# Patient Record
Sex: Male | Born: 1940 | Race: White | Hispanic: No | Marital: Married | Smoking: Former smoker
Health system: Southern US, Community
[De-identification: ages and names within clinical notes are randomized; demographics above are authoritative.]

## PROBLEM LIST (undated history)

## (undated) DIAGNOSIS — E785 Hyperlipidemia, unspecified: Secondary | ICD-10-CM

## (undated) DIAGNOSIS — I10 Essential (primary) hypertension: Secondary | ICD-10-CM

## (undated) DIAGNOSIS — E119 Type 2 diabetes mellitus without complications: Secondary | ICD-10-CM

## (undated) DIAGNOSIS — K5790 Diverticulosis of intestine, part unspecified, without perforation or abscess without bleeding: Secondary | ICD-10-CM

## (undated) HISTORY — DX: Essential (primary) hypertension: I10

## (undated) HISTORY — DX: Diverticulosis of intestine, part unspecified, without perforation or abscess without bleeding: K57.90

## (undated) HISTORY — DX: Hyperlipidemia, unspecified: E78.5

## (undated) HISTORY — PX: TONSILLECTOMY: SUR1361

## (undated) HISTORY — PX: ACHILLES TENDON SURGERY: SHX542

## (undated) HISTORY — DX: Type 2 diabetes mellitus without complications: E11.9

---

## 2003-05-14 ENCOUNTER — Encounter: Admission: RE | Admit: 2003-05-14 | Discharge: 2003-08-12 | Payer: Self-pay | Admitting: Family Medicine

## 2003-07-30 ENCOUNTER — Encounter: Admission: RE | Admit: 2003-07-30 | Discharge: 2003-10-28 | Payer: Self-pay | Admitting: Family Medicine

## 2006-05-11 ENCOUNTER — Ambulatory Visit: Payer: Self-pay | Admitting: Family Medicine

## 2006-06-20 ENCOUNTER — Ambulatory Visit: Payer: Self-pay | Admitting: Family Medicine

## 2006-06-20 ENCOUNTER — Ambulatory Visit: Payer: Self-pay | Admitting: Gastroenterology

## 2006-06-30 ENCOUNTER — Ambulatory Visit: Payer: Self-pay | Admitting: Gastroenterology

## 2006-06-30 ENCOUNTER — Encounter (INDEPENDENT_AMBULATORY_CARE_PROVIDER_SITE_OTHER): Payer: Self-pay | Admitting: *Deleted

## 2006-08-17 ENCOUNTER — Ambulatory Visit: Payer: Self-pay | Admitting: Family Medicine

## 2006-10-18 ENCOUNTER — Ambulatory Visit: Payer: Self-pay | Admitting: Family Medicine

## 2006-10-18 LAB — CONVERTED CEMR LAB
ALT: 34 units/L (ref 0–40)
AST: 34 units/L (ref 0–37)
CO2: 26 meq/L (ref 19–32)
Calcium: 9.3 mg/dL (ref 8.4–10.5)
Chloride: 105 meq/L (ref 96–112)
Chol/HDL Ratio, serum: 3.7
Glomerular Filtration Rate, Af Am: 97 mL/min/{1.73_m2}
HDL: 31.6 mg/dL — ABNORMAL LOW (ref >39.0)
Hemoglobin: 15.3 g/dL (ref 13.0–17.0)
LDL DIRECT: 60.5 mg/dL
Potassium: 3.8 meq/L (ref 3.5–5.1)
Sodium: 138 meq/L (ref 135–145)
TSH: 2.05 microintl units/mL (ref 0.35–5.50)
Testosterone, total: 1.7122 ng/mL — ABNORMAL LOW
Triglyceride fasting, serum: 201 mg/dL (ref 0–149)
VLDL: 40 mg/dL (ref 0–40)

## 2007-01-17 ENCOUNTER — Ambulatory Visit: Payer: Self-pay | Admitting: Family Medicine

## 2007-01-17 LAB — CONVERTED CEMR LAB
AST: 23 units/L (ref 0–37)
BUN: 14 mg/dL (ref 6–23)
Creatinine, Ser: 0.9 mg/dL (ref 0.4–1.5)
GFR calc non Af Amer: 90 mL/min
HDL: 37.7 mg/dL — ABNORMAL LOW (ref >39.0)
TSH: 2.43 microintl units/mL (ref 0.35–5.50)
Total CHOL/HDL Ratio: 3.2

## 2007-01-18 DIAGNOSIS — E785 Hyperlipidemia, unspecified: Secondary | ICD-10-CM

## 2007-01-18 DIAGNOSIS — I10 Essential (primary) hypertension: Secondary | ICD-10-CM

## 2007-01-18 DIAGNOSIS — K5730 Diverticulosis of large intestine without perforation or abscess without bleeding: Secondary | ICD-10-CM | POA: Insufficient documentation

## 2007-01-18 DIAGNOSIS — D126 Benign neoplasm of colon, unspecified: Secondary | ICD-10-CM

## 2007-01-18 DIAGNOSIS — E119 Type 2 diabetes mellitus without complications: Secondary | ICD-10-CM | POA: Insufficient documentation

## 2007-02-01 ENCOUNTER — Ambulatory Visit: Payer: Self-pay | Admitting: Family Medicine

## 2007-05-01 ENCOUNTER — Ambulatory Visit: Payer: Self-pay | Admitting: Family Medicine

## 2007-05-01 LAB — CONVERTED CEMR LAB
AST: 21 units/L (ref 0–37)
Calcium: 9 mg/dL (ref 8.4–10.5)
Chloride: 102 meq/L (ref 96–112)
Cholesterol: 109 mg/dL (ref 0–200)
Creatinine, Ser: 1 mg/dL (ref 0.4–1.5)
Creatinine,U: 119.2 mg/dL
GFR calc non Af Amer: 80 mL/min
Hgb A1c MFr Bld: 5.7 % (ref 4.6–6.0)
Microalb, Ur: 0.2 mg/dL (ref 0.0–1.9)
Potassium: 4.3 meq/L (ref 3.5–5.1)
Sodium: 142 meq/L (ref 135–145)

## 2007-05-08 ENCOUNTER — Ambulatory Visit: Payer: Self-pay | Admitting: Family Medicine

## 2007-06-08 ENCOUNTER — Ambulatory Visit: Payer: Self-pay | Admitting: Family Medicine

## 2007-06-13 ENCOUNTER — Encounter (INDEPENDENT_AMBULATORY_CARE_PROVIDER_SITE_OTHER): Payer: Self-pay | Admitting: Family Medicine

## 2007-06-21 ENCOUNTER — Ambulatory Visit: Payer: Self-pay | Admitting: Family Medicine

## 2007-06-22 ENCOUNTER — Telehealth (INDEPENDENT_AMBULATORY_CARE_PROVIDER_SITE_OTHER): Payer: Self-pay | Admitting: *Deleted

## 2007-06-22 LAB — CONVERTED CEMR LAB
BUN: 13 mg/dL (ref 6–23)
CO2: 27 meq/L (ref 19–32)
Calcium: 9.2 mg/dL (ref 8.4–10.5)
Chloride: 106 meq/L (ref 96–112)
GFR calc non Af Amer: 103 mL/min
Glucose, Bld: 149 mg/dL — ABNORMAL HIGH (ref 70–99)
Potassium: 4.1 meq/L (ref 3.5–5.1)
Sodium: 140 meq/L (ref 135–145)

## 2007-08-18 ENCOUNTER — Ambulatory Visit: Payer: Self-pay | Admitting: Family Medicine

## 2007-09-12 ENCOUNTER — Encounter (INDEPENDENT_AMBULATORY_CARE_PROVIDER_SITE_OTHER): Payer: Self-pay | Admitting: Family Medicine

## 2007-09-13 ENCOUNTER — Telehealth (INDEPENDENT_AMBULATORY_CARE_PROVIDER_SITE_OTHER): Payer: Self-pay | Admitting: *Deleted

## 2007-09-21 ENCOUNTER — Telehealth (INDEPENDENT_AMBULATORY_CARE_PROVIDER_SITE_OTHER): Payer: Self-pay | Admitting: *Deleted

## 2007-09-21 ENCOUNTER — Ambulatory Visit: Payer: Self-pay | Admitting: Family Medicine

## 2007-09-25 ENCOUNTER — Telehealth (INDEPENDENT_AMBULATORY_CARE_PROVIDER_SITE_OTHER): Payer: Self-pay | Admitting: *Deleted

## 2007-09-28 ENCOUNTER — Encounter (INDEPENDENT_AMBULATORY_CARE_PROVIDER_SITE_OTHER): Payer: Self-pay | Admitting: Family Medicine

## 2007-10-02 ENCOUNTER — Encounter (INDEPENDENT_AMBULATORY_CARE_PROVIDER_SITE_OTHER): Payer: Self-pay | Admitting: Family Medicine

## 2007-11-01 ENCOUNTER — Ambulatory Visit (HOSPITAL_COMMUNITY): Admission: RE | Admit: 2007-11-01 | Discharge: 2007-11-02 | Payer: Self-pay | Admitting: Specialist

## 2007-11-06 ENCOUNTER — Telehealth (INDEPENDENT_AMBULATORY_CARE_PROVIDER_SITE_OTHER): Payer: Self-pay | Admitting: Family Medicine

## 2007-11-08 ENCOUNTER — Ambulatory Visit: Payer: Self-pay | Admitting: Family Medicine

## 2007-11-08 ENCOUNTER — Telehealth (INDEPENDENT_AMBULATORY_CARE_PROVIDER_SITE_OTHER): Payer: Self-pay | Admitting: *Deleted

## 2007-11-10 ENCOUNTER — Telehealth (INDEPENDENT_AMBULATORY_CARE_PROVIDER_SITE_OTHER): Payer: Self-pay | Admitting: *Deleted

## 2007-11-10 LAB — CONVERTED CEMR LAB
Chloride: 101 meq/L (ref 96–112)
Cholesterol: 110 mg/dL (ref 0–200)
Creatinine, Ser: 1 mg/dL (ref 0.4–1.5)
Glucose, Bld: 152 mg/dL — ABNORMAL HIGH (ref 70–99)
LDL Cholesterol: 50 mg/dL (ref 0–99)
Potassium: 4.1 meq/L (ref 3.5–5.1)
Sodium: 139 meq/L (ref 135–145)
VLDL: 30 mg/dL (ref 0–40)

## 2007-11-13 ENCOUNTER — Ambulatory Visit: Payer: Self-pay | Admitting: Family Medicine

## 2007-11-15 ENCOUNTER — Ambulatory Visit: Payer: Self-pay | Admitting: Family Medicine

## 2007-11-23 ENCOUNTER — Telehealth (INDEPENDENT_AMBULATORY_CARE_PROVIDER_SITE_OTHER): Payer: Self-pay | Admitting: *Deleted

## 2007-11-27 ENCOUNTER — Telehealth (INDEPENDENT_AMBULATORY_CARE_PROVIDER_SITE_OTHER): Payer: Self-pay | Admitting: Family Medicine

## 2007-11-27 ENCOUNTER — Encounter (INDEPENDENT_AMBULATORY_CARE_PROVIDER_SITE_OTHER): Payer: Self-pay | Admitting: Family Medicine

## 2007-11-30 ENCOUNTER — Ambulatory Visit: Payer: Self-pay | Admitting: Family Medicine

## 2007-12-08 ENCOUNTER — Encounter (INDEPENDENT_AMBULATORY_CARE_PROVIDER_SITE_OTHER): Payer: Self-pay | Admitting: Family Medicine

## 2007-12-12 ENCOUNTER — Telehealth (INDEPENDENT_AMBULATORY_CARE_PROVIDER_SITE_OTHER): Payer: Self-pay | Admitting: *Deleted

## 2007-12-18 ENCOUNTER — Ambulatory Visit: Payer: Self-pay | Admitting: Family Medicine

## 2007-12-23 ENCOUNTER — Encounter (INDEPENDENT_AMBULATORY_CARE_PROVIDER_SITE_OTHER): Payer: Self-pay | Admitting: Family Medicine

## 2007-12-25 ENCOUNTER — Telehealth (INDEPENDENT_AMBULATORY_CARE_PROVIDER_SITE_OTHER): Payer: Self-pay | Admitting: *Deleted

## 2007-12-26 ENCOUNTER — Encounter (INDEPENDENT_AMBULATORY_CARE_PROVIDER_SITE_OTHER): Payer: Self-pay | Admitting: *Deleted

## 2008-01-16 ENCOUNTER — Encounter: Payer: Self-pay | Admitting: Family Medicine

## 2008-04-03 ENCOUNTER — Encounter (INDEPENDENT_AMBULATORY_CARE_PROVIDER_SITE_OTHER): Payer: Self-pay | Admitting: Internal Medicine

## 2008-04-03 ENCOUNTER — Telehealth (INDEPENDENT_AMBULATORY_CARE_PROVIDER_SITE_OTHER): Payer: Self-pay | Admitting: *Deleted

## 2008-04-05 ENCOUNTER — Encounter: Payer: Self-pay | Admitting: Family Medicine

## 2008-05-27 ENCOUNTER — Encounter: Payer: Self-pay | Admitting: Family Medicine

## 2008-06-04 ENCOUNTER — Telehealth (INDEPENDENT_AMBULATORY_CARE_PROVIDER_SITE_OTHER): Payer: Self-pay | Admitting: *Deleted

## 2008-06-05 ENCOUNTER — Telehealth (INDEPENDENT_AMBULATORY_CARE_PROVIDER_SITE_OTHER): Payer: Self-pay | Admitting: *Deleted

## 2008-06-07 ENCOUNTER — Telehealth (INDEPENDENT_AMBULATORY_CARE_PROVIDER_SITE_OTHER): Payer: Self-pay | Admitting: *Deleted

## 2008-06-11 ENCOUNTER — Telehealth (INDEPENDENT_AMBULATORY_CARE_PROVIDER_SITE_OTHER): Payer: Self-pay | Admitting: *Deleted

## 2008-06-11 ENCOUNTER — Ambulatory Visit: Payer: Self-pay | Admitting: Family Medicine

## 2008-06-11 DIAGNOSIS — J018 Other acute sinusitis: Secondary | ICD-10-CM

## 2008-07-15 ENCOUNTER — Telehealth (INDEPENDENT_AMBULATORY_CARE_PROVIDER_SITE_OTHER): Payer: Self-pay | Admitting: *Deleted

## 2008-08-02 ENCOUNTER — Ambulatory Visit: Payer: Self-pay | Admitting: Family Medicine

## 2008-08-02 DIAGNOSIS — M545 Low back pain: Secondary | ICD-10-CM

## 2008-08-02 DIAGNOSIS — S139XXA Sprain of joints and ligaments of unspecified parts of neck, initial encounter: Secondary | ICD-10-CM

## 2008-09-10 ENCOUNTER — Ambulatory Visit: Payer: Self-pay | Admitting: Family Medicine

## 2008-09-11 ENCOUNTER — Encounter: Payer: Self-pay | Admitting: Family Medicine

## 2008-09-24 ENCOUNTER — Encounter: Payer: Self-pay | Admitting: Family Medicine

## 2008-09-26 ENCOUNTER — Telehealth (INDEPENDENT_AMBULATORY_CARE_PROVIDER_SITE_OTHER): Payer: Self-pay | Admitting: *Deleted

## 2008-09-29 LAB — CONVERTED CEMR LAB
Albumin: 4.2 g/dL (ref 3.5–5.2)
Alkaline Phosphatase: 65 units/L (ref 39–117)
BUN: 15 mg/dL (ref 6–23)
Basophils Relative: 0.1 % (ref 0.0–3.0)
CO2: 30 meq/L (ref 19–32)
Calcium: 9.6 mg/dL (ref 8.4–10.5)
Eosinophils Absolute: 0.3 10*3/uL (ref 0.0–0.7)
Eosinophils Relative: 3.2 % (ref 0.0–5.0)
GFR calc Af Amer: 96 mL/min
GFR calc non Af Amer: 79 mL/min
Glucose, Bld: 104 mg/dL — ABNORMAL HIGH (ref 70–99)
HCT: 45.9 % (ref 39.0–52.0)
Hemoglobin: 16.2 g/dL (ref 13.0–17.0)
Lymphocytes Relative: 27.4 % (ref 12.0–46.0)
Monocytes Absolute: 1 10*3/uL (ref 0.1–1.0)
Monocytes Relative: 9.7 % (ref 3.0–12.0)
PSA: 3.82 ng/mL (ref 0.10–4.00)
Platelets: 206 10*3/uL (ref 150–400)
Potassium: 3.7 meq/L (ref 3.5–5.1)
Total Protein: 7.4 g/dL (ref 6.0–8.3)

## 2008-10-01 ENCOUNTER — Encounter (INDEPENDENT_AMBULATORY_CARE_PROVIDER_SITE_OTHER): Payer: Self-pay | Admitting: *Deleted

## 2008-10-02 ENCOUNTER — Telehealth (INDEPENDENT_AMBULATORY_CARE_PROVIDER_SITE_OTHER): Payer: Self-pay | Admitting: *Deleted

## 2008-11-08 ENCOUNTER — Telehealth (INDEPENDENT_AMBULATORY_CARE_PROVIDER_SITE_OTHER): Payer: Self-pay | Admitting: *Deleted

## 2008-12-25 ENCOUNTER — Telehealth (INDEPENDENT_AMBULATORY_CARE_PROVIDER_SITE_OTHER): Payer: Self-pay | Admitting: *Deleted

## 2008-12-26 ENCOUNTER — Telehealth (INDEPENDENT_AMBULATORY_CARE_PROVIDER_SITE_OTHER): Payer: Self-pay | Admitting: *Deleted

## 2009-02-11 ENCOUNTER — Telehealth (INDEPENDENT_AMBULATORY_CARE_PROVIDER_SITE_OTHER): Payer: Self-pay | Admitting: *Deleted

## 2009-02-27 ENCOUNTER — Telehealth (INDEPENDENT_AMBULATORY_CARE_PROVIDER_SITE_OTHER): Payer: Self-pay | Admitting: *Deleted

## 2009-03-18 ENCOUNTER — Encounter: Payer: Self-pay | Admitting: Family Medicine

## 2009-04-08 ENCOUNTER — Telehealth (INDEPENDENT_AMBULATORY_CARE_PROVIDER_SITE_OTHER): Payer: Self-pay | Admitting: *Deleted

## 2009-05-01 ENCOUNTER — Telehealth (INDEPENDENT_AMBULATORY_CARE_PROVIDER_SITE_OTHER): Payer: Self-pay | Admitting: *Deleted

## 2009-05-19 ENCOUNTER — Encounter: Payer: Self-pay | Admitting: Family Medicine

## 2009-05-28 ENCOUNTER — Telehealth (INDEPENDENT_AMBULATORY_CARE_PROVIDER_SITE_OTHER): Payer: Self-pay | Admitting: *Deleted

## 2009-07-17 ENCOUNTER — Telehealth (INDEPENDENT_AMBULATORY_CARE_PROVIDER_SITE_OTHER): Payer: Self-pay | Admitting: *Deleted

## 2009-07-18 ENCOUNTER — Ambulatory Visit: Payer: Self-pay | Admitting: Family Medicine

## 2009-09-17 ENCOUNTER — Telehealth (INDEPENDENT_AMBULATORY_CARE_PROVIDER_SITE_OTHER): Payer: Self-pay | Admitting: *Deleted

## 2009-09-18 ENCOUNTER — Encounter: Payer: Self-pay | Admitting: Family Medicine

## 2009-09-22 ENCOUNTER — Telehealth (INDEPENDENT_AMBULATORY_CARE_PROVIDER_SITE_OTHER): Payer: Self-pay | Admitting: *Deleted

## 2009-10-31 ENCOUNTER — Telehealth: Payer: Self-pay | Admitting: Family Medicine

## 2009-11-05 ENCOUNTER — Telehealth (INDEPENDENT_AMBULATORY_CARE_PROVIDER_SITE_OTHER): Payer: Self-pay | Admitting: *Deleted

## 2009-11-27 ENCOUNTER — Ambulatory Visit: Payer: Self-pay | Admitting: Family Medicine

## 2009-11-30 DIAGNOSIS — R972 Elevated prostate specific antigen [PSA]: Secondary | ICD-10-CM | POA: Insufficient documentation

## 2009-12-01 ENCOUNTER — Encounter (INDEPENDENT_AMBULATORY_CARE_PROVIDER_SITE_OTHER): Payer: Self-pay | Admitting: *Deleted

## 2009-12-01 ENCOUNTER — Telehealth (INDEPENDENT_AMBULATORY_CARE_PROVIDER_SITE_OTHER): Payer: Self-pay | Admitting: *Deleted

## 2009-12-05 ENCOUNTER — Telehealth: Payer: Self-pay | Admitting: Family Medicine

## 2009-12-25 ENCOUNTER — Encounter: Payer: Self-pay | Admitting: Family Medicine

## 2009-12-26 ENCOUNTER — Telehealth (INDEPENDENT_AMBULATORY_CARE_PROVIDER_SITE_OTHER): Payer: Self-pay | Admitting: *Deleted

## 2010-01-05 ENCOUNTER — Telehealth (INDEPENDENT_AMBULATORY_CARE_PROVIDER_SITE_OTHER): Payer: Self-pay | Admitting: *Deleted

## 2010-01-08 ENCOUNTER — Encounter: Payer: Self-pay | Admitting: Family Medicine

## 2010-02-24 ENCOUNTER — Telehealth (INDEPENDENT_AMBULATORY_CARE_PROVIDER_SITE_OTHER): Payer: Self-pay | Admitting: *Deleted

## 2010-02-26 ENCOUNTER — Telehealth (INDEPENDENT_AMBULATORY_CARE_PROVIDER_SITE_OTHER): Payer: Self-pay | Admitting: *Deleted

## 2010-03-02 ENCOUNTER — Telehealth (INDEPENDENT_AMBULATORY_CARE_PROVIDER_SITE_OTHER): Payer: Self-pay | Admitting: *Deleted

## 2010-03-12 ENCOUNTER — Ambulatory Visit: Payer: Self-pay | Admitting: Family Medicine

## 2010-03-16 ENCOUNTER — Encounter: Payer: Self-pay | Admitting: Family Medicine

## 2010-03-30 ENCOUNTER — Telehealth (INDEPENDENT_AMBULATORY_CARE_PROVIDER_SITE_OTHER): Payer: Self-pay | Admitting: *Deleted

## 2010-04-03 ENCOUNTER — Encounter (INDEPENDENT_AMBULATORY_CARE_PROVIDER_SITE_OTHER): Payer: Self-pay | Admitting: *Deleted

## 2010-04-10 ENCOUNTER — Telehealth (INDEPENDENT_AMBULATORY_CARE_PROVIDER_SITE_OTHER): Payer: Self-pay | Admitting: *Deleted

## 2010-04-23 ENCOUNTER — Ambulatory Visit: Payer: Self-pay | Admitting: Family Medicine

## 2010-04-23 DIAGNOSIS — R0989 Other specified symptoms and signs involving the circulatory and respiratory systems: Secondary | ICD-10-CM | POA: Insufficient documentation

## 2010-04-23 DIAGNOSIS — G4710 Hypersomnia, unspecified: Secondary | ICD-10-CM | POA: Insufficient documentation

## 2010-04-23 DIAGNOSIS — R0609 Other forms of dyspnea: Secondary | ICD-10-CM | POA: Insufficient documentation

## 2010-05-18 ENCOUNTER — Telehealth: Payer: Self-pay | Admitting: Family Medicine

## 2010-05-28 ENCOUNTER — Ambulatory Visit: Payer: Self-pay | Admitting: Pulmonary Disease

## 2010-05-28 DIAGNOSIS — G4733 Obstructive sleep apnea (adult) (pediatric): Secondary | ICD-10-CM | POA: Insufficient documentation

## 2010-05-29 ENCOUNTER — Telehealth: Payer: Self-pay | Admitting: Family Medicine

## 2010-06-18 ENCOUNTER — Encounter: Payer: Self-pay | Admitting: Family Medicine

## 2010-06-24 ENCOUNTER — Encounter: Payer: Self-pay | Admitting: Pulmonary Disease

## 2010-06-24 ENCOUNTER — Ambulatory Visit (HOSPITAL_BASED_OUTPATIENT_CLINIC_OR_DEPARTMENT_OTHER): Admission: RE | Admit: 2010-06-24 | Discharge: 2010-06-24 | Payer: Self-pay | Admitting: Pulmonary Disease

## 2010-07-07 ENCOUNTER — Telehealth (INDEPENDENT_AMBULATORY_CARE_PROVIDER_SITE_OTHER): Payer: Self-pay | Admitting: *Deleted

## 2010-07-10 ENCOUNTER — Telehealth (INDEPENDENT_AMBULATORY_CARE_PROVIDER_SITE_OTHER): Payer: Self-pay | Admitting: *Deleted

## 2010-07-10 ENCOUNTER — Ambulatory Visit: Payer: Self-pay | Admitting: Pulmonary Disease

## 2010-07-16 ENCOUNTER — Ambulatory Visit: Payer: Self-pay | Admitting: Pulmonary Disease

## 2010-07-23 ENCOUNTER — Ambulatory Visit: Payer: Self-pay | Admitting: Family Medicine

## 2010-07-29 LAB — CONVERTED CEMR LAB
ALT: 31 units/L (ref 0–53)
Chloride: 102 meq/L (ref 96–112)
Cholesterol: 135 mg/dL (ref 0–200)
GFR calc non Af Amer: 84.71 mL/min (ref >60)
HDL: 33.3 mg/dL — ABNORMAL LOW (ref >39.00)
Hgb A1c MFr Bld: 5.9 % (ref 4.6–6.5)
LDL Cholesterol: 68 mg/dL (ref 0–99)
Potassium: 3.9 meq/L (ref 3.5–5.1)
Sodium: 137 meq/L (ref 135–145)
Total Bilirubin: 1.3 mg/dL — ABNORMAL HIGH (ref 0.3–1.2)
Total Protein: 6.6 g/dL (ref 6.0–8.3)
VLDL: 33.8 mg/dL (ref 0.0–40.0)

## 2010-08-17 ENCOUNTER — Telehealth (INDEPENDENT_AMBULATORY_CARE_PROVIDER_SITE_OTHER): Payer: Self-pay | Admitting: *Deleted

## 2010-08-20 ENCOUNTER — Ambulatory Visit: Payer: Self-pay | Admitting: Pulmonary Disease

## 2010-08-21 ENCOUNTER — Telehealth (INDEPENDENT_AMBULATORY_CARE_PROVIDER_SITE_OTHER): Payer: Self-pay | Admitting: *Deleted

## 2010-08-21 ENCOUNTER — Encounter: Payer: Self-pay | Admitting: Pulmonary Disease

## 2010-10-02 ENCOUNTER — Telehealth (INDEPENDENT_AMBULATORY_CARE_PROVIDER_SITE_OTHER): Payer: Self-pay | Admitting: *Deleted

## 2010-11-01 ENCOUNTER — Encounter: Payer: Self-pay | Admitting: Interventional Cardiology

## 2010-11-05 ENCOUNTER — Telehealth (INDEPENDENT_AMBULATORY_CARE_PROVIDER_SITE_OTHER): Payer: Self-pay | Admitting: *Deleted

## 2010-11-08 LAB — CONVERTED CEMR LAB
ALT: 29 U/L
AST: 26 U/L
Albumin: 3.9 g/dL (ref 3.5–5.2)
Albumin: 4 g/dL
Alkaline Phosphatase: 65 U/L
Alkaline Phosphatase: 66 units/L (ref 39–117)
BUN: 10 mg/dL
Basophils Absolute: 0 K/uL
Basophils Relative: 0.6 %
Bilirubin Urine: NEGATIVE
Bilirubin, Direct: 0.1 mg/dL
CO2: 30 meq/L
Calcium: 9.1 mg/dL
Chloride: 106 meq/L
Cholesterol: 136 mg/dL
Creatinine, Ser: 0.9 mg/dL
Eosinophils Absolute: 0.2 K/uL
Eosinophils Relative: 3.2 %
GFR calc non Af Amer: 89.24 mL/min
Glucose, Bld: 116 mg/dL — ABNORMAL HIGH
HCT: 43.3 %
HDL: 40.4 mg/dL
Hemoglobin: 14.4 g/dL
LDL Cholesterol: 65 mg/dL
Lymphocytes Relative: 28.1 %
Lymphs Abs: 2 K/uL
MCHC: 33.2 g/dL
MCV: 100.1 fL — ABNORMAL HIGH
Monocytes Absolute: 0.6 K/uL
Monocytes Relative: 8.8 %
Neutro Abs: 4.3 K/uL
Neutrophils Relative %: 59.3 %
PSA: 4.77 ng/mL — ABNORMAL HIGH
Platelets: 204 K/uL
Potassium: 4.1 meq/L
Protein, U semiquant: NEGATIVE
RBC: 4.33 M/uL
RDW: 12.8 %
Sodium: 143 meq/L
Specific Gravity, Urine: 1.01
Total Bilirubin: 1 mg/dL (ref 0.3–1.2)
Total Bilirubin: 1.4 mg/dL — ABNORMAL HIGH
Total CHOL/HDL Ratio: 3
Total Protein: 7 g/dL
Triglycerides: 153 mg/dL — ABNORMAL HIGH
VLDL: 30.6 mg/dL
WBC: 7.1 10*3/microliter

## 2010-11-09 ENCOUNTER — Telehealth (INDEPENDENT_AMBULATORY_CARE_PROVIDER_SITE_OTHER): Payer: Self-pay | Admitting: *Deleted

## 2010-11-12 NOTE — Progress Notes (Signed)
Summary: med concerns  Phone Note Call from Patient   Caller: Patient Summary of Call: Pt called and is wondering on why he needs to start this medication he states that last year his TG were 169, and this year they are 153. Please advise. Would like Dr.Lowne's advice. Army Fossa CMA  December 05, 2009 2:21 PM   Follow-up for Phone Call        because they are still high--- we can wait 3 more months and see if they come down more----just con't current meds and recheck 3 months Follow-up by: Loreen Freud DO,  December 05, 2009 3:03 PM  Additional Follow-up for Phone Call Additional follow up Details #1::        Pt is aware- he is going to check labs in 3 months.

## 2010-11-12 NOTE — Assessment & Plan Note (Signed)
Summary: rov for review of npsg   Visit Type:  Follow-up Copy to:  Loreen Freud Primary Provider/Referring Provider:  Loreen Freud DO  CC:  pt here to discuss sleep study results..  History of Present Illness: The pt comes in today for f/u of his recent sleep study.  He was found to have severe osa with AHI of 44/hr and desat as low as 82%.  I have reviewed the study with him in detail, and answered all of his questions.  Current Medications (verified): 1)  Niaspan 1000 Mg Cr-Tabs (Niacin (Antihyperlipidemic)) .Marland Kitchen.. 1 By Mouth Daily. 2)  Glucophage Xr 500 Mg Xr24h-Tab (Metformin Hcl) .... 2 By Mouth Two Times A Day 3)  Glipizide 5 Mg Xr24h-Tab (Glipizide) .Marland Kitchen.. 1 By Mouth Once Daily. 4)  Triamterene-Hctz 37.5-25 Mg  Caps (Triamterene-Hctz) .... Take One Tablet Daily 5)  Baby Aspirin 81 Mg  Chew (Aspirin) .... Take One Tablet Every Other Day 6)  Simvastatin 40 Mg  Tabs (Simvastatin) .... Take One Tablet Every Other Day Pt Due For Labs in June 7)  Lantus Solostar 100 Unit/ml  Soln (Insulin Glargine) .... Use 40  Units Daily 8)  Bd U/f Short Pen Needle 31g X 8 Mm  Misc (Insulin Pen Needle) .... Use As Directed Once Daily 9)  Cvs Ibuprofen Ib 200 Mg Tabs (Ibuprofen) .... Take 1 Tab Once Daily 10)  One Touch Ultra Test Strips .... Checks Bs Two Times A Day Dx 250.00 11)  Vitamin C 500 Mg Tabs (Ascorbic Acid) .... Two Times A Day 12)  Cozaar 50 Mg Tabs (Losartan Potassium) .Marland Kitchen.. 1 By Mouth Once Daily 13)  Viagra 100 Mg Tabs (Sildenafil Citrate) .... 1/2 -1 As Directed 14)  Bd Insulin Syr Ultrafine Ii 31g X 5/16" 1 Ml Misc (Insulin Syringe-Needle U-100) .... As Directed. 15)  Fish Oil 1000 Mg Caps (Omega-3 Fatty Acids) .... 2 By Mouth Two Times A Day 16)  Olive Oil  Oil (Olive Oil)  Allergies (verified): No Known Drug Allergies  Review of Systems       The patient complains of acid heartburn and hand/feet swelling.  The patient denies shortness of breath with activity, shortness of breath at  rest, productive cough, non-productive cough, coughing up blood, chest pain, irregular heartbeats, indigestion, loss of appetite, weight change, abdominal pain, difficulty swallowing, sore throat, tooth/dental problems, headaches, nasal congestion/difficulty breathing through nose, sneezing, itching, ear ache, anxiety, depression, joint stiffness or pain, rash, change in color of mucus, and fever.    Vital Signs:  Patient profile:   70 year old male Height:      71.5 inches Weight:      260 pounds BMI:     35.89 O2 Sat:      94 % on Room air Temp:     97.9 degrees F oral Pulse rate:   86 / minute BP sitting:   138 / 78  (left arm) Cuff size:   large  Vitals Entered By: Carver Fila (July 16, 2010 10:13 AM)  O2 Flow:  Room air CC: pt here to discuss sleep study results. Comments meds and allergies updated Phone number updated Mindy Edward Jolly  July 16, 2010 10:14 AM    Physical Exam  General:  overweight male in nad Nose:  septal deviation to left Extremities:  no significant edema or cyanosis Neurologic:  alert and oriented, does not appear sleepy moves all 4.   Impression & Recommendations:  Problem # 1:  OBSTRUCTIVE SLEEP APNEA (ICD-327.23)  the pt has severe osa by his recent sleep study, and would be best served by cpap while trying to work on weight loss.  I have also reviewed the other treatment options such as surgery and dental appliance, but explained these are unlikely to work at this severity of osa.  The pt is willing to try cpap.  I will set the patient up on cpap at a moderate pressure level to allow for desensitization, and will troubleshoot the device over the next 4-6weeks if needed.  The pt is to call me if having issues with tolerance.  Will then optimize the pressure once patient is able to wear cpap on a consistent basis.  Medications Added to Medication List This Visit: 1)  Glucophage Xr 500 Mg Xr24h-tab (Metformin hcl) .... 2 by mouth two times a day 2)   Fish Oil 1000 Mg Caps (Omega-3 fatty acids) .... 2 by mouth two times a day  Other Orders: Est. Patient Level III (40981) DME Referral (DME)  Patient Instructions: 1)  will start on cpap at a moderate pressure level...please call if having issues with tolerance. 2)  work on weight loss 3)  followup with me in 4-5 weeks.

## 2010-11-12 NOTE — Progress Notes (Signed)
Summary: REFILL REQUEST  Phone Note Refill Request Call back at [phone] Message from:  Pharmacy on July 07, 2010 9:12 AM  Refills Requested: Medication #1:  GLIPIZIDE 5 MG XR24H-TAB 1 by mouth once daily.   Dosage confirmed as above?Dosage Confirmed   Supply Requested: 3 months [facility]  Next Appointment Scheduled: 07/23/10 Initial call taken by: Lavell Islam,  July 07, 2010 9:13 AM    Prescriptions: GLIPIZIDE 5 MG XR24H-TAB (GLIPIZIDE) 1 by mouth once daily.  #90 x 0   Entered by:   Almeta Monas CMA (AAMA)   Authorized by:   Loreen Freud DO   Signed by:   Almeta Monas CMA (AAMA) on 07/07/2010   Method used:   Faxed to ...       [facility] (mail-order)             , [place]         Ph: [phone]       Fax: [phone]   RxID:   [id number]

## 2010-11-12 NOTE — Progress Notes (Signed)
Summary: Refill Requests  Phone Note Refill Request Message from:  Pharmacy on [facility] Fax #: [phone]  Refills Requested: Medication #1:  NIASPAN 500 MG  TBCR Take one tablet daily**LABS DUE NOW**   Dosage confirmed as above?Dosage Confirmed   Supply Requested: 3 months  Medication #2:  TRIAMTERENE-HCTZ 37.5-25 MG  CAPS Take one tablet daily   Dosage confirmed as above?Dosage Confirmed   Supply Requested: 3 months Next Appointment Scheduled: none Initial call taken by: Harold Barban,  December 26, 2009 11:26 AM    Prescriptions: TRIAMTERENE-HCTZ 37.5-25 MG  CAPS (TRIAMTERENE-HCTZ) Take one tablet daily  #90 x 0   Entered by:   Army Fossa CMA   Authorized by:   Loreen Freud DO   Signed by:   Army Fossa CMA on 12/26/2009   Method used:   Electronically to        [facility] (retail)       [address] [place], [place]  [address]       Ph: [phone]       Fax: [phone]   RxID:   [id number] NIASPAN 500 MG  TBCR (NIACIN (ANTIHYPERLIPIDEMIC)) Take one tablet daily**LABS DUE NOW**  #90 x 0   Entered by:   Army Fossa CMA   Authorized by:   Loreen Freud DO   Signed by:   Army Fossa CMA on 12/26/2009   Method used:   Electronically to        [facility] (retail)       [address]       [place], [place]  [address]       Ph: [phone]       Fax: [phone]   RxID:   [id number]

## 2010-11-12 NOTE — Progress Notes (Signed)
Summary: Refill Request  Phone Note Refill Request Call back at [phone] Message from:  Pharmacy on Feb 26, 2010 2:14 PM  Refills Requested: Medication #1:  SIMVASTATIN 40 MG  TABS Take one tablet every other day pt due for labs in [place]   Dosage confirmed as above?Dosage Confirmed   Supply Requested: 3 months [facility]  Next Appointment Scheduled: none Initial call taken by: Harold Barban,  Feb 26, 2010 2:14 PM    Prescriptions: SIMVASTATIN 40 MG  TABS (SIMVASTATIN) Take one tablet every other day pt due for labs in [place]  #90 x 0   Entered by:   Army Fossa CMA   Authorized by:   Loreen Freud DO   Signed by:   Army Fossa CMA on 02/26/2010   Method used:   Faxed to ...       [facility] (mail-order)       [address]       [place], [place]  [id number]       Ph: [phone]       Fax: [phone]   RxID:   [id number]

## 2010-11-12 NOTE — Assessment & Plan Note (Signed)
Summary: consult for possible osa   Copy to:  Loreen Freud Primary Provider/Referring Provider:  Loreen Freud DO  CC:  Sleep Consult.  History of Present Illness: The pt is a 70y/o male who I have been asked to see for possible osa.  He has been noted to have loud snoring, as well as an abnormal breathing pattern with gasping during sleep.  He goes to bed at 10pm, and arises at 6:30 am to start his day.  He thinks he feels rested upon arising, and does fairly well until after lunch.  He then begins to feel sleep pressure, and will take a afternoon nap daily.  He admits to dozing with watching tv and movies, and has some sleepiness with driving longer distances.  He is most concerned about his decreasing daytime energy.  His epworth score today is mildly abnormal at 11.  The pt states that his weight is neutral over the past 2 years.  Current Medications (verified): 1)  Niaspan 1000 Mg Cr-Tabs (Niacin (Antihyperlipidemic)) .Marland Kitchen.. 1 By Mouth Daily. 2)  Glucophage Xr 500 Mg Xr24h-Tab (Metformin Hcl) .Marland Kitchen.. 1 By Mouth Two Times A Day 3)  Glipizide 5 Mg Xr24h-Tab (Glipizide) .Marland Kitchen.. 1 By Mouth Once Daily. 4)  Triamterene-Hctz 37.5-25 Mg  Caps (Triamterene-Hctz) .... Take One Tablet Daily 5)  Baby Aspirin 81 Mg  Chew (Aspirin) .... Take One Tablet Every Other Day 6)  Simvastatin 40 Mg  Tabs (Simvastatin) .... Take One Tablet Every Other Day Pt Due For Labs in June 7)  Lantus Solostar 100 Unit/ml  Soln (Insulin Glargine) .... Use 40  Units Daily 8)  Bd U/f Short Pen Needle 31g X 8 Mm  Misc (Insulin Pen Needle) .... Use As Directed Once Daily 9)  Cvs Ibuprofen Ib 200 Mg Tabs (Ibuprofen) .... Take 1 Tab Once Daily 10)  One Touch Ultra Test Strips .... Checks Bs Two Times A Day Dx 250.00 11)  Vitamin C 500 Mg Tabs (Ascorbic Acid) .... Two Times A Day 12)  Cozaar 50 Mg Tabs (Losartan Potassium) .Marland Kitchen.. 1 By Mouth Once Daily 13)  Levitra 10 Mg Tabs (Vardenafil Hcl) .... Take As Directed 14)  Bd Insulin Syr  Ultrafine Ii 31g X 5/16" 1 Ml Misc (Insulin Syringe-Needle U-100) .... As Directed. 15)  Fish Oil 1000 Mg Caps (Omega-3 Fatty Acids) .... 2 By Mouth Two Times A Day 16)  Olive Oil  Oil (Olive Oil)  Allergies (verified): No Known Drug Allergies  Past History:  Past Medical History: Reviewed history from 01/18/2007 and no changes required. Diabetes mellitus, type II Hyperlipidemia Hypertension Diverticulosis, colon  Past Surgical History: left foot surgery--achilles tendon tonsillectomy  Family History: Reviewed history from 09/10/2008 and no changes required. F--cancer? 69yo M--heart diease  Family History of Cervical cancer  Social History: Reviewed history from 09/10/2008 and no changes required. Retired--Ins adjuster Married former smoker.  started in "late teens"  1 pack per 3 days.  quit 1986. Alcohol use-yes Drug use-no Regular exercise-yes  Review of Systems       The patient complains of shortness of breath with activity.  The patient denies shortness of breath at rest, productive cough, non-productive cough, coughing up blood, chest pain, irregular heartbeats, acid heartburn, indigestion, loss of appetite, weight change, abdominal pain, difficulty swallowing, sore throat, tooth/dental problems, headaches, nasal congestion/difficulty breathing through nose, sneezing, itching, ear ache, anxiety, depression, hand/feet swelling, joint stiffness or pain, rash, change in color of mucus, and fever.    Vital Signs:  Patient  profile:   70 year old male Height:      71.5 inches Weight:      256 pounds BMI:     35.33 O2 Sat:      93 % on Room air Temp:     98.4 degrees F oral Pulse rate:   100 / minute BP sitting:   150 / 70  (left arm) Cuff size:   large  Vitals Entered By: Arman Filter LPN (May 28, 2010 2:39 PM)  O2 Flow:  Room air CC: Sleep Consult Comments Medications reviewed with patient Arman Filter LPN  May 28, 2010 2:47 PM    Physical  Exam  General:  obese male in nad Eyes:  PERRLA and EOMI.   Nose:  deviated septum to left with narrowing. Mouth:  signficant elongation of soft palate and uvula Neck:  no jvd, tmg, LN Lungs:  clear to auscultation Heart:  rrr, no mrg Abdomen:  soft and nontender, bs+ Extremities:  trace edema, no cyanosis  pulses intact distally, moves all 4. Neurologic:  alert and oriented, moves all 4.   Impression & Recommendations:  Problem # 1:  OBSTRUCTIVE SLEEP APNEA (ICD-327.23) the pt gives a history that is very suggestive of osa, is overweight, and has abnormal upper airway anatomy.  He has underlying medical conditions that can be adversely affected by sleep disordered breathing.  I have had a long discussion with the pt about sleep apnea, including its impact on QOL and CV health.  I am suspicious enough that I think he would benefit from a sleep study, and he is agreeable.  I have also encouraged him to work on weight loss.  Other Orders: Consultation Level IV ([address]40) Sleep Disorder Referral (Sleep Disorder)  Patient Instructions: 1)  will set up for sleep study, and arrange followup once results are available. 2)  work on weight loss

## 2010-11-12 NOTE — Progress Notes (Signed)
Summary: Pt needs OV with KC to discuss sleep study results.  Phone Note Outgoing Call   Call placed by: Carver Fila,  July 10, 2010 9:00 AM Call placed to: Patient Summary of Call: [facility] X[facility]. pt needs ov with KC to discuss his sleep study results.  Carver Fila  July 10, 2010 9:00 AM   Follow-up for Phone Call        made pt appt for 10/06/pt aware/cb Follow-up by: Lacinda Axon,  July 13, 2010 8:49 AM

## 2010-11-12 NOTE — Assessment & Plan Note (Signed)
Summary: discuss sleep apnea/cbs   Vital Signs:  Patient profile:   70 year old male Height:      71.5 inches Weight:      249 pounds BMI:     34.37 O2 Sat:      97 % on Room air Temp:     97.8 degrees F oral Pulse rate:   72 / minute BP sitting:   130 / 90  (left arm)  Vitals Entered By: Jeremy Johann CMA (April 23, 2010 10:28 AM)  O2 Flow:  Room air CC: DISCUSS SLEEP ISSUE Comments REVIEWED MED LIST, PATIENT AGREED DOSE AND INSTRUCTION CORRECT    History of Present Illness: Pt here to discuss sleep apnea.  Pt was on a mission trip and ran everyone out of the room because of his snoring.  His brother has sleep apnea and has a cpap machine.    Pt also c/o daytime sleepiness.      Current Medications (verified): 1)  Niaspan 1000 Mg Cr-Tabs (Niacin (Antihyperlipidemic)) .Marland Kitchen.. 1 By Mouth Daily. 2)  Glucophage Xr 500 Mg Xr24h-Tab (Metformin Hcl) .Marland Kitchen.. 1 By Mouth Two Times A Day 3)  Glipizide 5 Mg Xr24h-Tab (Glipizide) .Marland Kitchen.. 1 By Mouth Once Daily. 4)  Triamterene-Hctz 37.5-25 Mg  Caps (Triamterene-Hctz) .... Take One Tablet Daily 5)  Fish Oil Concentrate 120-180 Mg  Caps (Omega-3 Fatty Acids) 6)  Baby Aspirin 81 Mg  Chew (Aspirin) .... Take One Tablet Every Other Day 7)  Simvastatin 40 Mg  Tabs (Simvastatin) .... Take One Tablet Every Other Day Pt Due For Labs in June 8)  Lantus Solostar 100 Unit/ml  Soln (Insulin Glargine) .... Use 40  Units Daily 9)  Bd U/f Short Pen Needle 31g X 8 Mm  Misc (Insulin Pen Needle) .... Use As Directed Once Daily 10)  Cvs Ibuprofen Ib 200 Mg Tabs (Ibuprofen) .... Take 1 Tab Once Daily 11)  One Touch Ultra Test Strips .... Checks Bs Two Times A Day Dx 250.00 12)  Vitamin C 500 Mg Tabs (Ascorbic Acid) .... Two Times A Day 13)  Cozaar 50 Mg Tabs (Losartan Potassium) .Marland Kitchen.. 1 By Mouth Once Daily 14)  Levitra 10 Mg Tabs (Vardenafil Hcl) .... Take As Directed 15)  Bd Insulin Syr Ultrafine Ii 31g X 5/16" 1 Ml Misc (Insulin Syringe-Needle U-100) .... As  Directed. 16)  Fish Oil 1000 Mg Caps (Omega-3 Fatty Acids) .... 2 By Mouth Two Times A Day 17)  Olive Oil  Oil (Olive Oil)  Allergies (verified): No Known Drug Allergies  Family History: Reviewed history from 09/10/2008 and no changes required. F--cancer? 70yo M-- Family History of Cervical cancer  Social History: Reviewed history from 09/10/2008 and no changes required. Retired--Ins adjuster Married Never Smoked Alcohol use-yes Drug use-no Regular exercise-yes  Review of Systems      See HPI  Physical Exam  General:  Well-developed,well-nourished,in no acute distress; alert,appropriate and cooperative throughout examination Lungs:  Normal respiratory effort, chest expands symmetrically. Lungs are clear to auscultation, no crackles or wheezes. pulse ox 97% on RA with walking it dropped to 93% Heart:  normal rate and no murmur.   Psych:  Cognition and judgment appear intact. Alert and cooperative with normal attention span and concentration. No apparent delusions, illusions, hallucinations   Impression & Recommendations:  Problem # 1:  SNORING (ICD-786.09)  His updated medication list for this problem includes:    Triamterene-hctz 37.5-25 Mg Caps (Triamterene-hctz) .Marland Kitchen... Take one tablet daily  Orders: Sleep Disorder Referral (Sleep Disorder)  Problem # 2:  HYPERSOMNIA (ICD-780.54)  Orders: Sleep Disorder Referral (Sleep Disorder)  Problem # 3:  HYPERLIPIDEMIA (ICD-272.4)  His updated medication list for this problem includes:    Niaspan 1000 Mg Cr-tabs (Niacin (antihyperlipidemic)) .Marland Kitchen... 1 by mouth daily.    Simvastatin 40 Mg Tabs (Simvastatin) .Marland Kitchen... Take one tablet every other day pt due for labs in june  Labs Reviewed: SGOT: 26 (03/12/2010)   SGPT: 26 (03/12/2010)   HDL:40.40 (11/27/2009), 38.8 (09/10/2008)  LDL:65 (11/27/2009), 70 (09/10/2008)  Chol:136 (11/27/2009), 143 (09/10/2008)  Trig:153.0 (11/27/2009), 169 (09/10/2008)  Complete Medication  List: 1)  Niaspan 1000 Mg Cr-tabs (Niacin (antihyperlipidemic)) .Marland Kitchen.. 1 by mouth daily. 2)  Glucophage Xr 500 Mg Xr24h-tab (Metformin hcl) .Marland Kitchen.. 1 by mouth two times a day 3)  Glipizide 5 Mg Xr24h-tab (Glipizide) .Marland Kitchen.. 1 by mouth once daily. 4)  Triamterene-hctz 37.5-25 Mg Caps (Triamterene-hctz) .... Take one tablet daily 5)  Fish Oil Concentrate 120-180 Mg Caps (Omega-3 fatty acids) 6)  Baby Aspirin 81 Mg Chew (Aspirin) .... Take one tablet every other day 7)  Simvastatin 40 Mg Tabs (Simvastatin) .... Take one tablet every other day pt due for labs in june 8)  Lantus Solostar 100 Unit/ml Soln (Insulin glargine) .... Use 40  units daily 9)  Bd U/f Short Pen Needle 31g X 8 Mm Misc (Insulin pen needle) .... Use as directed once daily 10)  Cvs Ibuprofen Ib 200 Mg Tabs (Ibuprofen) .... Take 1 tab once daily 11)  One Touch Ultra Test Strips  .... Checks bs two times a day dx 250.00 12)  Vitamin C 500 Mg Tabs (Ascorbic acid) .... Two times a day 13)  Cozaar 50 Mg Tabs (Losartan potassium) .Marland Kitchen.. 1 by mouth once daily 14)  Levitra 10 Mg Tabs (Vardenafil hcl) .... Take as directed 15)  Bd Insulin Syr Ultrafine Ii 31g X 5/16" 1 Ml Misc (Insulin syringe-needle u-100) .... As directed. 16)  Fish Oil 1000 Mg Caps (Omega-3 fatty acids) .... 2 by mouth two times a day 17)  Olive Oil Oil (Olive oil)  Patient Instructions: 1)  You may add Fish Oil 2 g two times a day to decrease LDL cholesterol and increase HDL 2)  recheck labs in 3 months

## 2010-11-12 NOTE — Progress Notes (Signed)
Summary: refill  Phone Note Refill Request Message from:  [facility] fax [phone]  Refills Requested: Medication #1:  SIMVASTATIN 40 MG  TABS Take one tablet every other day pt due for labs in [place] Initial call taken by: Barb Merino,  October 31, 2009 9:36 AM  Follow-up for Phone Call        Pt has not had labs done since 09/2008.  Follow-up by: Army Fossa CMA,  October 31, 2009 10:04 AM  Additional Follow-up for Phone Call Additional follow up Details #1::        PT NEEDS LABS---THEY SHOULD BE CHECKED AT THE VERY LEAST Q6M ----USUALLY Q3M---AND HE HAS NOT HAD ANY SINCE 12/09 SCHEDULE LABS  ---UNLESS DR Talmage Nap IS CHECKING CHOLESTEROL--BUT THEN SHE SHOULD FILL CHOLESTEROL MED 272.4  LIPID, HEP Additional Follow-up by: Loreen Freud DO,  October 31, 2009 10:11 AM    Additional Follow-up for Phone Call Additional follow up Details #2::    i left a message for the pt to call back Army Fossa CMA  October 31, 2009 10:17 AM

## 2010-11-12 NOTE — Progress Notes (Signed)
Summary: ed med  Phone Note Call from Patient Call back at [facility] Phone [phone]   Caller: Patient Reason for Call: Refill Medication Summary of Call: pt left VM  that he would like a rx  for the cheapest ED med (cialis, viagra). pls advise...............Marland KitchenFelecia Deloach CMA  May 29, 2010 12:16 PM   Follow-up for Phone Call        viagra sent to pharmacy--he can break them in half to make them last longer Follow-up by: Loreen Freud DO,  May 29, 2010 1:01 PM  Additional Follow-up for Phone Call Additional follow up Details #1::        Patient notified. Additional Follow-up by: Lucious Groves CMA,  May 29, 2010 3:20 PM    New/Updated Medications: VIAGRA 100 MG TABS (SILDENAFIL CITRATE) 1/2 -1 as directed Prescriptions: VIAGRA 100 MG TABS (SILDENAFIL CITRATE) 1/2 -1 as directed  #12 x 0   Entered and Authorized by:   Loreen Freud DO   Signed by:   Loreen Freud DO on 05/29/2010   Method used:   Electronically to        [facility] [phone]* (retail)       [address]       [place], [place]  [address]       Ph: [phone]       Fax: [phone]   RxID:   [id number]

## 2010-11-12 NOTE — Progress Notes (Signed)
Summary: CPAP Information  Phone Note Call from Patient Call back at Home Phone [phone] Call back at Work Phone [phone]   Caller: Patient Summary of Call: Patient called and left message on the triage line stating that he would like to see if he is eligible for cpap using for a sleep disorder. He is unsure of how to proceed with this and would like some guidence. Please advise. Initial call taken by: Harold Barban,  April 10, 2010 10:11 AM  Follow-up for Phone Call        He needs referal to pulm for sleep eval but we should see him first since we have never discussed this before. Follow-up by: Loreen Freud DO,  April 13, 2010 8:46 AM  Additional Follow-up for Phone Call Additional follow up Details #1::        Will call patient and make him aware and will sch OV. Additional Follow-up by: Harold Barban,  April 14, 2010 11:05 AM

## 2010-11-12 NOTE — Letter (Signed)
Summary: Primary Care Consult Scheduled Letter  [facility] [place]  [address] [place], [place] [address]   Phone: [phone]  Fax: [phone]      12/01/2009 MRN: [id number]  [facility] [address] [place], [place]  [address]    Dear Mr. NOBLET,    We have scheduled an appointment for you.  At the recommendation of Dr. Loreen Freud, we have scheduled you a consult with Dr. Gerlene Burdock Puschinsky with [facility] on 12-15-2009 at 2:30pm.  Their address is [address], [place] [address]. The office phone number is [phone].  If this appointment day and time is not convenient for you, please feel free to call the office of the doctor you are being referred to at the number listed above and reschedule the appointment.    It is important for you to keep your scheduled appointments. We are here to make sure you are given good patient care.   Thank you,    Renee, Patient Care Coordinator [place] [facility]

## 2010-11-12 NOTE — Letter (Signed)
Summary: Unable To Reach-Consult Scheduled  [facility] [place]  [address] [place], [place] [address]   Phone: [phone]  Fax: [phone]    04/03/2010 MRN: [id number]    Dear Mr. HANAWALT,   We have been unable to reach you by phone.  Please contact our office with an updated phone number.     Thank you,  Army Fossa CMA  April 03, 2010 2:27 PM

## 2010-11-12 NOTE — Letter (Signed)
Summary: CMN for CPAP Supplies/[facility]  CMN for CPAP Supplies/[facility]   Imported By: Sherian Rein 08/26/2010 10:35:17  _____________________________________________________________________  External Attachment:    Type:   Image     Comment:   External Document

## 2010-11-12 NOTE — Progress Notes (Signed)
Summary: Triam refill  Phone Note Refill Request Message from:  Fax from Pharmacy on May 18, 2010 3:12 PM  Refills Requested: Medication #1:  TRIAMTERENE-HCTZ 37.5-25 MG  CAPS Take one tablet daily [facility] - fax [phone]  Initial call taken by: Okey Regal Spring,  May 18, 2010 3:12 PM    Prescriptions: TRIAMTERENE-HCTZ 37.5-25 MG  CAPS (TRIAMTERENE-HCTZ) Take one tablet daily  #90 x 0   Entered by:   Lucious Groves CMA   Authorized by:   Loreen Freud DO   Signed by:   Lucious Groves CMA on 05/18/2010   Method used:   Faxed to ...       [facility] (mail-order)             , [place]         Ph: [phone]       Fax: [phone]   RxID:   [id number]

## 2010-11-12 NOTE — Letter (Signed)
Summary: [facility]   Imported By: Lanelle Bal 10/22/2009 13:18:28  _____________________________________________________________________  External Attachment:    Type:   Image     Comment:   External Document

## 2010-11-12 NOTE — Progress Notes (Signed)
Summary: Refill Request  Phone Note Refill Request Call back at [phone] Message from:  Pharmacy on March 30, 2010 9:05 AM  Refills Requested: Medication #1:  GLIPIZIDE 5 MG XR24H-TAB 1 by mouth once daily.   Dosage confirmed as above?Dosage Confirmed   Supply Requested: 3 months [facility]  Next Appointment Scheduled: none Initial call taken by: Lavell Islam,  March 30, 2010 9:09 AM    Prescriptions: GLIPIZIDE 5 MG XR24H-TAB (GLIPIZIDE) 1 by mouth once daily.  #90 x 0   Entered by:   Army Fossa CMA   Authorized by:   Loreen Freud DO   Signed by:   Army Fossa CMA on 03/30/2010   Method used:   Faxed to ...       [facility] (mail-order)             , [place]         Ph: [phone]       Fax: [phone]   RxID:   [id number]

## 2010-11-12 NOTE — Progress Notes (Signed)
Summary: refill  Phone Note Refill Request Message from:  Fax from Pharmacy on August 21, 2010 11:01 AM  Refills Requested: Medication #1:  TRIAMTERENE-HCTZ 37.5-25 MG  CAPS Take one tablet daily  Medication #2:  GLIPIZIDE 5 MG XR24H-TAB 1 by mouth once daily. [facility] - fax [phone]  Initial call taken by: Okey Regal Spring,  August 21, 2010 11:03 AM  Follow-up for Phone Call        [facility] did not receive- refaxed.    Prescriptions: GLIPIZIDE 5 MG XR24H-TAB (GLIPIZIDE) 1 by mouth once daily.  #90 x 1   Entered by:   Army Fossa CMA   Authorized by:   Loreen Freud DO   Signed by:   Army Fossa CMA on 08/21/2010   Method used:   Re-Faxed to ...       [facility])             , [place]         Ph:        Fax: [phone]   RxID:   [id number] TRIAMTERENE-HCTZ 37.5-25 MG  CAPS (TRIAMTERENE-HCTZ) Take one tablet daily  #90 x 1   Entered by:   Army Fossa CMA   Authorized by:   Loreen Freud DO   Signed by:   Army Fossa CMA on 08/21/2010   Method used:   Re-Faxed to ...       [facility])             , [place]         Ph:        Fax: [phone]   RxID:   [id number]

## 2010-11-12 NOTE — Progress Notes (Signed)
Summary: REFILL  Phone Note Refill Request Message from:  Fax from Pharmacy on January 05, 2010 12:09 PM  BD PEN NEEDLE SHORT 31GX5//16/ [facility] Valinda Hoar [phone]   Method Requested: Fax to Local Pharmacy Next Appointment Scheduled: NO APPT Initial call taken by: Barb Merino,  January 05, 2010 12:13 PM    New/Updated Medications: BD INSULIN SYR ULTRAFINE II 31G X 5/16" 1 ML MISC (INSULIN SYRINGE-NEEDLE U-100) as directed. Prescriptions: BD INSULIN SYR ULTRAFINE II 31G X 5/16" 1 ML MISC (INSULIN SYRINGE-NEEDLE U-100) as directed.  #100 x 5   Entered by:   Army Fossa CMA   Authorized by:   Loreen Freud DO   Signed by:   Army Fossa CMA on 01/05/2010   Method used:   Electronically to        [facility] [phone]* (retail)       [address]       [place], [place]  [address]       Ph: [phone]       Fax: [phone]   RxID:   [id number]

## 2010-11-12 NOTE — Progress Notes (Signed)
Summary: triamterene -hctz , glipizide refill  Phone Note Refill Request Message from:  Fax from Pharmacy on August 17, 2010 11:56 AM  Refills Requested: Medication #1:  TRIAMTERENE-HCTZ 37.5-25 MG  CAPS Take one tablet daily  Medication #2:  GLIPIZIDE 5 MG XR24H-TAB 1 by mouth once daily. [facility], fax = [phone]     qty = 90    Initial call taken by: Jerolyn Shin,  August 17, 2010 11:56 AM    Prescriptions: GLIPIZIDE 5 MG XR24H-TAB (GLIPIZIDE) 1 by mouth once daily.  #90 x 1   Entered by:   Almeta Monas CMA (AAMA)   Authorized by:   Loreen Freud DO   Signed by:   Almeta Monas CMA (AAMA) on 08/17/2010   Method used:   Faxed to ...       [facility] (mail-order)             , [place]         Ph: [phone]       Fax: [phone]   RxID:   [id number] TRIAMTERENE-HCTZ 37.5-25 MG  CAPS (TRIAMTERENE-HCTZ) Take one tablet daily  #90 x 1   Entered by:   Almeta Monas CMA (AAMA)   Authorized by:   Loreen Freud DO   Signed by:   Almeta Monas CMA (AAMA) on 08/17/2010   Method used:   Faxed to ...       [facility] (mail-order)             , [place]         Ph: [phone]       Fax: [phone]   RxID:   [id number]

## 2010-11-12 NOTE — Progress Notes (Signed)
Summary: refill   Phone Note Refill Request Message from:  Fax from Pharmacy on Mar 02, 2010 9:40 AM  Refills Requested: Medication #1:  SIMVASTATIN 40 MG  TABS Take one tablet every other day pt due for labs in [place] fax from [facility] - fax  [phone]  - phone [phone]   Method Requested: Fax to Mail Away Pharmacy Next Appointment Scheduled: none Initial call taken by: Okey Regal Spring,  Mar 02, 2010 9:42 AM    Prescriptions: SIMVASTATIN 40 MG  TABS (SIMVASTATIN) Take one tablet every other day pt due for labs in [place]  #90 x 0   Entered by:   Army Fossa CMA   Authorized by:   Loreen Freud DO   Signed by:   Army Fossa CMA on 03/02/2010   Method used:   Faxed to ...       [facility] (mail-order)       [address]       [place], [place]  [id number]       Ph: [phone]       Fax: [phone]   RxID:   [id number]

## 2010-11-12 NOTE — Progress Notes (Signed)
Summary: QUESTIONS ABOUT UROLOGY REFERRAL  Phone Note Outgoing Call   Initial call taken by: Magdalen Spatz [facility],  December 01, 2009 1:40 PM Call placed by: Magdalen Spatz [facility],  December 01, 2009 1:40 PM Call placed to: Patient Summary of Call: IN REF TO UROLOGY REFERRAL, I CALLED PT TO INFORM HIM OF HIS APPT W/THE UROLOGIST, AND HE IS QUESTIONING WHY DR. Laury Axon IS REFERRING HIM TO ONE.  PATIENT REQUESTS A NURSE CALL HIM ABOUT THIS. Initial call taken by: Magdalen Spatz [facility],  December 01, 2009 1:40 PM  Follow-up for Phone Call        left message to call office....................Marland KitchenFelecia Deloach CMA  December 01, 2009 2:30 PM   TG elevated-----   add tricor 145 #30 1 by mouth once daily ---con't niaspan and statin recheck 3 months------  NMR , hep  272.4  PSA elevated ---refer to urology  Additional Follow-up for Phone Call Additional follow up Details #1::        pt aware, rx sent to pharmacy, letter mailed..........Marland KitchenFelecia Deloach CMA  December 01, 2009 4:10 PM     New/Updated Medications: TRICOR 145 MG TABS (FENOFIBRATE) take 1 by mouth once daily Prescriptions: TRICOR 145 MG TABS (FENOFIBRATE) take 1 by mouth once daily  #30 x 0   Entered by:   Jeremy Johann CMA   Authorized by:   Loreen Freud DO   Signed by:   Jeremy Johann CMA on 12/01/2009   Method used:   Faxed to ...       [facility]  [address] [phone]* (retail)       [address]       [place], [place]  [address]       Ph: [phone]       Fax: [phone]   RxID:   [id number]

## 2010-11-12 NOTE — Progress Notes (Signed)
Summary: [facility]  Phone Note Refill Request Message from:  Fax from Pharmacy on November 05, 2009 9:52 AM  Refills Requested: Medication #1:  SIMVASTATIN 40 MG  TABS Take one tablet every other day pt due for labs in [place] Refill request from [facility]  Next Appointment Scheduled: 11/27/09 Dr.Lowne Initial call taken by: Michaelle Copas,  November 05, 2009 9:53 AM  Follow-up for Phone Call        left message for pt to call back. needs labs. Army Fossa CMA  November 05, 2009 10:29 AM

## 2010-11-12 NOTE — Progress Notes (Signed)
Summary: lab results (lmom 6/20,6/22,6/23)  Phone Note Outgoing Call   Call placed by: Army Fossa CMA,  March 30, 2010 10:23 AM Reason for Call: Discuss lab or test results Summary of Call: Regarding lab results, LMTCB:  LDL particles are high and particle size is small---high risk of heart attack and strok--- increase niaspan to 1000 mg at bedtime  #30  2 refills---recheck 3 months----lipid, hep 272.4   Signed by Loreen Freud DO on 03/27/2010 at 10:07 PM  ________________________________________________________________________ also add hgba1c, bmp in 3 months 250.00   Follow-up for Phone Call        left message to call back. Army Fossa CMA  April 01, 2010 10:14 AM   Additional Follow-up for Phone Call Additional follow up Details #1::        left message on pts cell phone number. Army Fossa CMA  April 02, 2010 1:24 PM     Additional Follow-up for Phone Call Additional follow up Details #2::    mailed pt a copy of labs and a letter to contact the office. Army Fossa CMA  April 03, 2010 2:27 PM

## 2010-11-12 NOTE — Assessment & Plan Note (Signed)
Summary: rov for osa   Copy to:  Loreen Freud Primary Provider/Referring Provider:  Loreen Freud DO  CC:  5 week follow. pt states he uses cpap everynight x 8-9 hrs a night. Pt c/o dry mouth when using cpap machine. pt states everything is going well. Marland Kitchen  History of Present Illness: the pt comes in today for f/u of his osa.  He was started on cpap the last visit at a moderate pressure, and feels he has done well with the device.  He has no issues with mask fit, and the pressure is not overbearing.  He does c/o signficant dryness, but was unaware he could adjust the heater on his humidifier to increase moisture delivery.  He feels he is sleeping much better, and his bedpartner has not heard breakthru snoring.  He feels much more rested in the am's, and is not having to take afternoon naps.  He does doze with tv at times, but I have reminded him we have yet to optimize his pressure.  Current Medications (verified): 1)  Niaspan 1000 Mg Cr-Tabs (Niacin (Antihyperlipidemic)) .Marland Kitchen.. 1 By Mouth Daily. 2)  Glucophage Xr 500 Mg Xr24h-Tab (Metformin Hcl) .... 2 By Mouth Two Times A Day 3)  Glipizide 5 Mg Xr24h-Tab (Glipizide) .Marland Kitchen.. 1 By Mouth Once Daily. 4)  Triamterene-Hctz 37.5-25 Mg  Caps (Triamterene-Hctz) .... Take One Tablet Daily 5)  Baby Aspirin 81 Mg  Chew (Aspirin) .... Take One Tablet Every Other Day 6)  Simvastatin 40 Mg  Tabs (Simvastatin) .... Take One Tablet Every Other Day Pt Due For Labs in June 7)  Lantus Solostar 100 Unit/ml  Soln (Insulin Glargine) .... Use 58  Units Daily 8)  Bd U/f Short Pen Needle 31g X 8 Mm  Misc (Insulin Pen Needle) .... Use As Directed Once Daily 9)  Cvs Ibuprofen Ib 200 Mg Tabs (Ibuprofen) .... Take 1 Tab Once Daily 10)  One Touch Ultra Test Strips .... Checks Bs Two Times A Day Dx 250.00 11)  Vitamin C 500 Mg Tabs (Ascorbic Acid) .Marland Kitchen.. 1 Every Other Day 12)  Cozaar 50 Mg Tabs (Losartan Potassium) .Marland Kitchen.. 1 By Mouth Once Daily 13)  Viagra 100 Mg Tabs (Sildenafil  Citrate) .... 1/2 -1 As Directed 14)  Bd Insulin Syr Ultrafine Ii 31g X 5/16" 1 Ml Misc (Insulin Syringe-Needle U-100) .... As Directed. 15)  Fish Oil 1200 Mg Caps (Omega-3 Fatty Acids) .... 2 Tablet Two Times A Day 16)  Olive Oil  Oil (Olive Oil)  Allergies (verified): No Known Drug Allergies  Past History:  Past medical, surgical, family and social histories (including risk factors) reviewed, and no changes noted (except as noted below).  Past Medical History: Reviewed history from 01/18/2007 and no changes required. Diabetes mellitus, type II Hyperlipidemia Hypertension Diverticulosis, colon  Past Surgical History: Reviewed history from 05/28/2010 and no changes required. left foot surgery--achilles tendon tonsillectomy  Family History: Reviewed history from 05/28/2010 and no changes required. F--cancer? 69yo M--heart diease  Family History of Cervical cancer  Social History: Reviewed history from 05/28/2010 and no changes required. Retired--Ins adjuster Married former smoker.  started in "late teens"  1 pack per 3 days.  quit 1986. Alcohol use-yes Drug use-no Regular exercise-yes  Review of Systems  The patient denies shortness of breath with activity, shortness of breath at rest, productive cough, non-productive cough, coughing up blood, chest pain, irregular heartbeats, acid heartburn, indigestion, loss of appetite, weight change, abdominal pain, difficulty swallowing, sore throat, tooth/dental problems, headaches, nasal congestion/difficulty breathing  through nose, sneezing, itching, ear ache, anxiety, depression, hand/feet swelling, joint stiffness or pain, rash, change in color of mucus, and fever.    Vital Signs:  Patient profile:   70 year old male Height:      71.5 inches Weight:      263 pounds BMI:     36.30 O2 Sat:      96 % on Room air Temp:     98.7 degrees F oral Pulse rate:   72 / minute BP sitting:   120 / 80  (left arm) Cuff size:    large  Vitals Entered By: Carver Fila (August 20, 2010 11:47 AM)  O2 Flow:  Room air CC: 5 week follow. pt states he uses cpap everynight x 8-9 hrs a night. Pt c/o dry mouth when using cpap machine. pt states everything is going well.  Comments meds and allergies updated Phone number updated Carver Fila  August 20, 2010 11:48 AM    Physical Exam  General:  ow male in nad Nose:  no skin breakdown or pressure necrosis from cpap mask Lungs:  clear Extremities:  no edema or cyanosis  Neurologic:  alert and oriented, moves all 4.   Impression & Recommendations:  Problem # 1:  OBSTRUCTIVE SLEEP APNEA (ICD-327.23) the pt is doing fairly well with cpap.  He has no issues with the mask fit or pressure tolerance.  He is having some dryness issues, and I have discussed with him the adjustments to the heater that will allow him to get more moisture.  I have also asked him to work aggressively on weight loss.  We need to get his pressure optimized for him, and will do this on auto mode.   Care Plan:  At this point, will arrange for the patient's machine to be changed over to auto mode for 2 weeks to optimize their pressure.  I will review the downloaded data once sent by dme, and also evaluate for compliance, leaks, and residual osa.  I will call the patient and dme to discuss the results, and have the patient's machine set appropriately.  This will serve as the pt's cpap pressure titration.  Medications Added to Medication List This Visit: 1)  Lantus Solostar 100 Unit/ml Soln (Insulin glargine) .... Use 58  units daily 2)  Vitamin C 500 Mg Tabs (Ascorbic acid) .Marland Kitchen.. 1 every other day 3)  Fish Oil 1200 Mg Caps (Omega-3 fatty acids) .... 2 tablet two times a day  Other Orders: Est. Patient Level IV (81191) DME Referral (DME)  Patient Instructions: 1)  will optimize pressure on auto mode for the next 2 weeks.  I will call you with the results. 2)  work on weight loss 3)  followup with me in  6mos.

## 2010-11-12 NOTE — Progress Notes (Signed)
Summary: REFILL  Phone Note Refill Request Call back at [phone] Message from:  Pharmacy on October 02, 2010 4:06 PM  Refills Requested: Medication #1:  NIASPAN 1000 MG CR-TABS 1 by mouth daily.   Dosage confirmed as above?Dosage Confirmed   Supply Requested: 3 months [facility]  Initial call taken by: Lavell Islam,  October 02, 2010 4:06 PM    Prescriptions: NIASPAN 1000 MG CR-TABS (NIACIN (ANTIHYPERLIPIDEMIC)) 1 by mouth daily.  #90 x 1   Entered by:   Almeta Monas CMA (AAMA)   Authorized by:   Loreen Freud DO   Signed by:   Almeta Monas CMA (AAMA) on 10/02/2010   Method used:   Faxed to ...       [facility] (mail-order)             , [place]         Ph: [phone]       Fax: [phone]   RxID:   [id number]

## 2010-11-12 NOTE — Progress Notes (Signed)
Summary: Refill Request  Phone Note Refill Request Call back at [phone] Message from:  Pharmacy on March 30, 2010 10:07 AM  Refills Requested: Medication #1:  NIASPAN 500 MG  TBCR Take one tablet daily**LABS DUE NOW**   Dosage confirmed as above?Dosage Confirmed   Supply Requested: 3 months [facility]  Next Appointment Scheduled: none Initial call taken by: Lavell Islam,  March 30, 2010 10:08 AM    Prescriptions: NIASPAN 500 MG  TBCR (NIACIN (ANTIHYPERLIPIDEMIC)) Take one tablet daily**LABS DUE NOW**  #90 x 0   Entered by:   Army Fossa CMA   Authorized by:   Loreen Freud DO   Signed by:   Army Fossa CMA on 03/30/2010   Method used:   Faxed to ...       [facility] (mail-order)             , [place]         Ph: [phone]       Fax: [phone]   RxID:   [id number]   Appended Document: Refill Request    Clinical Lists Changes  Medications: Changed medication from NIASPAN 500 MG  TBCR (NIACIN (ANTIHYPERLIPIDEMIC)) Take one tablet daily**LABS DUE NOW** to NIASPAN 1000 MG CR-TABS (NIACIN (ANTIHYPERLIPIDEMIC)) 1 by mouth daily. - Signed Rx of NIASPAN 1000 MG CR-TABS (NIACIN (ANTIHYPERLIPIDEMIC)) 1 by mouth daily.;  #90 x 1;  Signed;  Entered by: Army Fossa CMA;  Authorized by: Loreen Freud DO;  Method used: Faxed to [facility], , , [place]  , Ph: [phone], Fax: [phone]    Prescriptions: NIASPAN 1000 MG CR-TABS (NIACIN (ANTIHYPERLIPIDEMIC)) 1 by mouth daily.  #90 x 1   Entered by:   Army Fossa CMA   Authorized by:   Loreen Freud DO   Signed by:   Army Fossa CMA on 03/30/2010   Method used:   Faxed to ...       [facility] (mail-order)             , [place]         Ph: [phone]       Fax: [phone]   RxID:   [id number]

## 2010-11-12 NOTE — Miscellaneous (Signed)
Summary: [facility]  [facility]   Imported By: Lanelle Bal 06/29/2010 10:37:10  _____________________________________________________________________  External Attachment:    Type:   Image     Comment:   External Document  Appended Document: [facility]    Clinical Lists Changes  Observations: Added new observation of FLU VAX: Fluvax MCR (06/18/2010 12:34)       Immunization History:  Influenza Immunization History:    Influenza:  Fluvax MCR (06/18/2010)

## 2010-11-12 NOTE — Letter (Signed)
[facility]   Imported By: Lanelle Bal 01/20/2010 11:19:04  _____________________________________________________________________  External Attachment:    Type:   Image     Comment:   External Document

## 2010-11-12 NOTE — Progress Notes (Signed)
Summary: Refill Request  Phone Note Refill Request Call back at [phone] Message from:  Pharmacy on Feb 24, 2010 9:50 AM  Refills Requested: Medication #1:  BABY ASPIRIN 81 MG  CHEW Take one tablet every other day   Dosage confirmed as above?Dosage Confirmed   Supply Requested: 3 months [facility]  Next Appointment Scheduled: none Initial call taken by: Harold Barban,  Feb 24, 2010 9:50 AM    Prescriptions: BABY ASPIRIN 81 MG  CHEW (ASPIRIN) Take one tablet every other day  #90 x 3   Entered by:   Army Fossa CMA   Authorized by:   Loreen Freud DO   Signed by:   Army Fossa CMA on 02/24/2010   Method used:   Faxed to ...       [facility] (mail-order)       [address]       [place], [place]  [id number]       Ph: [phone]       Fax: [phone]   RxID:   [id number]

## 2010-11-12 NOTE — Letter (Signed)
[facility]   Imported By: Lanelle Bal 01/07/2010 13:13:18  _____________________________________________________________________  External Attachment:    Type:   Image     Comment:   External Document

## 2010-11-12 NOTE — Progress Notes (Signed)
Summary: lm am pt needs lab appt  Phone Note Outgoing Call   Call placed by: Army Fossa CMA,  Mar 02, 2010 9:52 AM Reason for Call: Confirm/change Appt Summary of Call: Pt needs labwork:  NMR , hep  272.4  Follow-up for Phone Call        lma am to call & schedule lab appt Follow-up by: Okey Regal Spring,  Mar 02, 2010 1:46 PM  Additional Follow-up for Phone Call Additional follow up Details #1::        Patient is coming in on 6.2.11. Additional Follow-up by: Harold Barban,  Mar 03, 2010 1:36 PM

## 2010-11-12 NOTE — Assessment & Plan Note (Signed)
Summary: CPX AND FASTING LABS///SPH   Vital Signs:  Patient profile:   70 year old male Weight:      248 pounds Temp:     97.5 degrees F oral Pulse rate:   76 / minute Pulse rhythm:   regular BP sitting:   136 / 86  (left arm) Cuff size:   large  Vitals Entered By: Army Fossa CMA (November 27, 2009 9:05 AM) CC: CPX, fasting.    History of Present Illness: Pt here for cpe and labs.     Type 1 diabetes mellitus follow-up      This is a 70 year old man who presents with Type 2 diabetes mellitus follow-up.  The patient denies polyuria, polydipsia, blurred vision, self managed hypoglycemia, hypoglycemia requiring help, weight loss, weight gain, and numbness of extremities.  The patient denies the following symptoms: neuropathic pain, chest pain, vomiting, orthostatic symptoms, poor wound healing, intermittent claudication, vision loss, and foot ulcer.  Since the last visit the patient reports good dietary compliance, compliance with medications, exercising regularly, and monitoring blood glucose.  The patient has been measuring capillary blood glucose before breakfast and at bedtime.  Since the last visit, the patient reports having had eye care by an ophthalmologist and no foot care.    Hyperlipidemia follow-up      The patient also presents for Hyperlipidemia follow-up.  The patient denies muscle aches, GI upset, abdominal pain, flushing, itching, constipation, diarrhea, and fatigue.  The patient denies the following symptoms: chest pain/pressure, exercise intolerance, dypsnea, palpitations, syncope, and pedal edema.  Compliance with medications (by patient report) has been near 100%.  Dietary compliance has been fair.  The patient reports exercising daily.  Adjunctive measures currently used by the patient include ASA and fish oil supplements.    Hypertension follow-up      The patient also presents for Hypertension follow-up.  The patient denies lightheadedness, urinary frequency,  headaches, edema, impotence, rash, and fatigue.  The patient denies the following associated symptoms: chest pain, chest pressure, exercise intolerance, dyspnea, palpitations, syncope, leg edema, and pedal edema.  Compliance with medications (by patient report) has been near 100%.  The patient reports that dietary compliance has been fair.  The patient reports exercising daily.  Adjunctive measures currently used by the patient include salt restriction.    Preventive Screening-Counseling & Management  Alcohol-Tobacco     Alcohol drinks/day: 2     Alcohol type: liquour, wine     Smoking Status: never     Passive Smoke Exposure: no  Caffeine-Diet-Exercise     Caffeine use/day: 0     Does Patient Exercise: yes     Type of exercise: walking     Times/week: 5  Hep-HIV-STD-Contraception     HIV Risk: no     Dental Visit-last 6 months yes     Dental Care Counseling: not indicated; dental care within six months  Safety-Violence-Falls     Seat Belt Use: 100      Sexual History:  currently monogamous and married.        Drug Use:  never.    Current Medications (verified): 1)  Niaspan 500 Mg  Tbcr (Niacin (Antihyperlipidemic)) .... Take One Tablet Daily**labs Due Now** 2)  Glucophage Xr 500 Mg Xr24h-Tab (Metformin Hcl) .Marland Kitchen.. 1 By Mouth Two Times A Day 3)  Glipizide 5 Mg Xr24h-Tab (Glipizide) .Marland Kitchen.. 1 By Mouth Once Daily. 4)  Triamterene-Hctz 37.5-25 Mg  Caps (Triamterene-Hctz) .... Take One Tablet Daily 5)  Fish Oil  Concentrate 120-180 Mg  Caps (Omega-3 Fatty Acids) 6)  Baby Aspirin 81 Mg  Chew (Aspirin) .... Take One Tablet Every Other Day 7)  Simvastatin 40 Mg  Tabs (Simvastatin) .... Take One Tablet Every Other Day Pt Due For Labs in June 8)  Lantus Solostar 100 Unit/ml  Soln (Insulin Glargine) .... Use 40  Units Daily 9)  Bd U/f Short Pen Needle 31g X 8 Mm  Misc (Insulin Pen Needle) .... Use As Directed Once Daily 10)  Cvs Ibuprofen Ib 200 Mg Tabs (Ibuprofen) .... Take 1 Tab Once  Daily 11)  One Touch Ultra Test Strips .... Checks Bs Two Times A Day Dx 250.00 12)  Vitamin C 500 Mg Tabs (Ascorbic Acid) .... Two Times A Day 13)  Cozaar 50 Mg Tabs (Losartan Potassium) .Marland Kitchen.. 1 By Mouth Once Daily 14)  Levitra 10 Mg Tabs (Vardenafil Hcl) .... Take As Directed  Allergies (verified): No Known Drug Allergies  Past History:  Past Medical History: Last updated: 01/18/2007 Diabetes mellitus, type II Hyperlipidemia Hypertension Diverticulosis, colon  Past Surgical History: Last updated: 09/10/2008 left foot surgery--achilles tendon  Family History: Last updated: 09/10/2008 F--cancer? 70yo M-- Family History of Cervical cancer  Social History: Last updated: 09/10/2008 Retired--Ins adjuster Married Never Smoked Alcohol use-yes Drug use-no Regular exercise-yes  Risk Factors: Alcohol Use: 2 (11/27/2009) Caffeine Use: 0 (11/27/2009) Exercise: yes (11/27/2009)  Risk Factors: Smoking Status: never (11/27/2009) Passive Smoke Exposure: no (11/27/2009)  Family History: Reviewed history from 09/10/2008 and no changes required. F--cancer? 70yo M-- Family History of Cervical cancer  Social History: Reviewed history from 09/10/2008 and no changes required. Retired--Ins adjuster Married Never Smoked Alcohol use-yes Drug use-no Regular exercise-yes Dental Care w/in 6 mos.:  yes Sexual History:  currently monogamous, married Drug Use:  never  Review of Systems      See HPI General:  Denies chills, fatigue, fever, loss of appetite, malaise, sleep disorder, sweats, weakness, and weight loss. Eyes:  Denies blurring, discharge, double vision, eye irritation, eye pain, halos, itching, light sensitivity, red eye, vision loss-1 eye, and vision loss-both eyes. ENT:  Denies decreased hearing, difficulty swallowing, ear discharge, earache, hoarseness, nasal congestion, nosebleeds, postnasal drainage, ringing in ears, sinus pressure, and sore throat. CV:  Denies  bluish discoloration of lips or nails, chest pain or discomfort, difficulty breathing at night, difficulty breathing while lying down, fainting, fatigue, leg cramps with exertion, lightheadness, near fainting, palpitations, shortness of breath with exertion, swelling of feet, swelling of hands, and weight gain. Resp:  Denies chest discomfort, chest pain with inspiration, cough, coughing up blood, excessive snoring, hypersomnolence, morning headaches, pleuritic, shortness of breath, sputum productive, and wheezing. GI:  Denies abdominal pain, bloody stools, change in bowel habits, constipation, dark tarry stools, diarrhea, excessive appetite, gas, hemorrhoids, indigestion, loss of appetite, nausea, vomiting, vomiting blood, and yellowish skin color. GU:  Denies decreased libido, discharge, dysuria, erectile dysfunction, hematuria, incontinence, nocturia, urinary frequency, and urinary hesitancy. MS:  Denies joint pain, joint redness, joint swelling, loss of strength, low back pain, mid back pain, muscle aches, muscle , cramps, muscle weakness, stiffness, and thoracic pain. Derm:  Denies changes in color of skin, changes in nail beds, dryness, excessive perspiration, flushing, hair loss, insect bite(s), itching, lesion(s), poor wound healing, and rash; derm q1y. Neuro:  Denies brief paralysis, difficulty with concentration, disturbances in coordination, falling down, headaches, inability to speak, memory loss, numbness, poor balance, seizures, sensation of room spinning, tingling, tremors, visual disturbances, and weakness. Psych:  Denies alternate hallucination (  auditory/visual), anxiety, depression, easily angered, easily tearful, irritability, mental problems, panic attacks, sense of great danger, suicidal thoughts/plans, thoughts of violence, unusual visions or sounds, and thoughts /plans of harming others. Endo:  Denies cold intolerance, excessive hunger, excessive thirst, excessive urination, heat  intolerance, polyuria, and weight change. Heme:  Denies abnormal bruising, bleeding, enlarge lymph nodes, fevers, pallor, and skin discoloration. Allergy:  Denies hives or rash, itching eyes, persistent infections, seasonal allergies, and sneezing.  Physical Exam  General:  Well-developed,well-nourished,in no acute distress; alert,appropriate and cooperative throughout examination Head:  Normocephalic and atraumatic without obvious abnormalities. No apparent alopecia or balding. Eyes:  vision grossly intact, pupils equal, pupils round, pupils reactive to light, and no injection.   Ears:  External ear exam shows no significant lesions or deformities.  Otoscopic examination reveals clear canals, tympanic membranes are intact bilaterally without bulging, retraction, inflammation or discharge. Hearing is grossly normal bilaterally. Nose:  External nasal examination shows no deformity or inflammation. Nasal mucosa are pink and moist without lesions or exudates. Mouth:  Oral mucosa and oropharynx without lesions or exudates.  Teeth in good repair. Neck:  No deformities, masses, or tenderness noted. Chest Wall:  No deformities, masses, tenderness or gynecomastia noted. Lungs:  Normal respiratory effort, chest expands symmetrically. Lungs are clear to auscultation, no crackles or wheezes. Heart:  normal rate and no murmur.   Abdomen:  Bowel sounds positive,abdomen soft and non-tender without masses, organomegaly or hernias noted. Rectal:  No external abnormalities noted. Normal sphincter tone. No rectal masses or tenderness. Genitalia:  Testes bilaterally descended without nodularity, tenderness or masses. No scrotal masses or lesions. No penis lesions or urethral discharge. Prostate:  Prostate gland firm and smooth, no enlargement, nodularity, tenderness, mass, asymmetry or induration. Msk:  No deformity or scoliosis noted of thoracic or lumbar spine.    Diabetes Management Exam:    Foot Exam (with  socks and/or shoes not present):       Sensory-Pinprick/Light touch:          Left medial foot (L-4): normal          Left dorsal foot (L-5): normal          Left lateral foot (S-1): normal          Right medial foot (L-4): normal          Right dorsal foot (L-5): normal          Right lateral foot (S-1): normal       Sensory-Monofilament:          Left foot: normal          Right foot: normal       Inspection:          Left foot: normal          Right foot: normal       Nails:          Left foot: normal          Right foot: normal    Eye Exam:       Eye Exam done elsewhere   Impression & Recommendations:  Problem # 1:  PREVENTIVE HEALTH CARE (ICD-V70.0)  ghm utd  Reviewed preventive care protocols, scheduled due services, and updated immunizations.  Problem # 2:  HYPERTENSION (ICD-401.9)  His updated medication list for this problem includes:    Triamterene-hctz 37.5-25 Mg Caps (Triamterene-hctz) .Marland Kitchen... Take one tablet daily    Cozaar 50 Mg Tabs (Losartan potassium) .Marland Kitchen... 1 by mouth once  daily  Orders: Venipuncture (98119) TLB-Lipid Panel (80061-LIPID) TLB-BMP (Basic Metabolic Panel-BMET) (80048-METABOL) TLB-CBC Platelet - w/Differential (85025-CBCD) TLB-Hepatic/Liver Function Pnl (80076-HEPATIC) TLB-PSA (Prostate Specific Antigen) (84153-PSA) EKG w/ Interpretation (93000)  BP today: 136/86 Prior BP: 136/86 (07/18/2009)  Labs Reviewed: K+: 3.7 (09/10/2008) Creat: : 1.0 (09/10/2008)   Chol: 143 (09/10/2008)   HDL: 38.8 (09/10/2008)   LDL: 70 (09/10/2008)   TG: 169 (09/10/2008)  Problem # 3:  HYPERLIPIDEMIA (ICD-272.4)  His updated medication list for this problem includes:    Niaspan 500 Mg Tbcr (Niacin (antihyperlipidemic)) .Marland Kitchen... Take one tablet daily**labs due now**    Simvastatin 40 Mg Tabs (Simvastatin) .Marland Kitchen... Take one tablet every other day pt due for labs in june  Orders: Venipuncture (14782) TLB-Lipid Panel (80061-LIPID) TLB-BMP (Basic Metabolic  Panel-BMET) (80048-METABOL) TLB-CBC Platelet - w/Differential (85025-CBCD) TLB-Hepatic/Liver Function Pnl (80076-HEPATIC) TLB-PSA (Prostate Specific Antigen) (84153-PSA) EKG w/ Interpretation (93000)  Labs Reviewed: SGOT: 27 (09/10/2008)   SGPT: 34 (09/10/2008)   HDL:38.8 (09/10/2008), 30.5 (11/08/2007)  LDL:70 (09/10/2008), 50 (11/08/2007)  Chol:143 (09/10/2008), 110 (11/08/2007)  Trig:169 (09/10/2008), 149 (11/08/2007)  Problem # 4:  DIABETES MELLITUS, TYPE II (ICD-250.00)  His updated medication list for this problem includes:    Glucophage Xr 500 Mg Xr24h-tab (Metformin hcl) .Marland Kitchen... 1 by mouth two times a day    Glipizide 5 Mg Xr24h-tab (Glipizide) .Marland Kitchen... 1 by mouth once daily.    Baby Aspirin 81 Mg Chew (Aspirin) .Marland Kitchen... Take one tablet every other day    Lantus Solostar 100 Unit/ml Soln (Insulin glargine) ..... Use 40  units daily    Cozaar 50 Mg Tabs (Losartan potassium) .Marland Kitchen... 1 by mouth once daily  Orders: Venipuncture (95621) TLB-Lipid Panel (80061-LIPID) TLB-BMP (Basic Metabolic Panel-BMET) (80048-METABOL) TLB-CBC Platelet - w/Differential (85025-CBCD) TLB-Hepatic/Liver Function Pnl (80076-HEPATIC) TLB-PSA (Prostate Specific Antigen) (84153-PSA) EKG w/ Interpretation (93000)  Labs Reviewed: Creat: 1.0 (09/10/2008)    Reviewed HgBA1c results: 6.4 (11/13/2007)  5.7 (05/01/2007)  Complete Medication List: 1)  Niaspan 500 Mg Tbcr (Niacin (antihyperlipidemic)) .... Take one tablet daily**labs due now** 2)  Glucophage Xr 500 Mg Xr24h-tab (Metformin hcl) .Marland Kitchen.. 1 by mouth two times a day 3)  Glipizide 5 Mg Xr24h-tab (Glipizide) .Marland Kitchen.. 1 by mouth once daily. 4)  Triamterene-hctz 37.5-25 Mg Caps (Triamterene-hctz) .... Take one tablet daily 5)  Fish Oil Concentrate 120-180 Mg Caps (Omega-3 fatty acids) 6)  Baby Aspirin 81 Mg Chew (Aspirin) .... Take one tablet every other day 7)  Simvastatin 40 Mg Tabs (Simvastatin) .... Take one tablet every other day pt due for labs in june 8)   Lantus Solostar 100 Unit/ml Soln (Insulin glargine) .... Use 40  units daily 9)  Bd U/f Short Pen Needle 31g X 8 Mm Misc (Insulin pen needle) .... Use as directed once daily 10)  Cvs Ibuprofen Ib 200 Mg Tabs (Ibuprofen) .... Take 1 tab once daily 11)  One Touch Ultra Test Strips  .... Checks bs two times a day dx 250.00 12)  Vitamin C 500 Mg Tabs (Ascorbic acid) .... Two times a day 13)  Cozaar 50 Mg Tabs (Losartan potassium) .Marland Kitchen.. 1 by mouth once daily 14)  Levitra 10 Mg Tabs (Vardenafil hcl) .... Take as directed  Other Orders: Pneumococcal Vaccine (30865) Admin 1st Vaccine (78469)   EKG  Procedure date:  11/27/2009  Findings:      Normal sinus rhythm with rate of:  67 bpm     Immunizations Administered:  Pneumonia Vaccine:    Vaccine Type: Pneumovax    Site: left  deltoid    Mfr: Merck    Dose: 0.5 ml    Route: IM    Given by: Army Fossa CMA    Exp. Date: 01/29/2011    Lot #: [id number]    Last Colonoscopy:  polyps, diverticulosis  repeat 5 years (06/30/2006 1:30:11 PM) Colonoscopy Result Date:  06/30/2006 Colonoscopy Result:  abnormal Colonoscopy Next Due:  5 yr   Immunizations Administered:  Pneumonia Vaccine:    Vaccine Type: Pneumovax    Site: left deltoid    Mfr: Merck    Dose: 0.5 ml    Route: IM    Given by: Army Fossa CMA    Exp. Date: 01/29/2011    Lot #: [id number]   Laboratory Results   Urine Tests   Date/Time Reported: November 27, 2009 11:42 AM  Routine Urinalysis   Color: yellow Appearance: Clear Glucose: negative   (Normal Range: Negative) Bilirubin: negative   (Normal Range: Negative) Ketone: negative   (Normal Range: Negative) Spec. Gravity: 1.010   (Normal Range: 1.003-1.035) Blood: negative   (Normal Range: Negative) pH: 6.5   (Normal Range: 5.0-8.0) Protein: negative   (Normal Range: Negative) Urobilinogen: 0.2   (Normal Range: 0-1) Nitrite: negative   (Normal Range: Negative) Leukocyte Esterace: negative   (Normal Range:  Negative)

## 2010-11-12 NOTE — Progress Notes (Signed)
Summary: refil  Phone Note Refill Request Message from:  Fax from Pharmacy on November 05, 2010 10:20 AM  Refills Requested: Medication #1:  NIASPAN 1000 MG CR-TABS 1 by mouth daily. [facility] - fax [phone]  Initial call taken by: Okey Regal [place],  November 05, 2010 10:20 AM    Prescriptions: NIASPAN 1000 MG CR-TABS (NIACIN (ANTIHYPERLIPIDEMIC)) 1 by mouth daily.  #90 x 0   Entered by:   Almeta Monas CMA (AAMA)   Authorized by:   Loreen Freud DO   Signed by:   Almeta Monas CMA (AAMA) on 11/05/2010   Method used:   Faxed to ...       [facility] (mail-order)             , [place]         Ph: [phone]       Fax: [phone]   RxID:   [id number]

## 2010-11-18 NOTE — Progress Notes (Signed)
Summary: Refill Request  Phone Note Refill Request Call back at [phone] Message from:  Pharmacy on November 09, 2010 9:08 AM  Refills Requested: Medication #1:  SIMVASTATIN 40 MG  TABS Take one tablet every other day pt due for labs in [place]   Dosage confirmed as above?Dosage Confirmed   Supply Requested: 3 months   Last Refilled: 03/02/2010 [facility]  Next Appointment Scheduled: none Initial call taken by: Harold Barban,  November 09, 2010 9:08 AM    New/Updated Medications: SIMVASTATIN 40 MG  TABS (SIMVASTATIN) Take one tablet every other ** LABS DUE NOW** Prescriptions: SIMVASTATIN 40 MG  TABS (SIMVASTATIN) Take one tablet every other ** LABS DUE NOW**  #90 x 0   Entered by:   Almeta Monas CMA (AAMA)   Authorized by:   Loreen Freud DO   Signed by:   Almeta Monas CMA (AAMA) on 11/09/2010   Method used:   Faxed to ...       [facility] (retail)       [address] [place], [place]  [address]       Ph: [phone]       Fax: [phone]   RxID:   [id number]

## 2010-11-29 ENCOUNTER — Encounter: Payer: Self-pay | Admitting: Pulmonary Disease

## 2010-12-08 NOTE — Miscellaneous (Signed)
Summary: optimal cpap 13cm  Clinical Lists Changes  Orders: Added new Referral order of DME Referral (DME) - Signed auto shows good compliance, no significant air leaks, and optimal pressure of 13cm

## 2011-01-10 ENCOUNTER — Other Ambulatory Visit: Payer: Self-pay | Admitting: Family Medicine

## 2011-02-08 ENCOUNTER — Other Ambulatory Visit: Payer: Self-pay

## 2011-02-10 ENCOUNTER — Other Ambulatory Visit: Payer: Self-pay

## 2011-02-10 MED ORDER — GLIPIZIDE 5 MG PO TABS: 5.0000 mg | ORAL_TABLET | Freq: Every day | ORAL | Status: DC

## 2011-02-10 NOTE — Telephone Encounter (Signed)
Rx faxed to [facility]      KP

## 2011-02-12 ENCOUNTER — Encounter: Payer: Self-pay | Admitting: Pulmonary Disease

## 2011-02-15 ENCOUNTER — Telehealth: Payer: Self-pay | Admitting: Family Medicine

## 2011-02-15 ENCOUNTER — Telehealth: Payer: Self-pay | Admitting: Internal Medicine

## 2011-02-15 ENCOUNTER — Telehealth: Payer: Self-pay

## 2011-02-15 NOTE — Telephone Encounter (Signed)
[name], [facility] 02/15/2011 1:14 PM Signed  Please advise if it is ok to do the PSA and DX code. Thank You KP Jerolyn Shin 02/15/2011 9:09 AM Signed  Patient scheduled fasting labs for Tuesday 5/8---asked if Dr Laury Axon could add PSA to his list of labs? If OK, will need diag code too. thanks

## 2011-02-15 NOTE — Telephone Encounter (Signed)
Error wrong patient

## 2011-02-15 NOTE — Telephone Encounter (Signed)
Patient scheduled fasting labs for Tuesday 5/8---asked if Dr Laury Axon could add PSA to his list of labs?  If OK, will need diag code too.   thanks

## 2011-02-15 NOTE — Telephone Encounter (Signed)
Please advise if it is ok to do the PSA and  DX code. Thank You     KP

## 2011-02-15 NOTE — Telephone Encounter (Signed)
Code sent to [facility]

## 2011-02-15 NOTE — Telephone Encounter (Signed)
i just sent code to [facility]

## 2011-02-15 NOTE — Telephone Encounter (Deleted)
Patient scheduled fasting labs for Tuesday 5/8---asked if Dr Lowne could add PSA to his list of labs?  If OK, will need diag code too.   thanks ° °

## 2011-02-16 ENCOUNTER — Other Ambulatory Visit (INDEPENDENT_AMBULATORY_CARE_PROVIDER_SITE_OTHER): Payer: Medicare PPO

## 2011-02-16 DIAGNOSIS — E119 Type 2 diabetes mellitus without complications: Secondary | ICD-10-CM

## 2011-02-16 DIAGNOSIS — Z125 Encounter for screening for malignant neoplasm of prostate: Secondary | ICD-10-CM

## 2011-02-16 DIAGNOSIS — E785 Hyperlipidemia, unspecified: Secondary | ICD-10-CM

## 2011-02-16 LAB — BASIC METABOLIC PANEL
BUN: 16 mg/dL (ref 6–23)
CO2: 27 mEq/L (ref 19–32)
Calcium: 9 mg/dL (ref 8.4–10.5)
Chloride: 102 mEq/L (ref 96–112)
Creatinine, Ser: 0.9 mg/dL (ref 0.4–1.5)

## 2011-02-16 LAB — HEMOGLOBIN A1C: Hgb A1c MFr Bld: 6.2 % (ref 4.6–6.5)

## 2011-02-16 LAB — LIPID PANEL
HDL: 29.1 mg/dL — ABNORMAL LOW (ref >39.00)
Total CHOL/HDL Ratio: 4
Triglycerides: 172 mg/dL — ABNORMAL HIGH (ref 0.0–149.0)
VLDL: 34.4 mg/dL (ref 0.0–40.0)

## 2011-02-16 LAB — HEPATIC FUNCTION PANEL
Albumin: 3.8 g/dL (ref 3.5–5.2)
Alkaline Phosphatase: 65 U/L (ref 39–117)
Bilirubin, Direct: 0.1 mg/dL (ref 0.0–0.3)
Total Protein: 6.4 g/dL (ref 6.0–8.3)

## 2011-02-16 LAB — MICROALBUMIN / CREATININE URINE RATIO: Creatinine,U: 156.8 mg/dL

## 2011-02-17 ENCOUNTER — Encounter: Payer: Self-pay | Admitting: Pulmonary Disease

## 2011-02-17 ENCOUNTER — Encounter: Payer: Self-pay | Admitting: *Deleted

## 2011-02-17 ENCOUNTER — Ambulatory Visit (INDEPENDENT_AMBULATORY_CARE_PROVIDER_SITE_OTHER): Payer: Medicare PPO | Admitting: Pulmonary Disease

## 2011-02-17 VITALS — BP 142/70 | HR 80 | Temp 98.0°F | Ht 71.5 in | Wt 263.2 lb

## 2011-02-17 DIAGNOSIS — G4733 Obstructive sleep apnea (adult) (pediatric): Secondary | ICD-10-CM

## 2011-02-17 NOTE — Assessment & Plan Note (Signed)
The pt is doing well on cpap in auto mode, and feels it has helped his sleep and daytime alertness.  He is having no issues with mask fit or pressure.  I have asked him to continue with his cpap, and to work aggressively on weight loss.  He will see me back in one year.

## 2011-02-17 NOTE — Patient Instructions (Signed)
Continue with cpap Work on weight loss followup with me in one year, but call if having issues with cpap.

## 2011-02-17 NOTE — Progress Notes (Signed)
  Subjective:    Patient ID: [name], male    DOB: Sep 22, 1941, 70 y.o.   MRN: [id number]  HPI The pt comes in today for f/u of his known osa.  He is wearing his cpap compliantly on auto mode, and prefers this form of pressure delivery.  He is having no issues with his mask or pressure, and feels he sleeps well with adequate daytime alertness.   Review of Systems  Constitutional: Negative for fever and unexpected weight change.  HENT: Negative for ear pain, nosebleeds, congestion, sore throat, rhinorrhea, sneezing, trouble swallowing, dental problem, postnasal drip and sinus pressure.   Eyes: Negative for redness and itching.  Respiratory: Negative for cough, chest tightness, shortness of breath and wheezing.   Cardiovascular: Negative for palpitations and leg swelling.  Gastrointestinal: Negative for nausea and vomiting.  Genitourinary: Negative for dysuria.  Musculoskeletal: Negative for joint swelling.  Skin: Negative for rash.  Neurological: Negative for headaches.  Hematological: Does not bruise/bleed easily.  Psychiatric/Behavioral: Negative for dysphoric mood. The patient is not nervous/anxious.        Objective:   Physical Exam Ow male in nad No skin breakdown or pressure necrosis from cpap mask Mild LE edema, no cyanosis Alert, oriented, moves all 4.  Does not appear sleepy.        Assessment & Plan:

## 2011-02-23 NOTE — Op Note (Signed)
[name], [name]              ACCOUNT NO.:  [id number]   MEDICAL RECORD NO.:  [id number]          PATIENT TYPE:  OIB   LOCATION:  1534                         FACILITY:  [facility]   PHYSICIAN:  Jene Every, M.D.    DATE OF BIRTH:  Jun 11, 1941   DATE OF PROCEDURE:  11/01/2007  DATE OF DISCHARGE:  11/02/2007                               OPERATIVE REPORT   REDICTATION:   PREOPERATIVE DIAGNOSIS:  Left Achilles tendon rupture, chronic.   POSTOPERATIVE DIAGNOSIS:  Left Achilles tendon rupture, chronic.   PROCEDURE PERFORMED:  Repair of left Achilles tendon rupture, chronic,  with utilization of a soleus/gastrocnemius autograft.   ANESTHESIA:  General.   SURGEON:  Jene Every, M.D.   ASSISTANT:  Roma Schanz, P.A.   BRIEF HISTORY AND INDICATION:  This is a gentleman who is 61.  He has  had a chronic tear of the Achilles tendon.  He is unable to plantarflex;  he has a large gap palpated in the tendon approximately 3 cm from the  insertion of the calcaneus.  He is unable to plantarflex.  Good pulses,  neurovascular intact without evidence of DVT.  He is indicated for  repair of the tendon.  I discussed the risks and benefits including  bleeding, infection, DVT, PE, anesthetic complications, inability to  repair, and need to use a autograft for reconstruction, etc.   DESCRIPTION OF PROCEDURE:  The patient was in supine position.  After  induction of adequate anesthesia and 2 g of Kefzol, he was placed prone  on the operating room table with a bump underneath his ankle and padding  from the lower extremities.  A curvilinear incision on the posterior  aspect of the leg was performed after we exsanguinated the leg following  prepping and draping.  The incision was carried down through the  subcutaneous tissue.  We used electrocautery to achieve hemostasis.  The  sural nerve was identified and protected.  We incised the fascia  overlying the gastroc-soleus complex and the  Achilles tendon.  The tear  was noted at approximately 3-4 cm above the insertion in the calcaneus;  it was a full-thickness tear with separation of the tendon fairly to  about 3 cm with plantarflexion.  There was also an approximation of the  tendon noted, although not full.  I felt that this would require any  interposition-type graft.  I exposed the fascia and the tendon more  proximally near the myotendinous junction and then distally and I  incised and harvested approximately a 1-cm width portion from the  gastroc-soleus expansion of the Achilles tendon, preserving the distal  attachment, reflecting that distally, and then I approximated with #1  Vicryl interrupted figure-of-eight sutures the remaining tendinous unit  behind it.  That was reflected down and preliminarily laid across the  medial side of the tear across the top of the tendon and back through  the tear for appropriate length.  This was saved for the future  reconstruction.  I used the FiberWire in a Krackow-type stitch in the  proximal and distal portions of the tendon tear.  With the  foot in  plantarflexion, we brought the ends together and tied them with the  FiberWire with a good surgeon's knot.  Following that, I reflected the  graft portion of the tendon distally.  I made a horizontal incision in  the remaining tendon stump distally and passed the graft tendon through  and back up through the repair site and used that to reinforce the  repair site with #1 Vicryl interrupted figure-of-eight sutures.  It  appeared that we had good continuity therefore and tendinous continuity  between the proximal and distal fragments.  Wounds was copiously  irrigated.  I dorsiflexed to neutral and there was still continuity of  the construct.  I then repaired the peritenon with 0 Vicryl interrupted  figure-of-eight sutures and covered the repair site as well.  Again, the  sural nerves and neurovascular structures were protected  throughout the  case.  Subcutaneous tissue was reapproximated with 2-0 Vicryl simple  sutures and skin was reapproximated with staples.  The most distal  extent of the incision was curved medially off the posterior aspect of  the calcaneus.  Tourniquet was deflated and there was adequate  vascularization in the lower extremity appreciated.  I placed him in a  well-padded fiberglass cast with the foot in plantarflexion.  He was  then placed on a hospital bed, extubated without difficulty and  transported to the recovery room in satisfactory condition.   The patient tolerated procedure well.  There were no complications.      Jene Every, M.D.  Electronically Signed     JB/MEDQ  D:  12/05/2007  T:  12/06/2007  Job:  [id number]   cc:   c/o Jene Every, MD Estrella Deeds

## 2011-03-16 ENCOUNTER — Other Ambulatory Visit: Payer: Self-pay | Admitting: Family Medicine

## 2011-03-16 MED ORDER — SIMVASTATIN 40 MG PO TABS: 40.0000 mg | ORAL_TABLET | Freq: Every day | ORAL | Status: DC

## 2011-03-16 MED ORDER — TRIAMTERENE-HCTZ 37.5-25 MG PO CAPS: 1.0000 | ORAL_CAPSULE | ORAL | Status: DC

## 2011-03-16 MED ORDER — NIACIN ER (ANTIHYPERLIPIDEMIC) 1000 MG PO TBCR: 1000.0000 mg | EXTENDED_RELEASE_TABLET | Freq: Every day | ORAL | Status: DC

## 2011-03-16 NOTE — Telephone Encounter (Signed)
Rx faxed.    KP 

## 2011-03-18 ENCOUNTER — Other Ambulatory Visit: Payer: Self-pay | Admitting: Family Medicine

## 2011-03-18 MED ORDER — GLIPIZIDE 5 MG PO TABS: 5.0000 mg | ORAL_TABLET | Freq: Every day | ORAL | Status: DC

## 2011-03-18 NOTE — Telephone Encounter (Signed)
[facility]    KP

## 2011-04-12 ENCOUNTER — Other Ambulatory Visit: Payer: Self-pay | Admitting: *Deleted

## 2011-04-12 MED ORDER — NIACIN ER (ANTIHYPERLIPIDEMIC) 1000 MG PO TBCR: 1000.0000 mg | EXTENDED_RELEASE_TABLET | Freq: Every day | ORAL | Status: DC

## 2011-04-26 ENCOUNTER — Other Ambulatory Visit: Payer: Self-pay | Admitting: Family Medicine

## 2011-04-26 MED ORDER — NIACIN ER (ANTIHYPERLIPIDEMIC) 1000 MG PO TBCR: 1000.0000 mg | EXTENDED_RELEASE_TABLET | Freq: Every day | ORAL | Status: DC

## 2011-04-26 MED ORDER — LOSARTAN POTASSIUM 50 MG PO TABS: 50.0000 mg | ORAL_TABLET | Freq: Every day | ORAL | Status: DC

## 2011-04-26 MED ORDER — GLIPIZIDE 5 MG PO TABS: 5.0000 mg | ORAL_TABLET | Freq: Every day | ORAL | Status: DC

## 2011-04-26 NOTE — Telephone Encounter (Signed)
done

## 2011-05-17 ENCOUNTER — Telehealth: Payer: Self-pay | Admitting: Family Medicine

## 2011-05-17 NOTE — Telephone Encounter (Signed)
Discussed with patient and he said he had an Rx for Niacin 1000 mg Cr and Niaspan 1000mg  Cr and I advised patient this was the same thing, he voiced understanding.  Scheduled his appt for repeat labs  KP

## 2011-05-20 ENCOUNTER — Other Ambulatory Visit: Payer: Medicare PPO

## 2011-05-21 ENCOUNTER — Other Ambulatory Visit: Payer: Self-pay | Admitting: Family Medicine

## 2011-05-21 DIAGNOSIS — E119 Type 2 diabetes mellitus without complications: Secondary | ICD-10-CM

## 2011-05-21 DIAGNOSIS — E785 Hyperlipidemia, unspecified: Secondary | ICD-10-CM

## 2011-05-24 ENCOUNTER — Other Ambulatory Visit: Payer: Medicare PPO

## 2011-05-24 ENCOUNTER — Telehealth: Payer: Self-pay | Admitting: Family Medicine

## 2011-05-24 ENCOUNTER — Other Ambulatory Visit (INDEPENDENT_AMBULATORY_CARE_PROVIDER_SITE_OTHER): Payer: Medicare PPO

## 2011-05-24 DIAGNOSIS — E785 Hyperlipidemia, unspecified: Secondary | ICD-10-CM

## 2011-05-24 DIAGNOSIS — E119 Type 2 diabetes mellitus without complications: Secondary | ICD-10-CM

## 2011-05-24 LAB — LIPID PANEL
HDL: 37.7 mg/dL — ABNORMAL LOW (ref >39.00)
Total CHOL/HDL Ratio: 3
Triglycerides: 136 mg/dL (ref 0.0–149.0)
VLDL: 27.2 mg/dL (ref 0.0–40.0)

## 2011-05-24 LAB — BASIC METABOLIC PANEL
CO2: 27 mEq/L (ref 19–32)
Calcium: 9.7 mg/dL (ref 8.4–10.5)
Glucose, Bld: 155 mg/dL — ABNORMAL HIGH (ref 70–99)
Potassium: 3.6 mEq/L (ref 3.5–5.1)
Sodium: 139 mEq/L (ref 135–145)

## 2011-05-24 LAB — HEPATIC FUNCTION PANEL
ALT: 26 U/L (ref 0–53)
AST: 21 U/L (ref 0–37)
Albumin: 4.4 g/dL (ref 3.5–5.2)
Total Bilirubin: 1.4 mg/dL — ABNORMAL HIGH (ref 0.3–1.2)

## 2011-05-24 LAB — HEMOGLOBIN A1C: Hgb A1c MFr Bld: 4.8 % (ref 4.6–6.5)

## 2011-05-24 NOTE — Progress Notes (Signed)
Labs only

## 2011-05-25 LAB — MICROALBUMIN / CREATININE URINE RATIO
Creatinine,U: 220.2 mg/dL
Microalb, Ur: 0.8 mg/dL (ref 0.0–1.9)

## 2011-05-27 ENCOUNTER — Telehealth: Payer: Self-pay | Admitting: *Deleted

## 2011-05-27 ENCOUNTER — Encounter: Payer: Self-pay | Admitting: *Deleted

## 2011-05-27 NOTE — Telephone Encounter (Signed)
Error

## 2011-05-31 ENCOUNTER — Telehealth: Payer: Self-pay | Admitting: Pulmonary Disease

## 2011-05-31 NOTE — Telephone Encounter (Signed)
Results mailed 05/27/11     KP

## 2011-05-31 NOTE — Telephone Encounter (Signed)
Spoke with pt. He states has lost 20 lbs and has been sleeping without CPAP for several wks. Would like to know what to do as far as tx for OSA now. Please advise, thanks!

## 2011-05-31 NOTE — Telephone Encounter (Signed)
When I saw him in may, he weighed 263 pounds.  His goal weight for getting rid of cpap is 200 pounds.  Pt's often will stop cpap for a few weeks and do ok, then the symptoms start creeping back in.  Would recommend staying on cpap until he is down near 200.

## 2011-06-01 NOTE — Telephone Encounter (Signed)
Informed pt of KC's recs and pt stated he understood but will stay off until he notices sx return. Julaine Hua, CMA

## 2011-06-01 NOTE — Telephone Encounter (Signed)
Pt returned call. [name]  

## 2011-06-01 NOTE — Telephone Encounter (Signed)
[id number] x1 

## 2011-06-22 ENCOUNTER — Encounter: Payer: Self-pay | Admitting: Gastroenterology

## 2011-07-01 LAB — DIFFERENTIAL
Basophils Relative: 1
Eosinophils Absolute: 0.2
Eosinophils Relative: 2
Lymphocytes Relative: 26
Neutrophils Relative %: 63

## 2011-07-01 LAB — CBC
MCHC: 34.8
MCV: 93.5
Platelets: 150
RBC: 4.53

## 2011-07-01 LAB — URINALYSIS, ROUTINE W REFLEX MICROSCOPIC
Bilirubin Urine: NEGATIVE
Nitrite: NEGATIVE
Protein, ur: NEGATIVE
Specific Gravity, Urine: 1.019
Urobilinogen, UA: 0.2

## 2011-07-01 LAB — COMPREHENSIVE METABOLIC PANEL
ALT: 24
AST: 22
Albumin: 4
CO2: 26
Calcium: 9.4
Creatinine, Ser: 0.98
GFR calc Af Amer: >60
Sodium: 140
Total Protein: 6.7

## 2011-07-01 LAB — APTT: aPTT: 35

## 2011-07-01 LAB — PROTIME-INR
INR: 1
Prothrombin Time: 13.4

## 2011-09-15 ENCOUNTER — Encounter: Payer: Self-pay | Admitting: Family Medicine

## 2011-09-16 ENCOUNTER — Encounter: Payer: Self-pay | Admitting: Family Medicine

## 2011-09-16 ENCOUNTER — Ambulatory Visit (INDEPENDENT_AMBULATORY_CARE_PROVIDER_SITE_OTHER): Payer: Medicare PPO | Admitting: Family Medicine

## 2011-09-16 DIAGNOSIS — R319 Hematuria, unspecified: Secondary | ICD-10-CM

## 2011-09-16 DIAGNOSIS — Z0000 Encounter for general adult medical examination without abnormal findings: Secondary | ICD-10-CM

## 2011-09-16 DIAGNOSIS — I10 Essential (primary) hypertension: Secondary | ICD-10-CM

## 2011-09-16 DIAGNOSIS — E785 Hyperlipidemia, unspecified: Secondary | ICD-10-CM

## 2011-09-16 DIAGNOSIS — Z125 Encounter for screening for malignant neoplasm of prostate: Secondary | ICD-10-CM

## 2011-09-16 DIAGNOSIS — Z23 Encounter for immunization: Secondary | ICD-10-CM

## 2011-09-16 DIAGNOSIS — E119 Type 2 diabetes mellitus without complications: Secondary | ICD-10-CM

## 2011-09-16 LAB — CBC WITH DIFFERENTIAL/PLATELET
Basophils Absolute: 0 10*3/uL (ref 0.0–0.1)
Eosinophils Relative: 2.9 % (ref 0.0–5.0)
HCT: 44.8 % (ref 39.0–52.0)
Lymphocytes Relative: 23.7 % (ref 12.0–46.0)
Lymphs Abs: 2.1 10*3/uL (ref 0.7–4.0)
Monocytes Relative: 6.5 % (ref 3.0–12.0)
Neutrophils Relative %: 66.4 % (ref 43.0–77.0)
Platelets: 202 10*3/uL (ref 150.0–400.0)
RDW: 13.6 % (ref 11.5–14.6)
WBC: 8.9 10*3/uL (ref 4.5–10.5)

## 2011-09-16 LAB — POCT URINALYSIS DIPSTICK: Urobilinogen, UA: 0.2

## 2011-09-16 LAB — HEPATIC FUNCTION PANEL
AST: 23 U/L (ref 0–37)
Albumin: 4.5 g/dL (ref 3.5–5.2)
Alkaline Phosphatase: 69 U/L (ref 39–117)
Bilirubin, Direct: 0.3 mg/dL (ref 0.0–0.3)
Total Bilirubin: 1.6 mg/dL — ABNORMAL HIGH (ref 0.3–1.2)

## 2011-09-16 LAB — LIPID PANEL
HDL: 42.2 mg/dL (ref >39.00)
LDL Cholesterol: 80 mg/dL (ref 0–99)
VLDL: 18.6 mg/dL (ref 0.0–40.0)

## 2011-09-16 LAB — BASIC METABOLIC PANEL
BUN: 16 mg/dL (ref 6–23)
Calcium: 9.3 mg/dL (ref 8.4–10.5)
GFR: 91.1 mL/min (ref >60.00)
Glucose, Bld: 153 mg/dL — ABNORMAL HIGH (ref 70–99)
Potassium: 4.3 mEq/L (ref 3.5–5.1)

## 2011-09-16 LAB — MICROALBUMIN / CREATININE URINE RATIO
Creatinine,U: 234.5 mg/dL
Microalb, Ur: 0.5 mg/dL (ref 0.0–1.9)

## 2011-09-16 LAB — PSA: PSA: 5.55 ng/mL — ABNORMAL HIGH (ref 0.10–4.00)

## 2011-09-16 LAB — HEMOGLOBIN A1C: Hgb A1c MFr Bld: 5.6 % (ref 4.6–6.5)

## 2011-09-16 NOTE — Patient Instructions (Signed)
Preventative Care for Adults, Male A healthy lifestyle and preventative care can promote health and wellness. Preventative health guidelines for men include the following key practices:  A routine yearly physical is a good way to check with your caregiver about your health and preventative screening. It is a chance to share any concerns and updates on your health, and to receive a thorough exam.   Visit your dentist for a routine exam and preventative care every 6 months. Brush your teeth twice a day and floss once a day. Good oral hygiene prevents tooth decay and gum disease.   The frequency of eye exams is based on your age, health, family medical history, use of contact lenses, and other factors. Follow your caregiver's recommendations for frequency of eye exams.   Eat a healthy diet. Foods like vegetables, fruits, whole grains, low-fat dairy products, and lean protein foods contain the nutrients you need without too many calories. Decrease your intake of foods high in solid fats, added sugars, and salt. Eat the right amount of calories for you.Get information about a proper diet from your caregiver, if necessary.   Regular physical exercise is one of the most important things you can do for your health. Most adults should get at least 150 minutes of moderate-intensity exercise (any activity that increases your heart rate and causes you to sweat) each week. In addition, most adults need muscle-strengthening exercises on 2 or more days a week.   Maintain a healthy weight. The body mass index (BMI) is a screening tool to identify possible weight problems. It provides an estimate of body fat based on height and weight. Your caregiver can help determine your BMI, and can help you achieve or maintain a healthy weight.For adults 20 years and older:   A BMI below 18.5 is considered underweight.   A BMI of 18.5 to 24.9 is normal.   A BMI of 25 to 29.9 is considered overweight.   A BMI of 30 and  above is considered obese.   Maintain normal blood lipids and cholesterol levels by exercising and minimizing your intake of saturated fat. Eat a balanced diet with plenty of fruit and vegetables. Blood tests for lipids and cholesterol should begin at age 20 and be repeated every 5 years. If your lipid or cholesterol levels are high, you are over 50, or you are a high risk for heart disease, you may need your cholesterol levels checked more frequently.Ongoing high lipid and cholesterol levels should be treated with medicines if diet and exercise are not effective.   If you smoke, find out from your caregiver how to quit. If you do not use tobacco, do not start.   If you choose to drink alcohol, do not exceed 2 drinks per day. One drink is considered to be 12 ounces (355 mL) of beer, 5 ounces (148 mL) of wine, or 1.5 ounces (44 mL) of liquor.   Avoid use of street drugs. Do not share needles with anyone. Ask for help if you need support or instructions about stopping the use of drugs.   High blood pressure causes heart disease and increases the risk of stroke. Your blood pressure should be checked at least every 1 to 2 years. Ongoing high blood pressure should be treated with medicines, if weight loss and exercise are not effective.   If you are 45 to 69 years old, ask your caregiver if you should take aspirin to prevent heart disease.   Diabetes screening involves taking a blood   sample to check your fasting blood sugar level. This should be done once every 3 years, after age 45, if you are within normal weight and without risk factors for diabetes. Testing should be considered at a younger age or be carried out more frequently if you are overweight and have at least 1 risk factor for diabetes.   Colorectal cancer can be detected and often prevented. Most routine colorectal cancer screening begins at the age of 50 and continues through age 75. However, your caregiver may recommend screening at an  earlier age if you have risk factors for colon cancer. On a yearly basis, your caregiver may provide home test kits to check for hidden blood in the stool. Use of a small camera at the end of a tube, to directly examine the colon (sigmoidoscopy or colonoscopy), can detect the earliest forms of colorectal cancer. Talk to your caregiver about this at age 50, when routine screening begins. Direct examination of the colon should be repeated every 5 to 10 years through age 75, unless early forms of pre-cancerous polyps or small growths are found.   Practice safe sex. Use condoms and avoid high-risk sexual practices to reduce the spread of sexually transmitted infections (STIs). STIs include gonorrhea, chlamydia, syphilis, trichomonas, herpes, HPV, and human immunodeficiency virus (HIV). Herpes, HIV, and HPV are viral illnesses that have no cure. They can result in disability, cancer, and death.   A one-time screening for abdominal aortic aneurysm (AAA) and surgical repair of large AAAs by sound wave imaging (ultrasonography) is recommended for ages 65 to 75 years who are current or former smokers.   Healthy men should no longer receive prostate-specific antigen (PSA) blood tests as part of routine cancer screening. Consult with your caregiver about prostate cancer screening.   Use sunscreen with skin protection factor (SPF) of 30 or more. Apply sunscreen liberally and repeatedly throughout the day. You should seek shade when your shadow is shorter than you. Protect yourself by wearing long sleeves, pants, a wide-brimmed hat, and sunglasses year round, whenever you are outdoors.   Once a month, do a whole body skin exam, using a mirror to look at the skin on your back. Notify your caregiver of new moles, moles that have irregular borders, moles that are larger than a pencil eraser, or moles that have changed in shape or color.   Stay current with required immunizations.   Influenza. You need a dose every  fall (or winter). The composition of the flu vaccine changes each year, so being vaccinated once is not enough.   Pneumococcal polysaccharide. You need 1 to 2 doses if you smoke cigarettes or if you have certain chronic medical conditions. You need 1 dose at age 65 (or older) if you have never been vaccinated.   Tetanus, diphtheria, pertussis (Tdap, Td). Get 1 dose of Tdap vaccine if you are younger than age 65 years, are over 65 and have contact with an infant, are a healthcare worker, or simply want to be protected from whooping cough. After that, you need a Td booster dose every 10 years. Consult your caregiver if you have not had at least 3 tetanus and diphtheria-containing shots sometime in your life or have a deep or dirty wound.   HPV. This vaccine is recommended for males 13 through 69 years of age. This vaccine may be given to men 22 through 69 years of age who have not completed the 3 dose series. It is recommended for men through age 26   who have sex with men or whose immune system is weakened because of HIV infection, other illness, or medications. The vaccine is given in 3 doses over 6 months.   Measles, mumps, rubella (MMR). You need at least 1 dose of MMR if you were born in 1957 or later. You may also need a 2nd dose.   Meningococcal. If you are age 19 to 21 years and a first-year college student living in a residence hall, or have one of several medical conditions, you need to get vaccinated against meningococcal disease. You may also need additional booster doses.   Zoster (shingles). If you are age 60 years or older, you should get this vaccine.   Varicella (chickenpox). If you have never had chickenpox or you were vaccinated but received only 1 dose, talk to your caregiver to find out if you need this vaccine.   Hepatitis A. You need this vaccine if you have a specific risk factor for hepatitis A virus infection, or you simply wish to be protected from this disease. The vaccine is  usually given as 2 doses, 6 to 18 months apart.   Hepatitis B. You need this vaccine if you have a specific risk factor for hepatitis B virus infection or you simply wish to be protected from this disease. The vaccine is given in 3 doses, usually over 6 months.  Preventative Service / Frequency Ages 19 to 39  Blood pressure check.** / Every 1 to 2 years.   Lipid and cholesterol check.**/ Every 5 years beginning at age 20.   Skin self-exam. / Monthly.   Influenza immunization.** / Every year.   Pneumococcal polysaccharide immunization.** / 1 to 2 doses if you smoke cigarettes or if you have certain chronic medical conditions.   Tetanus, diphtheria, pertussis (Tdap,Td) immunization. / A one-time dose of Tdap vaccine. After that, you need a Td booster dose every 10 years.   HPV immunization. / 3 doses over 6 months, if 26 and younger.   Measles, mumps, rubella (MMR) immunization. / You need at least 1 dose of MMR if you were born in 1957 or later. You may also need a 2nd dose.   Meningococcal immunization. / 1 dose if you are age 19 to 21 years and a first-year college student living in a residence hall, or have one of several medical conditions, you need to get vaccinated against meningococcal disease. You may also need additional booster doses.   Varicella immunization. **/ Consult your caregiver.   Hepatitis A immunization. ** / Consult your caregiver. 2 doses, 6 to 18 months apart.   Hepatitis B immunization.** / Consult your caregiver. 3 doses usually over 6 months.  Ages 40 to 64  Blood pressure check.** / Every 1 to 2 years.   Lipid and cholesterol check.**/ Every 5 years beginning at age 20.   Fecal occult blood test (FOBT) of stool. / Every year beginning at age 50 and continuing until age 75. You may not have to do this test if you get colonoscopy every 10 years.   Flexible sigmoidoscopy** or colonoscopy.** / Every 5 years for a flexible sigmoidoscopy or every 10 years for  a colonoscopy beginning at age 50 and continuing until age 75.   Skin self-exam. / Monthly.   Influenza immunization.** / Every year.   Pneumococcal polysaccharide immunization.** / 1 to 2 doses if you smoke cigarettes or if you have certain chronic medical conditions.   Tetanus, diphtheria, pertussis (Tdap/Td) immunization.** / A one-time dose of   Tdap vaccine. After that, you need a Td booster dose every 10 years.   Measles, mumps, rubella (MMR) immunization. / You need at least 1 dose of MMR if you were born in 1957 or later. You may also need a 2nd dose.   Varicella immunization. **/ Consult your caregiver.   Meningococcal immunization.** / Consult your caregiver.   Hepatitis A immunization. ** / Consult your caregiver. 2 doses, 6 to 18 months apart.   Hepatitis B immunization.** / Consult your caregiver. 3 doses, usually over 6 months.  Ages 65 and over  Blood pressure check.** / Every 1 to 2 years.   Lipid and cholesterol check.**/ Every 5 years beginning at age 20.   Fecal occult blood test (FOBT) of stool. / Every year beginning at age 50 and continuing until age 75. You may not have to do this test if you get colonoscopy every 10 years.   Flexible sigmoidoscopy** or colonoscopy.** / Every 5 years for a flexible sigmoidoscopy or every 10 years for a colonoscopy beginning at age 50 and continuing until age 75.   Abdominal aortic aneurysm (AAA) screening.** / A one-time screening for ages 65 to 75 years who are current or former smokers.   Skin self-exam. / Monthly.   Influenza immunization.** / Every year.   Pneumococcal polysaccharide immunization.** / 1 dose at age 65 (or older) if you have never been vaccinated.   Tetanus, diphtheria, pertussis (Tdap, Td) immunization. / A one-time dose of Tdap vaccine if you are over 65 and have contact with an infant, are a healthcare worker, or simply want to be protected from whooping cough. After that, you need a Td booster dose  every 10 years.   Varicella immunization. **/ Consult your caregiver.   Meningococcal immunization.** / Consult your caregiver.   Hepatitis A immunization. ** / Consult your caregiver. 2 doses, 6 to 18 months apart.   Hepatitis B immunization.** / Check with your caregiver. 3 doses, usually over 6 months.  **Family history and personal history of risk and conditions may change your caregiver's recommendations. Document Released: 11/23/2001 Document Revised: 06/09/2011 Document Reviewed: 02/22/2011 [employer] Patient Information 2012 [employer]. 

## 2011-09-16 NOTE — Progress Notes (Signed)
Subjective:    [name] is a 70 y.o. male who presents for Medicare Annual/Subsequent preventive examination.   Preventive Screening-Counseling & Management  Tobacco History  Smoking status  . Former Smoker -- 0.3 packs/day for 25 years  . Quit date: 10/11/1984  Smokeless tobacco  . Not on file    Problems Prior to Visit 1.   Current Problems (verified) Patient Active Problem List  Diagnoses  . POLYP, COLON  . DIABETES MELLITUS, TYPE II  . HYPERLIPIDEMIA  . OBSTRUCTIVE SLEEP APNEA  . HYPERTENSION  . DIVERTICULOSIS, COLON  . LOW BACK PAIN SYNDROME  . PSA, INCREASED  . NECK SPRAIN AND STRAIN    Medications Prior to Visit Current Outpatient Prescriptions on File Prior to Visit  Medication Sig Dispense Refill  . Ascorbic Acid (VITAMIN C) 500 MG tablet Take 500 mg by mouth every other day.        Marland Kitchen aspirin 81 MG tablet Take 81 mg by mouth daily.        Marland Kitchen glipiZIDE (GLUCOTROL) 5 MG tablet Take 1 tablet (5 mg total) by mouth daily.  90 tablet  0  . ibuprofen (ADVIL,MOTRIN) 200 MG tablet Take 200 mg by mouth daily.        Marland Kitchen losartan (COZAAR) 50 MG tablet Take 1 tablet (50 mg total) by mouth daily.  90 tablet  0  . metFORMIN (GLUCOPHAGE-XR) 500 MG 24 hr tablet Take 1,500 mg by mouth 2 (two) times daily.       . niacin (NIASPAN) 1000 MG CR tablet Take 1 tablet (1,000 mg total) by mouth daily.  90 tablet  0  . Omega-3 Fatty Acids (FISH OIL) 1200 MG CAPS Take 2 capsules by mouth 2 (two) times daily.        . ONE TOUCH ULTRA TEST test strip CHECK BLOOD SUGAR TWICE DAILY  100 each  1  . sildenafil (VIAGRA) 100 MG tablet as directed.        . simvastatin (ZOCOR) 40 MG tablet Take 1 tablet (40 mg total) by mouth at bedtime.  90 tablet  1  . triamterene-hydrochlorothiazide (DYAZIDE) 37.5-25 MG per capsule Take 1 capsule by mouth every morning.  90 capsule  1    Current Medications (verified) Current Outpatient Prescriptions  Medication Sig Dispense Refill  . Ascorbic Acid  (VITAMIN C) 500 MG tablet Take 500 mg by mouth every other day.        Marland Kitchen aspirin 81 MG tablet Take 81 mg by mouth daily.        Marland Kitchen glipiZIDE (GLUCOTROL) 5 MG tablet Take 1 tablet (5 mg total) by mouth daily.  90 tablet  0  . ibuprofen (ADVIL,MOTRIN) 200 MG tablet Take 200 mg by mouth daily.        Marland Kitchen losartan (COZAAR) 50 MG tablet Take 1 tablet (50 mg total) by mouth daily.  90 tablet  0  . metFORMIN (GLUCOPHAGE-XR) 500 MG 24 hr tablet Take 1,500 mg by mouth 2 (two) times daily.       . niacin (NIASPAN) 1000 MG CR tablet Take 1 tablet (1,000 mg total) by mouth daily.  90 tablet  0  . Omega-3 Fatty Acids (FISH OIL) 1200 MG CAPS Take 2 capsules by mouth 2 (two) times daily.        . ONE TOUCH ULTRA TEST test strip CHECK BLOOD SUGAR TWICE DAILY  100 each  1  . sildenafil (VIAGRA) 100 MG tablet as directed.        Marland Kitchen  simvastatin (ZOCOR) 40 MG tablet Take 1 tablet (40 mg total) by mouth at bedtime.  90 tablet  1  . triamterene-hydrochlorothiazide (DYAZIDE) 37.5-25 MG per capsule Take 1 capsule by mouth every morning.  90 capsule  1     Allergies (verified) Review of patient's allergies indicates no known allergies.   PAST HISTORY  Family History Family History  Problem Relation Age of Onset  . Cancer Father   . Heart disease Mother   . Cancer Other     cervical    Social History History  Substance Use Topics  . Smoking status: Former Smoker -- 0.3 packs/day for 25 years    Quit date: 10/11/1984  . Smokeless tobacco: Not on file  . Alcohol Use: Yes    Are there smokers in your home (other than you)?  No  Risk Factors Current exercise habits: Home exercise routine includes walking 1/2 hrs per day.  Dietary issues discussed: na   Cardiac risk factors: advanced age (older than 17 for men, 55 for women), diabetes mellitus, dyslipidemia, hypertension, male gender and obesity (BMI >= 30 kg/m2).  Depression Screen (Note: if answer to either of the following is "Yes", a more complete  depression screening is indicated)   Q1: Over the past two weeks, have you felt down, depressed or hopeless? No  Q2: Over the past two weeks, have you felt little interest or pleasure in doing things? No  Have you lost interest or pleasure in daily life? No  Do you often feel hopeless? No  Do you cry easily over simple problems? No  Activities of Daily Living In your present state of health, do you have any difficulty performing the following activities?:  Driving? No Managing money?  No Feeding yourself? No Getting from bed to chair? No Climbing a flight of stairs? No Preparing food and eating?: No Bathing or showering? No Getting dressed: No Getting to the toilet? No Using the toilet:No Moving around from place to place: No In the past year have you fallen or had a near fall?:No   Are you sexually active?  Yes  Do you have more than one partner?  No  Hearing Difficulties: No Do you often ask people to speak up or repeat themselves? No Do you experience ringing or noises in your ears? No Do you have difficulty understanding soft or whispered voices? No   Do you feel that you have a problem with memory? No  Do you often misplace items? No  Do you feel safe at home?  Yes  Cognitive Testing  Alert? Yes  Normal Appearance?Yes  Oriented to person? Yes  Place? Yes   Time? Yes  Recall of three objects?  Yes  Can perform simple calculations? Yes  Displays appropriate judgment?Yes  Can read the correct time from a watch face?Yes   Advanced Directives have been discussed with the patient? Yes   List the Names of Other Physician/Practitioners you currently use: 1.  Endo--Balan 2   Urology--puchinsky 3  Pulm---  clance 4 opt-- Allen 5 dentist 6 derm--McConnell Indicate any recent Medical Services you may have received from other than [facility] providers in the past year (date may be approximate).  Immunization History  Administered Date(s) Administered  . Influenza Whole  08/18/2007, 08/02/2008, 07/18/2009, 06/18/2010  . Pneumococcal Polysaccharide 06/12/2003, 11/27/2009  . Td 09/23/1999, 09/24/2008  . Zoster 08/18/2007    Screening Tests Health Maintenance  Topic Date Due  . Influenza Vaccine  07/11/2012  . Colonoscopy  06/30/2016  .  Tetanus/tdap  09/24/2018  . Pneumococcal Polysaccharide Vaccine Age 51 And Over  Completed  . Zostavax  Completed    All answers were reviewed with the patient and necessary referrals were made:  Loreen Freud, DO   09/16/2011   History reviewed: allergies, current medications, past family history, past medical history, past social history, past surgical history and problem list  Review of Systems  Review of Systems  Constitutional: Negative for activity change, appetite change and fatigue.  HENT: Negative for hearing loss, congestion, tinnitus and ear discharge.  dentist--q58m Eyes: Negative for visual disturbance (see optho q1y -- vision corrected to 20/20 with glasses).  Respiratory: Negative for cough, chest tightness and shortness of breath.   Cardiovascular: Negative for chest pain, palpitations and leg swelling.  Gastrointestinal: Negative for abdominal pain, diarrhea, constipation and abdominal distention.  Genitourinary: Negative for urgency, frequency, decreased urine volume and difficulty urinating.  Musculoskeletal: Negative for back pain, arthralgias and gait problem.  Skin: Negative for color change, pallor and rash.  Neurological: Negative for dizziness, light-headedness, numbness and headaches.  Hematological: Negative for adenopathy. Does not bruise/bleed easily.  Psychiatric/Behavioral: Negative for suicidal ideas, confusion, sleep disturbance, self-injury, dysphoric mood, decreased concentration and agitation.  Pt is able to read and write and can do all ADLs No risk for falling No abuse/ violence in home      Objective:     Vision by Snellen chart: right WUJ:WJXB, left JYN:WGNFA Blood  pressure 142/90, pulse 79, temperature 98.8 F (37.1 C), temperature source Oral, height 5' 11.5" (1.816 m), weight 236 lb (107.049 kg), SpO2 97.00%. Body mass index is 32.46 kg/(m^2).  BP 142/90  Pulse 79  Temp(Src) 98.8 F (37.1 C) (Oral)  Ht 5' 11.5" (1.816 m)  Wt 236 lb (107.049 kg)  BMI 32.46 kg/m2  SpO2 97%  General Appearance:    Alert, cooperative, no distress, appears stated age  Head:    Normocephalic, without obvious abnormality, atraumatic  Eyes:    PERRL, conjunctiva/corneas clear, EOM's intact, fundi    benign, both eyes       Ears:    Normal TM's and external ear canals, both ears  Nose:   Nares normal, septum midline, mucosa normal, no drainage    or sinus tenderness  Throat:   Lips, mucosa, and tongue normal; teeth and gums normal  Neck:   Supple, symmetrical, trachea midline, no adenopathy;       thyroid:  No enlargement/tenderness/nodules; no carotid   bruit or JVD  Back:     Symmetric, no curvature, ROM normal, no CVA tenderness  Lungs:     Clear to auscultation bilaterally, respirations unlabored  Chest wall:    No tenderness or deformity  Heart:    Regular rate and rhythm, S1 and S2 normal, no murmur, rub   or gallop  Abdomen:     Soft, non-tender, bowel sounds active all four quadrants,    no masses, no organomegaly  Genitalia:    Normal male without lesion, discharge or tenderness  Rectal:    Normal tone, normal prostate, no masses or tenderness;   guaiac negative stool  Extremities:   Extremities normal, atraumatic, no cyanosis or edema  Pulses:   2+ and symmetric all extremities  Skin:   Skin color, texture, turgor normal, no rashes or lesions  Lymph nodes:   Cervical, supraclavicular, and axillary nodes normal  Neurologic:   CNII-XII intact. Normal strength, sensation and reflexes      throughout  Assessment:     cpe htn-- slightly high today,  Watch salt, exercise DmII-- check labs, con't meds    hyperlipidemia--con't meds Plan:       During the course of the visit the patient was educated and counseled about appropriate screening and preventive services including:    Pneumococcal vaccine   Influenza vaccine  Hepatitis B vaccine  Td vaccine  Colorectal cancer screening  Diabetes screening  Nutrition counseling   Advanced directives: has an advanced directive - a copy HAS NOT been provided.  Diet review for nutrition referral? Yes ____  Not Indicated ___X_   Patient Instructions (the written plan) was given to the patient.  Medicare Attestation I have personally reviewed: The patient's medical and social history Their use of alcohol, tobacco or illicit drugs Their current medications and supplements The patient's functional ability including ADLs,fall risks, home safety risks, cognitive, and hearing and visual impairment Diet and physical activities Evidence for depression or mood disorders  The patient's weight, height, BMI, and visual acuity have been recorded in the chart.  I have made referrals, counseling, and provided education to the patient based on review of the above and I have provided the patient with a written personalized care plan for preventive services.     Loreen Freud, DO   09/16/2011

## 2011-09-18 LAB — URINE CULTURE: Colony Count: NO GROWTH

## 2011-09-20 ENCOUNTER — Telehealth: Payer: Self-pay | Admitting: Family Medicine

## 2011-09-20 NOTE — Telephone Encounter (Signed)
Patient has appt with dr Alfredia Ferguson today he needs lab result from [phone] - patient wants it Faxed to his home fax [phone]

## 2011-09-20 NOTE — Telephone Encounter (Signed)
Call from Dr.Puschinsky's office requesting a copy of the patient most recent labs. Please advise if it is ok to fax  To [phone]  KP

## 2011-09-20 NOTE — Telephone Encounter (Signed)
Sent via epic     KP

## 2011-09-20 NOTE — Telephone Encounter (Signed)
Ok to fax-

## 2011-09-30 ENCOUNTER — Telehealth: Payer: Self-pay | Admitting: Family Medicine

## 2011-10-01 ENCOUNTER — Other Ambulatory Visit: Payer: Self-pay | Admitting: Family Medicine

## 2011-10-01 MED ORDER — NIACIN ER (ANTIHYPERLIPIDEMIC) 1000 MG PO TBCR: 1000.0000 mg | EXTENDED_RELEASE_TABLET | Freq: Every day | ORAL | Status: DC

## 2011-10-01 NOTE — Telephone Encounter (Signed)
Faxed.   KP 

## 2011-10-04 ENCOUNTER — Other Ambulatory Visit: Payer: Self-pay | Admitting: Family Medicine

## 2011-10-04 MED ORDER — TRIAMTERENE-HCTZ 37.5-25 MG PO CAPS: 1.0000 | ORAL_CAPSULE | ORAL | Status: DC

## 2011-10-04 NOTE — Telephone Encounter (Signed)
Mailed on12/18    KP

## 2011-10-04 NOTE — Telephone Encounter (Signed)
Faxed to [facility]        KP 

## 2011-11-18 ENCOUNTER — Ambulatory Visit: Payer: Medicare PPO | Admitting: Family Medicine

## 2011-12-13 ENCOUNTER — Other Ambulatory Visit: Payer: Self-pay

## 2011-12-13 NOTE — Telephone Encounter (Signed)
Fax requesting test strips-- per patient he will get them from Dr.Balan since he is his Endocrinologist.     KP

## 2011-12-30 ENCOUNTER — Ambulatory Visit (INDEPENDENT_AMBULATORY_CARE_PROVIDER_SITE_OTHER): Payer: Medicare Other | Admitting: Family Medicine

## 2011-12-30 ENCOUNTER — Other Ambulatory Visit: Payer: Self-pay | Admitting: *Deleted

## 2011-12-30 ENCOUNTER — Encounter: Payer: Self-pay | Admitting: Family Medicine

## 2011-12-30 VITALS — BP 156/84 | HR 99 | Wt 230.8 lb

## 2011-12-30 DIAGNOSIS — E785 Hyperlipidemia, unspecified: Secondary | ICD-10-CM

## 2011-12-30 DIAGNOSIS — I10 Essential (primary) hypertension: Secondary | ICD-10-CM

## 2011-12-30 DIAGNOSIS — N400 Enlarged prostate without lower urinary tract symptoms: Secondary | ICD-10-CM

## 2011-12-30 DIAGNOSIS — E119 Type 2 diabetes mellitus without complications: Secondary | ICD-10-CM

## 2011-12-30 DIAGNOSIS — N529 Male erectile dysfunction, unspecified: Secondary | ICD-10-CM

## 2011-12-30 DIAGNOSIS — R972 Elevated prostate specific antigen [PSA]: Secondary | ICD-10-CM

## 2011-12-30 MED ORDER — NIACIN ER (ANTIHYPERLIPIDEMIC) 500 MG PO TBCR: 500.0000 mg | EXTENDED_RELEASE_TABLET | Freq: Every day | ORAL | Status: AC

## 2011-12-30 MED ORDER — LOSARTAN POTASSIUM 100 MG PO TABS: 100.0000 mg | ORAL_TABLET | Freq: Every day | ORAL | Status: DC

## 2011-12-30 MED ORDER — TRIAMTERENE-HCTZ 37.5-25 MG PO CAPS: 1.0000 | ORAL_CAPSULE | ORAL | Status: DC

## 2011-12-30 MED ORDER — SIMVASTATIN 40 MG PO TABS: 40.0000 mg | ORAL_TABLET | Freq: Every day | ORAL | Status: DC

## 2011-12-30 MED ORDER — NIACIN ER (ANTIHYPERLIPIDEMIC) 500 MG PO TBCR: 500.0000 mg | EXTENDED_RELEASE_TABLET | Freq: Every day | ORAL | Status: DC

## 2011-12-30 MED ORDER — SILDENAFIL CITRATE 100 MG PO TABS: ORAL_TABLET | ORAL | Status: DC

## 2011-12-30 MED ORDER — SILDENAFIL CITRATE 100 MG PO TABS: 100.0000 mg | ORAL_TABLET | ORAL | Status: DC | PRN

## 2011-12-30 NOTE — Progress Notes (Signed)
  Subjective:    Patient ID: [name], male    DOB: 23-Aug-1941, 71 y.o.   MRN: [id number]  HPI Pt here for f/u.   Pt would like to switch to a different urologist.  Otherwise no complaints. HYPERTENSION Disease Monitoring Blood pressure range-138/83-172/88 Chest pain- no      Dyspnea- no Medications Compliance- good Lightheadedness- no   Edema- no    DIABETES Disease Monitoring Blood Sugar ranges-158 and below Polyuria- no New Visual problems- no Medications Compliance- good Hypoglycemic symptoms- no   HYPERLIPIDEMIA Disease Monitoring See symptoms for Hypertension Medications Compliance- good-- he had to lower dose because of hot flashes RUQ pain- no  Muscle aches- no  ROS See HPI above   PMH Smoking Status noted     Review of Systems as above--- and pt c/o pain in thumbs and dec strength,  no injury.   pt with any motion, not worse in am   Pt also c/o diarrhea for about 3 days but it has resolved.   Objective:   Physical Exam  Constitutional: He is oriented to person, place, and time. He appears well-developed and well-nourished.  Neck: Normal range of motion. Neck supple.  Cardiovascular: Normal rate, regular rhythm and normal heart sounds.   No murmur heard. Pulmonary/Chest: Effort normal and breath sounds normal. No respiratory distress. He has no wheezes. He has no rales.  Neurological: He is alert and oriented to person, place, and time.  Psychiatric: He has a normal mood and affect. His behavior is normal. Judgment and thought content normal.          Assessment & Plan:

## 2011-12-30 NOTE — Assessment & Plan Note (Signed)
Per Dr Balan 

## 2011-12-30 NOTE — Telephone Encounter (Signed)
Dr Talmage Nap is doing dm meds----  glucophage dose is higher than max rec.   Ok to fill other meds for 90 days 3 refills viagra--#10 tab

## 2011-12-30 NOTE — Assessment & Plan Note (Signed)
con't meds Check labs in June

## 2011-12-30 NOTE — Patient Instructions (Signed)

## 2011-12-30 NOTE — Telephone Encounter (Signed)
Addended by: Candie Echevaria L on: 12/30/2011 11:41 AM   Modules accepted: Orders

## 2011-12-30 NOTE — Assessment & Plan Note (Signed)
Pt requesting referral to different urologist

## 2011-12-30 NOTE — Assessment & Plan Note (Signed)
[employer] cozaar to 100 mg  Pt will call and let us know how bp is in 2-3 weeks

## 2011-12-30 NOTE — Telephone Encounter (Signed)
Pt called back to inform Dr Laury Axon that he is using [facility]. Pt also requesting Rx for viagra, last OV 12-30-11.Please advise

## 2011-12-30 NOTE — Telephone Encounter (Signed)
Rx sent 

## 2012-01-10 ENCOUNTER — Other Ambulatory Visit: Payer: Self-pay | Admitting: *Deleted

## 2012-01-10 DIAGNOSIS — E785 Hyperlipidemia, unspecified: Secondary | ICD-10-CM

## 2012-01-10 MED ORDER — GLUCOSE BLOOD VI STRP: ORAL_STRIP | Status: DC

## 2012-01-10 MED ORDER — NIACIN ER (ANTIHYPERLIPIDEMIC) 500 MG PO TBCR: 500.0000 mg | EXTENDED_RELEASE_TABLET | Freq: Every day | ORAL | Status: DC

## 2012-01-10 NOTE — Telephone Encounter (Signed)
Ok to check sugars bid-- fluctuating sugars

## 2012-01-10 NOTE — Telephone Encounter (Signed)
Pt left vm to advise that MD Lowne had sent a RX for Niacin 500mg  [facility], however pt was advised by [facility] that this medication is not under his RX part D, pt noted that MD Lowne was supposed to actually send the Niacin for 250mg  instead of the 500mg , pt wants the medication sent to [facility] on [address] instead, pt also advised that his test strips should be listed for testing 2x daily instead of 1x daily, please advise

## 2012-01-10 NOTE — Telephone Encounter (Signed)
We had niaspan on med list----is he taking otc niacin?

## 2012-01-11 ENCOUNTER — Telehealth: Payer: Self-pay | Admitting: Family Medicine

## 2012-01-11 NOTE — Telephone Encounter (Signed)
Sent to the pharmacy     KP

## 2012-01-11 NOTE — Telephone Encounter (Signed)
250.00

## 2012-01-11 NOTE — Telephone Encounter (Signed)
Pharmacy needs dx code for medicare purposes for:  Glucose blood test strip. Check blood sugars twice a day.

## 2012-01-14 ENCOUNTER — Telehealth: Payer: Self-pay

## 2012-01-14 NOTE — Telephone Encounter (Signed)
Niacin otc is not fda regulated and can be more liver problems--- he can take it but we would need to keep close eye on liver

## 2012-01-14 NOTE — Telephone Encounter (Signed)
Left a detailed message on patients voicemail.

## 2012-01-14 NOTE — Telephone Encounter (Signed)
Patient states he went to pick up his Niacin and it was going to be $90? The pharmacist showed him the OTC Niacin 500 mg and for 120 capsules its only going to be $9? Pt states the only difference is the RX is for extended release? Pt would like to know if its okay to use the OTC? Please advise? Call back # [phone]

## 2012-02-15 ENCOUNTER — Ambulatory Visit: Payer: Medicare Other | Admitting: Adult Health

## 2012-02-23 ENCOUNTER — Ambulatory Visit: Payer: Medicare PPO | Admitting: Pulmonary Disease

## 2012-02-28 ENCOUNTER — Other Ambulatory Visit: Payer: Self-pay | Admitting: Family Medicine

## 2012-02-28 MED ORDER — GLIPIZIDE 5 MG PO TABS: 5.0000 mg | ORAL_TABLET | Freq: Every day | ORAL | Status: DC

## 2012-02-28 NOTE — Telephone Encounter (Signed)
refill glimpizide xl tab 5mg   Requesting 90 day supply 1 PO QD  Last written 7.12.12 Last OV 3.21.13

## 2012-03-01 ENCOUNTER — Encounter: Payer: Self-pay | Admitting: Pulmonary Disease

## 2012-03-01 ENCOUNTER — Ambulatory Visit (INDEPENDENT_AMBULATORY_CARE_PROVIDER_SITE_OTHER): Payer: Medicare Other | Admitting: Pulmonary Disease

## 2012-03-01 VITALS — BP 106/70 | HR 74 | Temp 98.5°F | Ht 71.5 in | Wt 239.0 lb

## 2012-03-01 DIAGNOSIS — G4733 Obstructive sleep apnea (adult) (pediatric): Secondary | ICD-10-CM

## 2012-03-01 NOTE — Patient Instructions (Signed)
Continue with cpap, but keep up with mask changes and supplies Work on weight loss followup with me in one year.  

## 2012-03-01 NOTE — Assessment & Plan Note (Signed)
The patient is doing fairly well with CPAP, and feels that it is continuing to help his sleep and daytime alertness.  I have asked him to try to wear the CPAP most of the night, and to keep up with the mask changes and supplies.  I've also encouraged him to work aggressively on weight loss.

## 2012-03-01 NOTE — Progress Notes (Signed)
  Subjective:    Patient ID: [name], male    DOB: Apr 21, 1941, 71 y.o.   MRN: [id number]  HPI The patient comes in today for followup of his known obstructive sleep apnea.  He is wearing CPAP compliantly, and is having no issues with his mask fit or pressure.  He does wake up fairly frequently around 3 AM, and will usually take the mask off and go back to sleep.  However, he sometimes put back on and sleeps the rest of the night.  I have encouraged him to try to wear CPAP as much as possible.  He feels that he sleeps fairly well, and has no alertness issues during the day.   Review of Systems  Constitutional: Negative.  Negative for fever and unexpected weight change.  HENT: Negative.  Negative for ear pain, nosebleeds, congestion, sore throat, rhinorrhea, sneezing, trouble swallowing, dental problem, postnasal drip and sinus pressure.   Eyes: Negative.  Negative for redness and itching.  Respiratory: Negative.  Negative for cough, chest tightness, shortness of breath and wheezing.   Cardiovascular: Negative.  Negative for palpitations and leg swelling.  Gastrointestinal: Negative.  Negative for nausea and vomiting.  Genitourinary: Negative.  Negative for dysuria.  Musculoskeletal: Negative.  Negative for joint swelling.  Skin: Negative.  Negative for rash.  Neurological: Negative.  Negative for headaches.  Hematological: Negative.  Does not bruise/bleed easily.  Psychiatric/Behavioral: Negative.  Negative for dysphoric mood. The patient is not nervous/anxious.        Objective:   Physical Exam Overweight male in no acute distress No skin breakdown or pressure necrosis from the CPAP mask Lower extremities without edema, no cyanosis Alert and oriented, moves all 4 extremities.  Does not appear to be overly sleepy.       Assessment & Plan:

## 2012-04-07 ENCOUNTER — Telehealth: Payer: Self-pay | Admitting: Family Medicine

## 2012-04-07 MED ORDER — GLUCOSE BLOOD VI STRP: ORAL_STRIP | Status: DC

## 2012-04-07 NOTE — Telephone Encounter (Signed)
Refill: one touch ulta blue test strip. Use one strip twice daily to check glucose. Qty 100. Last fill 04-06-12

## 2012-04-25 ENCOUNTER — Telehealth: Payer: Self-pay | Admitting: Family Medicine

## 2012-04-25 MED ORDER — GLIPIZIDE 5 MG PO TABS: 5.0000 mg | ORAL_TABLET | Freq: Every day | ORAL | Status: DC

## 2012-04-25 NOTE — Telephone Encounter (Signed)
Refill: Glipizide tab 5mg . Take 1 by mouth daily (due for labs). 90 day supply

## 2012-05-10 ENCOUNTER — Other Ambulatory Visit: Payer: Self-pay

## 2012-05-10 MED ORDER — GLIPIZIDE 5 MG PO TABS: 5.0000 mg | ORAL_TABLET | Freq: Every day | ORAL | Status: DC

## 2012-05-10 NOTE — Telephone Encounter (Signed)
Request for Glipizide-- Rx faxed and msg left to make the patient aware [facility] sent [facility].      KP

## 2012-06-20 ENCOUNTER — Telehealth: Payer: Self-pay | Admitting: Family Medicine

## 2012-06-20 MED ORDER — METFORMIN HCL ER 500 MG PO TB24: 1000.0000 mg | ORAL_TABLET | Freq: Two times a day (BID) | ORAL | Status: DC

## 2012-06-20 MED ORDER — GLIPIZIDE 5 MG PO TABS: 5.0000 mg | ORAL_TABLET | Freq: Every day | ORAL | Status: DC

## 2012-06-20 NOTE — Telephone Encounter (Signed)
Letter mailed to repeat labs    KP 

## 2012-06-20 NOTE — Telephone Encounter (Signed)
Pt le mess on triage line regarding medications Do not send to [facility] all meds [facility] to [facility] Notes he is now taking 2 a day of glipizide needs refill 90-day supply Notes also he is now taking 5 a day of metformin needs refill 90-day supply  Cb# home [phone] Cell [phone]  I will assume [facility] means [facility] as there is not [facility] in [facility].

## 2012-07-04 ENCOUNTER — Other Ambulatory Visit (INDEPENDENT_AMBULATORY_CARE_PROVIDER_SITE_OTHER): Payer: Medicare Other

## 2012-07-04 DIAGNOSIS — E119 Type 2 diabetes mellitus without complications: Secondary | ICD-10-CM

## 2012-07-04 DIAGNOSIS — E785 Hyperlipidemia, unspecified: Secondary | ICD-10-CM

## 2012-07-04 LAB — HEPATIC FUNCTION PANEL
ALT: 27 U/L (ref 0–53)
AST: 21 U/L (ref 0–37)
Albumin: 3.9 g/dL (ref 3.5–5.2)

## 2012-07-04 LAB — LIPID PANEL
Cholesterol: 98 mg/dL (ref 0–200)
HDL: 29.8 mg/dL — ABNORMAL LOW (ref >39.00)
LDL Cholesterol: 41 mg/dL (ref 0–99)
Triglycerides: 136 mg/dL (ref 0.0–149.0)

## 2012-07-04 LAB — MICROALBUMIN / CREATININE URINE RATIO
Creatinine,U: 122 mg/dL
Microalb Creat Ratio: 0.2 mg/g (ref 0.0–30.0)

## 2012-07-04 LAB — BASIC METABOLIC PANEL
CO2: 25 mEq/L (ref 19–32)
Calcium: 9.5 mg/dL (ref 8.4–10.5)
GFR: 95.9 mL/min (ref >60.00)
Sodium: 142 mEq/L (ref 135–145)

## 2012-07-04 NOTE — Progress Notes (Signed)
Labs only

## 2012-07-04 NOTE — Progress Notes (Signed)
That is ok ----- Message ----- From: Vance Gather Sent: 07/04/2012 8:33 AM To: Lelon Perla, DO Dr. Laury Axon Your pt [name] , [name] has refused to have his A1c done this morning . Even though I had already released it . He refused because he is seeing Dr. Fredric Mare and she has schedule him to have his A1c done there. Dontre Laduca T.

## 2012-07-06 ENCOUNTER — Encounter: Payer: Self-pay | Admitting: Gastroenterology

## 2012-07-17 ENCOUNTER — Other Ambulatory Visit: Payer: Self-pay

## 2012-07-17 MED ORDER — GLIPIZIDE 5 MG PO TABS: 5.0000 mg | ORAL_TABLET | Freq: Every day | ORAL | Status: DC

## 2012-07-17 NOTE — Telephone Encounter (Signed)
Pt states taking 3 per day need Rx increased. Last OV 03/01/12. Last filled 06/20/12 #90 no refills.  Plz advise    MW

## 2012-08-29 ENCOUNTER — Other Ambulatory Visit: Payer: Self-pay | Admitting: Family Medicine

## 2012-08-29 DIAGNOSIS — N529 Male erectile dysfunction, unspecified: Secondary | ICD-10-CM

## 2012-08-29 MED ORDER — SILDENAFIL CITRATE 100 MG PO TABS: 100.0000 mg | ORAL_TABLET | ORAL | Status: AC | PRN

## 2012-08-29 NOTE — Telephone Encounter (Signed)
pt called need rx for Viagra sent to [facility] on [address], cb# [phone]

## 2012-08-29 NOTE — Telephone Encounter (Signed)
Last seen and filled 12/30/11 #10 with 1 refill. Please advise     KP

## 2012-08-29 NOTE — Telephone Encounter (Signed)
Ok to refill 

## 2012-08-29 NOTE — Telephone Encounter (Signed)
Rx faxed.    KP 

## 2012-10-12 ENCOUNTER — Telehealth: Payer: Self-pay | Admitting: Family Medicine

## 2012-10-12 MED ORDER — GLIPIZIDE 5 MG PO TABS: 5.0000 mg | ORAL_TABLET | Freq: Every day | ORAL | Status: DC

## 2012-10-12 NOTE — Telephone Encounter (Signed)
Refill: Glipizide tab 5 mg. Take 1 po qam and 1 po at night. 90 day supply

## 2012-10-30 ENCOUNTER — Other Ambulatory Visit: Payer: Self-pay | Admitting: *Deleted

## 2012-10-30 MED ORDER — GLIPIZIDE 5 MG PO TABS: 5.0000 mg | ORAL_TABLET | Freq: Every day | ORAL | Status: DC

## 2012-10-30 NOTE — Addendum Note (Signed)
Addended byDuaine Dredge, Delrick Dehart L on: 10/30/2012 09:01 AM   Modules accepted: Orders

## 2012-10-30 NOTE — Telephone Encounter (Signed)
Pt called stating Rx disp # needs to be increased. Rx sent for correct quantity.

## 2012-11-16 ENCOUNTER — Other Ambulatory Visit: Payer: Self-pay | Admitting: Family Medicine

## 2012-11-25 ENCOUNTER — Other Ambulatory Visit: Payer: Self-pay

## 2012-11-29 MED ORDER — GLUCOSE BLOOD VI STRP: ORAL_STRIP | Status: DC

## 2012-11-29 NOTE — Addendum Note (Signed)
Addended by: Candie Echevaria L on: 11/29/2012 12:51 PM   Modules accepted: Orders

## 2012-12-20 ENCOUNTER — Telehealth: Payer: Self-pay | Admitting: Family Medicine

## 2012-12-20 DIAGNOSIS — I10 Essential (primary) hypertension: Secondary | ICD-10-CM

## 2012-12-20 MED ORDER — TRIAMTERENE-HCTZ 37.5-25 MG PO CAPS: 1.0000 | ORAL_CAPSULE | ORAL | Status: DC

## 2012-12-20 MED ORDER — LOSARTAN POTASSIUM 100 MG PO TABS: 100.0000 mg | ORAL_TABLET | Freq: Every day | ORAL | Status: DC

## 2012-12-20 MED ORDER — SIMVASTATIN 40 MG PO TABS: 40.0000 mg | ORAL_TABLET | Freq: Every day | ORAL | Status: DC

## 2012-12-20 NOTE — Telephone Encounter (Signed)
Refill: Triamterene/hctz cap 37.5-25. Take 1 by mouth every morning. 90 day supply Simvastatin tab 40 mg. Take 1 by mouth at bedtime. 90 day supply Losartan pot tab 100 mg. Take 1 by mouth daily. 90 day supply

## 2012-12-28 ENCOUNTER — Ambulatory Visit (INDEPENDENT_AMBULATORY_CARE_PROVIDER_SITE_OTHER): Payer: Medicare Other | Admitting: Family Medicine

## 2012-12-28 DIAGNOSIS — I10 Essential (primary) hypertension: Secondary | ICD-10-CM

## 2012-12-28 DIAGNOSIS — E785 Hyperlipidemia, unspecified: Secondary | ICD-10-CM

## 2012-12-29 ENCOUNTER — Ambulatory Visit: Payer: Medicare Other | Admitting: Family Medicine

## 2012-12-29 DIAGNOSIS — E785 Hyperlipidemia, unspecified: Secondary | ICD-10-CM

## 2012-12-29 LAB — BASIC METABOLIC PANEL
Calcium: 8.8 mg/dL (ref 8.4–10.5)
Creatinine, Ser: 0.8 mg/dL (ref 0.4–1.5)

## 2012-12-29 LAB — CBC WITH DIFFERENTIAL/PLATELET
Basophils Relative: 0 % (ref 0–1)
Eosinophils Absolute: 0.2 10*3/uL (ref 0.0–0.7)
Eosinophils Relative: 2 % (ref 0–5)
Hemoglobin: 15.4 g/dL (ref 13.0–17.0)
MCH: 32 pg (ref 26.0–34.0)
MCHC: 33.9 g/dL (ref 30.0–36.0)
Monocytes Relative: 7 % (ref 3–12)
Neutrophils Relative %: 69 % (ref 43–77)
Platelets: 233 10*3/uL (ref 150–400)

## 2012-12-29 LAB — LIPID PANEL
Cholesterol: 92 mg/dL (ref 0–200)
LDL Cholesterol: 32 mg/dL (ref 0–99)
Triglycerides: 128 mg/dL (ref 0.0–149.0)
VLDL: 25.6 mg/dL (ref 0.0–40.0)

## 2012-12-29 LAB — HEPATIC FUNCTION PANEL: Total Bilirubin: 0.6 mg/dL (ref 0.3–1.2)

## 2012-12-29 LAB — HEMOGLOBIN A1C: Hgb A1c MFr Bld: 5.3 % (ref 4.6–6.5)

## 2013-01-02 ENCOUNTER — Encounter: Payer: Self-pay | Admitting: Family Medicine

## 2013-01-16 ENCOUNTER — Telehealth: Payer: Self-pay | Admitting: Family Medicine

## 2013-01-16 MED ORDER — GLIPIZIDE 5 MG PO TABS: 5.0000 mg | ORAL_TABLET | Freq: Every day | ORAL | Status: DC

## 2013-01-16 NOTE — Telephone Encounter (Signed)
Letter mailed to [facility].    KP

## 2013-01-16 NOTE — Telephone Encounter (Signed)
Refill: Glipizide tab 5 mg. Take 1 by mouth daily. 90 day supply

## 2013-02-14 NOTE — Progress Notes (Signed)
This encounter was used for a lab visit.

## 2013-03-01 ENCOUNTER — Ambulatory Visit: Payer: Medicare Other | Admitting: Pulmonary Disease

## 2013-03-14 ENCOUNTER — Other Ambulatory Visit: Payer: Self-pay | Admitting: General Practice

## 2013-03-14 DIAGNOSIS — I10 Essential (primary) hypertension: Secondary | ICD-10-CM

## 2013-03-14 MED ORDER — TRIAMTERENE-HCTZ 37.5-25 MG PO CAPS: 1.0000 | ORAL_CAPSULE | ORAL | Status: DC

## 2013-03-14 NOTE — Telephone Encounter (Signed)
Med filled.  

## 2013-03-19 ENCOUNTER — Other Ambulatory Visit: Payer: Self-pay | Admitting: General Practice

## 2013-03-19 DIAGNOSIS — I10 Essential (primary) hypertension: Secondary | ICD-10-CM

## 2013-03-19 MED ORDER — SIMVASTATIN 40 MG PO TABS: 40.0000 mg | ORAL_TABLET | Freq: Every day | ORAL | Status: DC

## 2013-03-19 NOTE — Telephone Encounter (Signed)
Med filled.  

## 2013-03-20 ENCOUNTER — Encounter: Payer: Self-pay | Admitting: Pulmonary Disease

## 2013-03-20 ENCOUNTER — Ambulatory Visit (INDEPENDENT_AMBULATORY_CARE_PROVIDER_SITE_OTHER): Payer: Medicare Other | Admitting: Pulmonary Disease

## 2013-03-20 VITALS — BP 118/66 | HR 83 | Temp 98.8°F | Ht 70.0 in | Wt 229.2 lb

## 2013-03-20 DIAGNOSIS — G4733 Obstructive sleep apnea (adult) (pediatric): Secondary | ICD-10-CM

## 2013-03-20 NOTE — Patient Instructions (Addendum)
Will arrange for mask fitting at sleep center.  Can also consider "cloth gasket" from medical equipment company. Continue to work on weight loss followup with me in one year.

## 2013-03-20 NOTE — Progress Notes (Signed)
  Subjective:    Patient ID: [name], male    DOB: 1941-08-22, 72 y.o.   MRN: [id number]  HPI Patient comes in today for followup of his obstructive sleep apnea.  He is wearing CPAP compliantly, and feels that it helps his sleep and daytime alertness.  He is having no issues with his pressure, but has been having issues with his mask.  He is not able to keep it on for the whole night because of itching and facial irritation.  He has not tried different mask.  Of note, the patient's weight has decreased 10 pounds since last visit.   Review of Systems  Constitutional: Negative for fever and unexpected weight change.  HENT: Negative for ear pain, nosebleeds, congestion, sore throat, rhinorrhea, sneezing, trouble swallowing, dental problem, postnasal drip and sinus pressure.   Eyes: Negative for redness and itching.  Respiratory: Negative for cough, chest tightness, shortness of breath and wheezing.   Cardiovascular: Negative for palpitations and leg swelling.  Gastrointestinal: Negative for nausea and vomiting.  Genitourinary: Negative for dysuria.  Musculoskeletal: Negative for joint swelling.  Skin: Negative for rash.  Neurological: Negative for headaches.  Hematological: Does not bruise/bleed easily.  Psychiatric/Behavioral: Negative for dysphoric mood. The patient is not nervous/anxious.        Objective:   Physical Exam Overweight male in no acute distress Nose with purulent discharge noted No skin breakdown or pressure necrosis from the CPAP mask Lower extremities without edema, no cyanosis Alert and oriented, moves all 4 extremities.       Assessment & Plan:

## 2013-03-20 NOTE — Assessment & Plan Note (Signed)
Overall, the patient is doing very well with CPAP and feels that it helps her sleep and daytime alertness.  I think we need to work with him on a different type of mask, and could also consider a cloth gasket.  I will send him to the sleep center for a formal fitting, and have encouraged him to continue working on weight loss.

## 2013-03-22 ENCOUNTER — Encounter: Payer: Self-pay | Admitting: Family Medicine

## 2013-03-22 ENCOUNTER — Ambulatory Visit (INDEPENDENT_AMBULATORY_CARE_PROVIDER_SITE_OTHER): Payer: Medicare Other | Admitting: Family Medicine

## 2013-03-22 VITALS — BP 112/74 | HR 69 | Temp 98.9°F | Ht 71.5 in | Wt 227.2 lb

## 2013-03-22 DIAGNOSIS — Z0000 Encounter for general adult medical examination without abnormal findings: Secondary | ICD-10-CM

## 2013-03-22 DIAGNOSIS — E785 Hyperlipidemia, unspecified: Secondary | ICD-10-CM

## 2013-03-22 DIAGNOSIS — I10 Essential (primary) hypertension: Secondary | ICD-10-CM

## 2013-03-22 DIAGNOSIS — R972 Elevated prostate specific antigen [PSA]: Secondary | ICD-10-CM

## 2013-03-22 DIAGNOSIS — E119 Type 2 diabetes mellitus without complications: Secondary | ICD-10-CM

## 2013-03-22 LAB — BASIC METABOLIC PANEL
BUN: 15 mg/dL (ref 6–23)
Chloride: 100 mEq/L (ref 96–112)
Creatinine, Ser: 0.9 mg/dL (ref 0.4–1.5)
Glucose, Bld: 158 mg/dL — ABNORMAL HIGH (ref 70–99)

## 2013-03-22 LAB — HEPATIC FUNCTION PANEL
ALT: 19 U/L (ref 0–53)
AST: 18 U/L (ref 0–37)
Total Protein: 6.9 g/dL (ref 6.0–8.3)

## 2013-03-22 LAB — CBC WITH DIFFERENTIAL/PLATELET
Basophils Absolute: 0 10*3/uL (ref 0.0–0.1)
Eosinophils Absolute: 0.2 10*3/uL (ref 0.0–0.7)
Eosinophils Relative: 1.7 % (ref 0.0–5.0)
Lymphs Abs: 2.1 10*3/uL (ref 0.7–4.0)
MCHC: 33.8 g/dL (ref 30.0–36.0)
MCV: 97.7 fl (ref 78.0–100.0)
Monocytes Absolute: 0.7 10*3/uL (ref 0.1–1.0)
Neutrophils Relative %: 73.1 % (ref 43.0–77.0)
Platelets: 218 10*3/uL (ref 150.0–400.0)
RDW: 13.8 % (ref 11.5–14.6)
WBC: 11.3 10*3/uL — ABNORMAL HIGH (ref 4.5–10.5)

## 2013-03-22 LAB — LIPID PANEL
Cholesterol: 105 mg/dL (ref 0–200)
LDL Cholesterol: 47 mg/dL (ref 0–99)
Total CHOL/HDL Ratio: 2
Triglycerides: 72 mg/dL (ref 0.0–149.0)

## 2013-03-22 LAB — MICROALBUMIN / CREATININE URINE RATIO: Microalb, Ur: 0.5 mg/dL (ref 0.0–1.9)

## 2013-03-22 NOTE — Assessment & Plan Note (Signed)
Per u rology 

## 2013-03-22 NOTE — Assessment & Plan Note (Signed)
Stable Con' meds 

## 2013-03-22 NOTE — Patient Instructions (Addendum)
Preventive Care for Adults, Male  A healthy lifestyle and preventive care can promote health and wellness. Preventive health guidelines for men include the following key practices:  · A routine yearly physical is a good way to check with your caregiver about your health and preventative screening. It is a chance to share any concerns and updates on your health, and to receive a thorough exam.  · Visit your dentist for a routine exam and preventative care every 6 months. Brush your teeth twice a day and floss once a day. Good oral hygiene prevents tooth decay and gum disease.  · The frequency of eye exams is based on your age, health, family medical history, use of contact lenses, and other factors. Follow your caregiver's recommendations for frequency of eye exams.  · Eat a healthy diet. Foods like vegetables, fruits, whole grains, low-fat dairy products, and lean protein foods contain the nutrients you need without too many calories. Decrease your intake of foods high in solid fats, added sugars, and salt. Eat the right amount of calories for you. Get information about a proper diet from your caregiver, if necessary.  · Regular physical exercise is one of the most important things you can do for your health. Most adults should get at least 150 minutes of moderate-intensity exercise (any activity that increases your heart rate and causes you to sweat) each week. In addition, most adults need muscle-strengthening exercises on 2 or more days a week.  · Maintain a healthy weight. The body mass index (BMI) is a screening tool to identify possible weight problems. It provides an estimate of body fat based on height and weight. Your caregiver can help determine your BMI, and can help you achieve or maintain a healthy weight. For adults 20 years and older:  · A BMI below 18.5 is considered underweight.  · A BMI of 18.5 to 24.9 is normal.  · A BMI of 25 to 29.9 is considered overweight.  · A BMI of 30 and above is  considered obese.  · Maintain normal blood lipids and cholesterol levels by exercising and minimizing your intake of saturated fat. Eat a balanced diet with plenty of fruit and vegetables. Blood tests for lipids and cholesterol should begin at age 20 and be repeated every 5 years. If your lipid or cholesterol levels are high, you are over 50, or you are a high risk for heart disease, you may need your cholesterol levels checked more frequently. Ongoing high lipid and cholesterol levels should be treated with medicines if diet and exercise are not effective.  · If you smoke, find out from your caregiver how to quit. If you do not use tobacco, do not start.  · If you choose to drink alcohol, do not exceed 2 drinks per day. One drink is considered to be 12 ounces (355 mL) of beer, 5 ounces (148 mL) of wine, or 1.5 ounces (44 mL) of liquor.  · Avoid use of street drugs. Do not share needles with anyone. Ask for help if you need support or instructions about stopping the use of drugs.  · High blood pressure causes heart disease and increases the risk of stroke. Your blood pressure should be checked at least every 1 to 2 years. Ongoing high blood pressure should be treated with medicines, if weight loss and exercise are not effective.  · If you are 45 to 71 years old, ask your caregiver if you should take aspirin to prevent heart disease.  · Diabetes screening involves taking   a blood sample to check your fasting blood sugar level. This should be done once every 3 years, after age 45, if you are within normal weight and without risk factors for diabetes. Testing should be considered at a younger age or be carried out more frequently if you are overweight and have at least 1 risk factor for diabetes.  · Colorectal cancer can be detected and often prevented. Most routine colorectal cancer screening begins at the age of 50 and continues through age 75. However, your caregiver may recommend screening at an earlier age if you  have risk factors for colon cancer. On a yearly basis, your caregiver may provide home test kits to check for hidden blood in the stool. Use of a small camera at the end of a tube, to directly examine the colon (sigmoidoscopy or colonoscopy), can detect the earliest forms of colorectal cancer. Talk to your caregiver about this at age 50, when routine screening begins.  Direct examination of the colon should be repeated every 5 to 10 years through age 75, unless early forms of pre-cancerous polyps or small growths are found.  · Hepatitis C blood testing is recommended for all people born from 1945 through 1965 and any individual with known risks for hepatitis C.  · Practice safe sex. Use condoms and avoid high-risk sexual practices to reduce the spread of sexually transmitted infections (STIs). STIs include gonorrhea, chlamydia, syphilis, trichomonas, herpes, HPV, and human immunodeficiency virus (HIV). Herpes, HIV, and HPV are viral illnesses that have no cure. They can result in disability, cancer, and death.  · A one-time screening for abdominal aortic aneurysm (AAA) and surgical repair of large AAAs by sound wave imaging (ultrasonography) is recommended for ages 65 to 75 years who are current or former smokers.  · Healthy men should no longer receive prostate-specific antigen (PSA) blood tests as part of routine cancer screening. Consult with your caregiver about prostate cancer screening.  · Testicular cancer screening is not recommended for adult males who have no symptoms. Screening includes self-exam, caregiver exam, and other screening tests. Consult with your caregiver about any symptoms you have or any concerns you have about testicular cancer.  · Use sunscreen with skin protection factor (SPF) of 30 or more. Apply sunscreen liberally and repeatedly throughout the day. You should seek shade when your shadow is shorter than you. Protect yourself by wearing long sleeves, pants, a wide-brimmed hat, and  sunglasses year round, whenever you are outdoors.  · Once a month, do a whole body skin exam, using a mirror to look at the skin on your back. Notify your caregiver of new moles, moles that have irregular borders, moles that are larger than a pencil eraser, or moles that have changed in shape or color.  · Stay current with required immunizations.  · Influenza. You need a dose every fall (or winter). The composition of the flu vaccine changes each year, so being vaccinated once is not enough.  · Pneumococcal polysaccharide. You need 1 to 2 doses if you smoke cigarettes or if you have certain chronic medical conditions. You need 1 dose at age 65 (or older) if you have never been vaccinated.  · Tetanus, diphtheria, pertussis (Tdap, Td). Get 1 dose of Tdap vaccine if you are younger than age 65 years, are over 65 and have contact with an infant, are a healthcare worker, or simply want to be protected from whooping cough. After that, you need a Td booster dose every 10 years. Consult your   caregiver if you have not had at least 3 tetanus and diphtheria-containing shots sometime in your life or have a deep or dirty wound.  · HPV. This vaccine is recommended for males 13 through 71 years of age. This vaccine may be given to men 22 through 71 years of age who have not completed the 3 dose series. It is recommended for men through age 26 who have sex with men or whose immune system is weakened because of HIV infection, other illness, or medications. The vaccine is given in 3 doses over 6 months.  · Measles, mumps, rubella (MMR). You need at least 1 dose of MMR if you were born in 1957 or later. You may also need a 2nd dose.  · Meningococcal. If you are age 19 to 21 years and a first-year college student living in a residence hall, or have one of several medical conditions, you need to get vaccinated against meningococcal disease. You may also need additional booster doses.  · Zoster (shingles). If you are age 60 years or  older, you should get this vaccine.  · Varicella (chickenpox). If you have never had chickenpox or you were vaccinated but received only 1 dose, talk to your caregiver to find out if you need this vaccine.  · Hepatitis A. You need this vaccine if you have a specific risk factor for hepatitis A virus infection, or you simply wish to be protected from this disease. The vaccine is usually given as 2 doses, 6 to 18 months apart.  · Hepatitis B. You need this vaccine if you have a specific risk factor for hepatitis B virus infection or you simply wish to be protected from this disease. The vaccine is given in 3 doses, usually over 6 months.  Preventative Service / Frequency  Ages 19 to 39  · Blood pressure check.** / Every 1 to 2 years.  · Lipid and cholesterol check.** / Every 5 years beginning at age 20.  · Hepatitis C blood test.** / For any individual with known risks for hepatitis C.  · Skin self-exam. / Monthly.  · Influenza immunization.** / Every year.  · Pneumococcal polysaccharide immunization.** / 1 to 2 doses if you smoke cigarettes or if you have certain chronic medical conditions.  · Tetanus, diphtheria, pertussis (Tdap,Td) immunization. / A one-time dose of Tdap vaccine. After that, you need a Td booster dose every 10 years.  · HPV immunization. / 3 doses over 6 months, if 26 and younger.  · Measles, mumps, rubella (MMR) immunization. / You need at least 1 dose of MMR if you were born in 1957 or later. You may also need a 2nd dose.  · Meningococcal immunization. / 1 dose if you are age 19 to 21 years and a first-year college student living in a residence hall, or have one of several medical conditions, you need to get vaccinated against meningococcal disease. You may also need additional booster doses.  · Varicella immunization.** / Consult your caregiver.  · Hepatitis A immunization.** / Consult your caregiver. 2 doses, 6 to 18 months apart.  · Hepatitis B immunization.** / Consult your caregiver. 3 doses  usually over 6 months.  Ages 40 to 64  · Blood pressure check.** / Every 1 to 2 years.  · Lipid and cholesterol check.** / Every 5 years beginning at age 20.  · Fecal occult blood test (FOBT) of stool. / Every year beginning at age 50 and continuing until age 75. You may not have   to do this test if you get colonoscopy every 10 years.  · Flexible sigmoidoscopy** or colonoscopy.** / Every 5 years for a flexible sigmoidoscopy or every 10 years for a colonoscopy beginning at age 50 and continuing until age 75.  · Hepatitis C blood test.** / For all people born from 1945 through 1965 and any individual with known risks for hepatitis C.  · Skin self-exam. / Monthly.  · Influenza immunization.** / Every year.  · Pneumococcal polysaccharide immunization.** / 1 to 2 doses if you smoke cigarettes or if you have certain chronic medical conditions.  · Tetanus, diphtheria, pertussis (Tdap/Td) immunization.** / A one-time dose of Tdap vaccine. After that, you need a Td booster dose every 10 years.  · Measles, mumps, rubella (MMR) immunization.  / You need at least 1 dose of MMR if you were born in 1957 or later. You may also need a 2nd dose.  · Varicella immunization.**/ Consult your caregiver.  · Meningococcal immunization.** / Consult your caregiver.  · Hepatitis A immunization.** / Consult your caregiver. 2 doses, 6 to 18 months apart.  · Hepatitis B immunization.** / Consult your caregiver. 3 doses, usually over 6 months.  Ages 65 and over  · Blood pressure check.** / Every 1 to 2 years.  · Lipid and cholesterol check.**/ Every 5 years beginning at age 20.  · Fecal occult blood test (FOBT) of stool. / Every year beginning at age 50 and continuing until age 75. You may not have to do this test if you get colonoscopy every 10 years.  · Flexible sigmoidoscopy** or colonoscopy.** / Every 5 years for a flexible sigmoidoscopy or every 10 years for a colonoscopy beginning at age 50 and continuing until age 75.  · Hepatitis C blood  test.** / For all people born from 1945 through 1965 and any individual with known risks for hepatitis C.  · Abdominal aortic aneurysm (AAA) screening.** / A one-time screening for ages 65 to 75 years who are current or former smokers.  · Skin self-exam. / Monthly.  · Influenza immunization.** / Every year.  · Pneumococcal polysaccharide immunization.** / 1 dose at age 65 (or older) if you have never been vaccinated.  · Tetanus, diphtheria, pertussis (Tdap, Td) immunization. / A one-time dose of Tdap vaccine if you are over 65 and have contact with an infant, are a healthcare worker, or simply want to be protected from whooping cough. After that, you need a Td booster dose every 10 years.  · Varicella immunization. ** / Consult your caregiver.  · Meningococcal immunization.** / Consult your caregiver.  · Hepatitis A immunization. ** / Consult your caregiver. 2 doses, 6 to 18 months apart.  · Hepatitis B immunization.** / Check with your caregiver. 3 doses, usually over 6 months.  **Family history and personal history of risk and conditions may change your caregiver's recommendations.  Document Released: 11/23/2001 Document Revised: 12/20/2011 Document Reviewed: 02/22/2011  [employer]® Patient Information ©2014 [employer].

## 2013-03-22 NOTE — Assessment & Plan Note (Signed)
Check labs Cont' meds 

## 2013-03-22 NOTE — Assessment & Plan Note (Signed)
Per Dr Talmage Nap

## 2013-03-22 NOTE — Progress Notes (Signed)
Subjective:    [name] is a 72 y.o. male who presents for Medicare Annual/Subsequent preventive examination.   Preventive Screening-Counseling & Management  Tobacco History  Smoking status  . Former Smoker -- 0.30 packs/day for 25 years  . Quit date: 10/11/1984  Smokeless tobacco  . Not on file    Problems Prior to Visit 1.   Current Problems (verified) Patient Active Problem List   Diagnosis Date Noted  . OBSTRUCTIVE SLEEP APNEA 05/28/2010  . PSA, INCREASED 11/30/2009  . LOW BACK PAIN SYNDROME 08/02/2008  . NECK SPRAIN AND STRAIN 08/02/2008  . POLYP, COLON 01/18/2007  . DIABETES MELLITUS, TYPE II 01/18/2007  . HYPERLIPIDEMIA 01/18/2007  . HYPERTENSION 01/18/2007  . DIVERTICULOSIS, COLON 01/18/2007    Medications Prior to Visit Current Outpatient Prescriptions on File Prior to Visit  Medication Sig Dispense Refill  . Ascorbic Acid (VITAMIN C) 500 MG tablet Take 500 mg by mouth every other day.        Marland Kitchen aspirin 81 MG tablet Take 81 mg by mouth daily.        Marland Kitchen glipiZIDE (GLUCOTROL) 5 MG tablet Take 1 tablet (5 mg total) by mouth daily.  180 tablet  0  . glucose blood (ONE TOUCH ULTRA TEST) test strip Check Blood sugars twice a day  100 each  1  . losartan (COZAAR) 100 MG tablet Take 1 tablet (100 mg total) by mouth daily.  90 tablet  0  . metFORMIN (GLUCOPHAGE-XR) 500 MG 24 hr tablet Take 2 tablets (1,000 mg total) by mouth 2 (two) times daily.  360 tablet  0  . niacin (NIASPAN) 500 MG CR tablet Take 1 tablet (500 mg total) by mouth at bedtime.  90 tablet  3  . Omega-3 Fatty Acids (FISH OIL) 1200 MG CAPS Take 2 capsules by mouth 2 (two) times daily.        . ONE TOUCH ULTRA TEST test strip USE ONE STRIP TO CHECK BLOOD SUGAR TWICE DAILY  100 each  1  . sildenafil (VIAGRA) 100 MG tablet Take 1 tablet (100 mg total) by mouth as needed for erectile dysfunction.  10 tablet  1  . simvastatin (ZOCOR) 40 MG tablet Take 1 tablet (40 mg total) by mouth at bedtime.  90  tablet  0  . triamterene-hydrochlorothiazide (DYAZIDE) 37.5-25 MG per capsule Take 1 each (1 capsule total) by mouth every morning.  90 capsule  1   No current facility-administered medications on file prior to visit.    Current Medications (verified) Current Outpatient Prescriptions  Medication Sig Dispense Refill  . Ascorbic Acid (VITAMIN C) 500 MG tablet Take 500 mg by mouth every other day.        Marland Kitchen aspirin 81 MG tablet Take 81 mg by mouth daily.        . finasteride (PROSCAR) 5 MG tablet Take 5 mg by mouth daily.      Marland Kitchen glipiZIDE (GLUCOTROL) 5 MG tablet Take 1 tablet (5 mg total) by mouth daily.  180 tablet  0  . glucose blood (ONE TOUCH ULTRA TEST) test strip Check Blood sugars twice a day  100 each  1  . losartan (COZAAR) 100 MG tablet Take 1 tablet (100 mg total) by mouth daily.  90 tablet  0  . metFORMIN (GLUCOPHAGE-XR) 500 MG 24 hr tablet Take 2 tablets (1,000 mg total) by mouth 2 (two) times daily.  360 tablet  0  . niacin (NIASPAN) 500 MG CR tablet Take 1 tablet (  500 mg total) by mouth at bedtime.  90 tablet  3  . Omega-3 Fatty Acids (FISH OIL) 1200 MG CAPS Take 2 capsules by mouth 2 (two) times daily.        . ONE TOUCH ULTRA TEST test strip USE ONE STRIP TO CHECK BLOOD SUGAR TWICE DAILY  100 each  1  . sildenafil (VIAGRA) 100 MG tablet Take 1 tablet (100 mg total) by mouth as needed for erectile dysfunction.  10 tablet  1  . simvastatin (ZOCOR) 40 MG tablet Take 1 tablet (40 mg total) by mouth at bedtime.  90 tablet  0  . triamterene-hydrochlorothiazide (DYAZIDE) 37.5-25 MG per capsule Take 1 each (1 capsule total) by mouth every morning.  90 capsule  1   No current facility-administered medications for this visit.     Allergies (verified) Review of patient's allergies indicates no known allergies.   PAST HISTORY  Family History Family History  Problem Relation Age of Onset  . Cancer Father     pancreatic  . Heart disease Mother   . Cancer Mother   . Cancer Other      cervical    Social History History  Substance Use Topics  . Smoking status: Former Smoker -- 0.30 packs/day for 25 years    Quit date: 10/11/1984  . Smokeless tobacco: Not on file  . Alcohol Use: 7.0 oz/week    14 drink(s) per week    Are there smokers in your home (other than you)?  No  Risk Factors Current exercise habits: Home exercise routine includes walking.  Dietary issues discussed: na   Cardiac risk factors: advanced age (older than 26 for men, 9 for women), diabetes mellitus, dyslipidemia, hypertension, male gender and obesity (BMI >= 30 kg/m2).  Depression Screen (Note: if answer to either of the following is "Yes", a more complete depression screening is indicated)   Q1: Over the past two weeks, have you felt down, depressed or hopeless? No  Q2: Over the past two weeks, have you felt little interest or pleasure in doing things? No  Have you lost interest or pleasure in daily life? No  Do you often feel hopeless? No  Do you cry easily over simple problems? No  Activities of Daily Living In your present state of health, do you have any difficulty performing the following activities?:  Driving? No Managing money?  No Feeding yourself? No Getting from bed to chair? No Climbing a flight of stairs? No Preparing food and eating?: No Bathing or showering? No Getting dressed: No Getting to the toilet? No Using the toilet:No Moving around from place to place: No In the past year have you fallen or had a near fall?:No   Are you sexually active?  Yes  Do you have more than one partner?  No  Hearing Difficulties: No Do you often ask people to speak up or repeat themselves? No Do you experience ringing or noises in your ears? No Do you have difficulty understanding soft or whispered voices? No   Do you feel that you have a problem with memory? No  Do you often misplace items? No  Do you feel safe at home?  No  Cognitive Testing  Alert? Yes  Normal  Appearance?Yes  Oriented to person? Yes  Place? Yes   Time? Yes  Recall of three objects?  Yes  Can perform simple calculations? Yes  Displays appropriate judgment?Yes  Can read the correct time from a watch face?Yes   Advanced Directives  have been discussed with the patient? Yes   List the Names of Other Physician/Practitioners you currently use: 1.  Derm-- [facility] 2  Endo-- [facility] 3  Urology-- [facility] 4  opth ? 5. Dentist?  Indicate any recent Medical Services you may have received from other than [facility] providers in the past year (date may be approximate).  Immunization History  Administered Date(s) Administered  . Influenza Split 09/16/2011, 07/11/2012  . Influenza Whole 08/18/2007, 08/02/2008, 07/18/2009, 06/18/2010  . Pneumococcal Polysaccharide 06/12/2003, 11/27/2009  . Td 09/23/1999, 09/24/2008  . Zoster 08/18/2007    Screening Tests Health Maintenance  Topic Date Due  . Influenza Vaccine  06/11/2013  . Urine Microalbumin  07/04/2013  . Ophthalmology Exam  08/23/2013  . Foot Exam  03/22/2014  . Colonoscopy  06/30/2016  . Tetanus/tdap  09/24/2018  . Pneumococcal Polysaccharide Vaccine Age 14 And Over  Completed  . Zostavax  Completed    All answers were reviewed with the patient and necessary referrals were made:  Loreen Freud, DO   03/22/2013   History reviewed:  He  has a past medical history of Type 2 diabetes mellitus; Hyperlipidemia; Hypertension; Diverticulosis; and Diabetes mellitus. He  does not have any pertinent problems on file. He  has past surgical history that includes Achilles tendon surgery and Tonsillectomy. His family history includes Cancer in his father, mother, and other and Heart disease in his mother. He  reports that he quit smoking about 28 years ago. He does not have any smokeless tobacco history on file. He reports that he drinks about 7.0 ounces of alcohol per week. He reports that he does not use illicit drugs. He has a current  medication list which includes the following prescription(s): vitamin c, aspirin, finasteride, glipizide, glucose blood, losartan, metformin, niacin, fish oil, one touch ultra test, sildenafil, simvastatin, and triamterene-hydrochlorothiazide. Current Outpatient Prescriptions on File Prior to Visit  Medication Sig Dispense Refill  . Ascorbic Acid (VITAMIN C) 500 MG tablet Take 500 mg by mouth every other day.        Marland Kitchen aspirin 81 MG tablet Take 81 mg by mouth daily.        Marland Kitchen glipiZIDE (GLUCOTROL) 5 MG tablet Take 1 tablet (5 mg total) by mouth daily.  180 tablet  0  . glucose blood (ONE TOUCH ULTRA TEST) test strip Check Blood sugars twice a day  100 each  1  . losartan (COZAAR) 100 MG tablet Take 1 tablet (100 mg total) by mouth daily.  90 tablet  0  . metFORMIN (GLUCOPHAGE-XR) 500 MG 24 hr tablet Take 2 tablets (1,000 mg total) by mouth 2 (two) times daily.  360 tablet  0  . niacin (NIASPAN) 500 MG CR tablet Take 1 tablet (500 mg total) by mouth at bedtime.  90 tablet  3  . Omega-3 Fatty Acids (FISH OIL) 1200 MG CAPS Take 2 capsules by mouth 2 (two) times daily.        . ONE TOUCH ULTRA TEST test strip USE ONE STRIP TO CHECK BLOOD SUGAR TWICE DAILY  100 each  1  . sildenafil (VIAGRA) 100 MG tablet Take 1 tablet (100 mg total) by mouth as needed for erectile dysfunction.  10 tablet  1  . simvastatin (ZOCOR) 40 MG tablet Take 1 tablet (40 mg total) by mouth at bedtime.  90 tablet  0  . triamterene-hydrochlorothiazide (DYAZIDE) 37.5-25 MG per capsule Take 1 each (1 capsule total) by mouth every morning.  90 capsule  1  No current facility-administered medications on file prior to visit.   He has No Known Allergies.  Review of Systems Review of Systems  Constitutional: Negative for activity change, appetite change and fatigue.  HENT: Negative for hearing loss, congestion, tinnitus and ear discharge.  dentist q22m Eyes: Negative for visual disturbance (see optho q1y -- vision corrected to 20/20  with glasses).  Respiratory: Negative for cough, chest tightness and shortness of breath.   Cardiovascular: Negative for chest pain, palpitations and leg swelling.  Gastrointestinal: Negative for abdominal pain, diarrhea, constipation and abdominal distention.  Genitourinary: Negative for urgency, frequency, decreased urine volume and difficulty urinating.  Musculoskeletal: Negative for back pain, arthralgias and gait problem.  Skin: Negative for color change, pallor and rash.  Neurological: Negative for dizziness, light-headedness, numbness and headaches.  Hematological: Negative for adenopathy. Does not bruise/bleed easily.  Psychiatric/Behavioral: Negative for suicidal ideas, confusion, sleep disturbance, self-injury, dysphoric mood, decreased concentration and agitation.    \   Objective:     Vision by Snellen chart: opth Blood pressure 112/74, pulse 69, temperature 98.9 F (37.2 C), temperature source Oral, height 5' 11.5" (1.816 m), weight 227 lb 3.2 oz (103.057 kg), SpO2 96.00%. Body mass index is 31.25 kg/(m^2).  BP 112/74  Pulse 69  Temp(Src) 98.9 F (37.2 C) (Oral)  Ht 5' 11.5" (1.816 m)  Wt 227 lb 3.2 oz (103.057 kg)  BMI 31.25 kg/m2  SpO2 96% General appearance: alert, cooperative, appears stated age and no distress Head: Normocephalic, without obvious abnormality, atraumatic Eyes: conjunctivae/corneas clear. PERRL, EOM's intact. Fundi benign. Ears: normal TM's and external ear canals both ears Nose: Nares normal. Septum midline. Mucosa normal. No drainage or sinus tenderness. Throat: lips, mucosa, and tongue normal; teeth and gums normal Neck: no adenopathy, no carotid bruit, no JVD, supple, symmetrical, trachea midline and thyroid not enlarged, symmetric, no tenderness/mass/nodules Back: symmetric, no curvature. ROM normal. No CVA tenderness. Lungs: clear to auscultation bilaterally Chest wall: no tenderness Heart: S1, S2 normal Abdomen: soft, non-tender; bowel  sounds normal; no masses,  no organomegaly Male genitalia: normal, deferred--urology Rectal: deferred--urology Extremities: extremities normal, atraumatic, no cyanosis or edema Pulses: 2+ and symmetric Skin: Skin color, texture, turgor normal. No rashes or lesions Lymph nodes: Cervical, supraclavicular, and axillary nodes normal. Neurologic: Alert and oriented X 3, normal strength and tone. Normal symmetric reflexes. Normal coordination and gait Psych-- no anxiety, no depression    Sensory exam of the foot is normal, tested with the monofilament. Good pulses, no lesions or ulcers, good peripheral pulses.    Assessment:     cpe      Plan:     During the course of the visit the patient was educated and counseled about appropriate screening and preventive services including:    Pneumococcal vaccine   Influenza vaccine  Td vaccine  Prostate cancer screening  Colorectal cancer screening  Diabetes screening  Glaucoma screening  Advanced directives: has an advanced directive - a copy HAS NOT been provided.  Diet review for nutrition referral? Yes ____  Not Indicated ___x_   Patient Instructions (the written plan) was given to the patient.  Medicare Attestation I have personally reviewed: The patient's medical and social history Their use of alcohol, tobacco or illicit drugs Their current medications and supplements The patient's functional ability including ADLs,fall risks, home safety risks, cognitive, and hearing and visual impairment Diet and physical activities Evidence for depression or mood disorders  The patient's weight, height, BMI, and visual acuity have been recorded in  the chart.  I have made referrals, counseling, and provided education to the patient based on review of the above and I have provided the patient with a written personalized care plan for preventive services.     Loreen Freud, DO   03/22/2013

## 2013-03-23 LAB — POCT URINALYSIS DIPSTICK
Leukocytes, UA: NEGATIVE
Protein, UA: NEGATIVE
Urobilinogen, UA: 0.2
pH, UA: 6

## 2013-04-03 ENCOUNTER — Other Ambulatory Visit: Payer: Self-pay | Admitting: *Deleted

## 2013-04-03 DIAGNOSIS — I10 Essential (primary) hypertension: Secondary | ICD-10-CM

## 2013-04-03 MED ORDER — LOSARTAN POTASSIUM 100 MG PO TABS: 100.0000 mg | ORAL_TABLET | Freq: Every day | ORAL | Status: DC

## 2013-04-03 NOTE — Telephone Encounter (Signed)
Refill for losartan sent to [facility] per pt request

## 2013-04-10 ENCOUNTER — Telehealth: Payer: Self-pay | Admitting: *Deleted

## 2013-04-10 NOTE — Telephone Encounter (Signed)
Pt left VM that he would like copy of labs mailed to him. Called Pt back left detail VM that labs were released to mychart so he can view them online as well but I will also mail copy to him. Pt advise to return call to office if he still needs any further assistance.

## 2013-04-12 ENCOUNTER — Telehealth: Payer: Self-pay | Admitting: Family Medicine

## 2013-04-12 MED ORDER — GLUCOSE BLOOD VI STRP: ORAL_STRIP | Status: DC

## 2013-04-12 NOTE — Telephone Encounter (Signed)
Glucose Blood (One touch ultra test) Test Strip. QTY 100; Refills 1; Sig Check Blood Sugars twice a day. DX 250.00. Rx was transferred to another pharmacy and cannot be transfered back because of Dx code. Need new Rx with new code, sig date and signature.

## 2013-04-18 ENCOUNTER — Ambulatory Visit (HOSPITAL_BASED_OUTPATIENT_CLINIC_OR_DEPARTMENT_OTHER): Payer: Medicare Other | Attending: Pulmonary Disease | Admitting: Radiology

## 2013-04-18 DIAGNOSIS — G4733 Obstructive sleep apnea (adult) (pediatric): Secondary | ICD-10-CM

## 2013-04-18 DIAGNOSIS — Z9989 Dependence on other enabling machines and devices: Secondary | ICD-10-CM

## 2013-06-07 ENCOUNTER — Other Ambulatory Visit: Payer: Self-pay | Admitting: *Deleted

## 2013-06-07 DIAGNOSIS — I10 Essential (primary) hypertension: Secondary | ICD-10-CM

## 2013-06-07 MED ORDER — SIMVASTATIN 40 MG PO TABS: 40.0000 mg | ORAL_TABLET | Freq: Every day | ORAL | Status: DC

## 2013-06-07 NOTE — Telephone Encounter (Signed)
Rx was refilled for Simvastatin 40 mg.  Ag cma

## 2013-06-08 ENCOUNTER — Other Ambulatory Visit: Payer: Self-pay | Admitting: *Deleted

## 2013-06-08 DIAGNOSIS — I10 Essential (primary) hypertension: Secondary | ICD-10-CM

## 2013-06-08 MED ORDER — SIMVASTATIN 40 MG PO TABS: 40.0000 mg | ORAL_TABLET | Freq: Every day | ORAL | Status: DC

## 2013-08-11 LAB — HM DIABETES EYE EXAM

## 2013-08-16 ENCOUNTER — Other Ambulatory Visit: Payer: Self-pay

## 2013-09-14 ENCOUNTER — Other Ambulatory Visit: Payer: Self-pay

## 2013-09-14 DIAGNOSIS — I10 Essential (primary) hypertension: Secondary | ICD-10-CM

## 2013-09-14 MED ORDER — GLIPIZIDE 5 MG PO TABS: ORAL_TABLET | ORAL | Status: DC

## 2013-09-14 MED ORDER — SIMVASTATIN 40 MG PO TABS: 40.0000 mg | ORAL_TABLET | Freq: Every day | ORAL | Status: DC

## 2013-09-24 ENCOUNTER — Other Ambulatory Visit (HOSPITAL_COMMUNITY): Payer: Self-pay | Admitting: Urology

## 2013-09-24 DIAGNOSIS — R972 Elevated prostate specific antigen [PSA]: Secondary | ICD-10-CM

## 2013-09-26 ENCOUNTER — Other Ambulatory Visit: Payer: Self-pay

## 2013-09-26 DIAGNOSIS — I10 Essential (primary) hypertension: Secondary | ICD-10-CM

## 2013-09-26 MED ORDER — TRIAMTERENE-HCTZ 37.5-25 MG PO CAPS: 1.0000 | ORAL_CAPSULE | ORAL | Status: DC

## 2013-10-03 ENCOUNTER — Ambulatory Visit (HOSPITAL_COMMUNITY)
Admission: RE | Admit: 2013-10-03 | Discharge: 2013-10-03 | Disposition: A | Discharge status: Home / Self Care | Payer: Medicare Other | Source: Ambulatory Visit | Attending: Urology | Admitting: Urology

## 2013-10-03 DIAGNOSIS — R972 Elevated prostate specific antigen [PSA]: Secondary | ICD-10-CM

## 2013-10-03 DIAGNOSIS — M899 Disorder of bone, unspecified: Secondary | ICD-10-CM | POA: Insufficient documentation

## 2013-10-03 DIAGNOSIS — N3289 Other specified disorders of bladder: Secondary | ICD-10-CM | POA: Insufficient documentation

## 2013-10-03 DIAGNOSIS — N400 Enlarged prostate without lower urinary tract symptoms: Secondary | ICD-10-CM | POA: Insufficient documentation

## 2013-10-03 MED ORDER — GADOBENATE DIMEGLUMINE 529 MG/ML IV SOLN
20.0000 mL | Freq: Once | INTRAVENOUS | Status: AC | PRN
  Administered 2013-10-03: 20 mL via INTRAVENOUS

## 2013-10-05 LAB — POCT I-STAT, CHEM 8
Calcium, Ion: 1.13 mmol/L (ref 1.13–1.30)
Creatinine, Ser: 0.9 mg/dL (ref 0.50–1.35)
Glucose, Bld: 191 mg/dL — ABNORMAL HIGH (ref 70–99)
HCT: 45 % (ref 39.0–52.0)
Hemoglobin: 15.3 g/dL (ref 13.0–17.0)
Potassium: 3.6 mEq/L (ref 3.5–5.1)
TCO2: 25 mmol/L (ref 0–100)

## 2013-10-24 ENCOUNTER — Other Ambulatory Visit: Payer: Self-pay

## 2013-10-24 DIAGNOSIS — I10 Essential (primary) hypertension: Secondary | ICD-10-CM

## 2013-10-24 MED ORDER — BD SWAB SINGLE USE REGULAR PADS: MEDICATED_PAD | Status: AC

## 2013-10-24 MED ORDER — LOSARTAN POTASSIUM 100 MG PO TABS: 100.0000 mg | ORAL_TABLET | Freq: Every day | ORAL | Status: DC

## 2013-10-24 MED ORDER — FINASTERIDE 5 MG PO TABS: 5.0000 mg | ORAL_TABLET | Freq: Every day | ORAL | Status: DC

## 2013-10-24 MED ORDER — METFORMIN HCL ER 500 MG PO TB24: 1000.0000 mg | ORAL_TABLET | Freq: Two times a day (BID) | ORAL | Status: DC

## 2013-10-24 MED ORDER — GLIPIZIDE 5 MG PO TABS: ORAL_TABLET | ORAL | Status: DC

## 2013-10-24 MED ORDER — SIMVASTATIN 40 MG PO TABS: 40.0000 mg | ORAL_TABLET | Freq: Every day | ORAL | Status: DC

## 2013-10-24 MED ORDER — TRIAMTERENE-HCTZ 37.5-25 MG PO CAPS: 1.0000 | ORAL_CAPSULE | ORAL | Status: DC

## 2013-11-05 ENCOUNTER — Other Ambulatory Visit: Payer: Self-pay | Admitting: Family Medicine

## 2013-11-05 ENCOUNTER — Telehealth: Payer: Self-pay | Admitting: *Deleted

## 2013-11-05 DIAGNOSIS — E1159 Type 2 diabetes mellitus with other circulatory complications: Secondary | ICD-10-CM

## 2013-11-05 DIAGNOSIS — R972 Elevated prostate specific antigen [PSA]: Secondary | ICD-10-CM

## 2013-11-05 DIAGNOSIS — I10 Essential (primary) hypertension: Secondary | ICD-10-CM

## 2013-11-05 DIAGNOSIS — E785 Hyperlipidemia, unspecified: Secondary | ICD-10-CM

## 2013-11-05 NOTE — Telephone Encounter (Signed)
Orders put in

## 2013-11-05 NOTE — Telephone Encounter (Signed)
Patient called and wanted to know if he could come on January 29 th and have blood work done before his visit on February 5 th ar 10:00am

## 2013-11-05 NOTE — Telephone Encounter (Signed)
Please advise      KP 

## 2013-11-08 ENCOUNTER — Ambulatory Visit: Payer: Medicare Other | Admitting: Family Medicine

## 2013-11-15 ENCOUNTER — Encounter: Payer: Self-pay | Admitting: Family Medicine

## 2013-11-15 ENCOUNTER — Ambulatory Visit (INDEPENDENT_AMBULATORY_CARE_PROVIDER_SITE_OTHER): Payer: Medicare HMO | Admitting: Family Medicine

## 2013-11-15 VITALS — BP 113/68 | HR 71 | Temp 97.6°F | Wt 233.0 lb

## 2013-11-15 DIAGNOSIS — E785 Hyperlipidemia, unspecified: Secondary | ICD-10-CM

## 2013-11-15 DIAGNOSIS — R972 Elevated prostate specific antigen [PSA]: Secondary | ICD-10-CM

## 2013-11-15 DIAGNOSIS — E119 Type 2 diabetes mellitus without complications: Secondary | ICD-10-CM

## 2013-11-15 DIAGNOSIS — I10 Essential (primary) hypertension: Secondary | ICD-10-CM

## 2013-11-15 DIAGNOSIS — E1159 Type 2 diabetes mellitus with other circulatory complications: Secondary | ICD-10-CM

## 2013-11-15 LAB — POCT URINALYSIS DIPSTICK
BILIRUBIN UA: NEGATIVE
Blood, UA: NEGATIVE
GLUCOSE UA: NEGATIVE
Ketones, UA: NEGATIVE
Leukocytes, UA: NEGATIVE
Nitrite, UA: NEGATIVE
Protein, UA: NEGATIVE
SPEC GRAV UA: 1.015
Urobilinogen, UA: 0.2
pH, UA: 5

## 2013-11-15 MED ORDER — GLIPIZIDE 5 MG PO TABS: ORAL_TABLET | ORAL | Status: DC

## 2013-11-15 MED ORDER — GLUCOSE BLOOD VI STRP: ORAL_STRIP | Status: DC

## 2013-11-15 MED ORDER — FINASTERIDE 5 MG PO TABS: 5.0000 mg | ORAL_TABLET | Freq: Every day | ORAL | Status: DC

## 2013-11-15 MED ORDER — METFORMIN HCL ER 500 MG PO TB24: 1000.0000 mg | ORAL_TABLET | Freq: Two times a day (BID) | ORAL | Status: DC

## 2013-11-15 NOTE — Progress Notes (Signed)
Pre visit review using our clinic review tool, if applicable. No additional management support is needed unless otherwise documented below in the visit note. 

## 2013-11-15 NOTE — Patient Instructions (Signed)

## 2013-11-15 NOTE — Progress Notes (Signed)
Patient ID: [name], male   DOB: 01-24-1941, 73 y.o.   MRN: [id number]   Subjective:    Patient ID: [name], male    DOB: November 03, 1940, 73 y.o.   MRN: [id number] HPI  HPI HYPERTENSION  Blood pressure range-not checking at home  Chest pain- no      Dyspnea- no Lightheadedness- no   Edema- no Other side effects - no   Medication compliance: good Low salt diet- yes  DIABETES  Blood Sugar ranges-  Polyuria- no New Visual problems- no Hypoglycemic symptoms- no Other side effects-no Medication compliance - good Last eye exam- last year Foot exam- today  HYPERLIPIDEMIA  Medication compliance- good RUQ pain- no  Muscle aches- no Other side effects-no  ROS See HPI above   PMH Smoking Status noted             Objective:    BP 113/68  Pulse 71  Temp(Src) 97.6 F (36.4 C) (Oral)  Wt 233 lb (105.688 kg)  SpO2 98% General appearance: alert, cooperative, appears stated age and no distress Throat: lips, mucosa, and tongue normal; teeth and gums normal Neck: no adenopathy, supple, symmetrical, trachea midline and thyroid not enlarged, symmetric, no tenderness/mass/nodules Lungs: clear to auscultation bilaterally Heart: regular rate and rhythm, S1, S2 normal, no murmur, click, rub or gallop Extremities: extremities normal, atraumatic, no cyanosis or edema        Assessment & Plan:  1. Diabetes mellitus type 2, controlled, without complications Per endo - metFORMIN (GLUCOPHAGE-XR) 500 MG 24 hr tablet; Take 2 tablets (1,000 mg total) by mouth 2 (two) times daily. 2 po in am and 3 po qam; Refill: 0 - glipiZIDE (GLUCOTROL) 5 MG tablet; 1 tab by mouth daily-- repeat labs are due now  Dispense: 90 tablet; Refill: 0 - glucose blood (ONE TOUCH ULTRA TEST) test strip; Check Blood sugars twice a day  Dispense: 100 each; Refill: 1 - glucose blood (ONE TOUCH ULTRA TEST) test strip; USE ONE STRIP TO CHECK BLOOD SUGAR TWICE DAILY. DX 250.00  Dispense:  100 each; Refill: 1  2. HTN (hypertension) con't meds - Basic metabolic panel - CBC with Differential  3. Hyperlipidemia LDL goal < 70 con't meds Check labs  4. Other and unspecified hyperlipidemia Check labs - Hepatic function panel - Lipid panel  5. Type II or unspecified type diabetes mellitus with peripheral circulatory disorders, uncontrolled(250.72) Check labs - POCT urinalysis dipstick  6. Elevated PSA Check labs-- f/u urology - PSA - finasteride (PROSCAR) 5 MG tablet; Take 1 tablet (5 mg total) by mouth daily.  Dispense: 90 tablet; Refill: 0

## 2013-11-16 ENCOUNTER — Other Ambulatory Visit: Payer: Self-pay | Admitting: *Deleted

## 2013-11-16 ENCOUNTER — Telehealth: Payer: Self-pay

## 2013-11-16 ENCOUNTER — Telehealth: Payer: Self-pay | Admitting: Family Medicine

## 2013-11-16 LAB — CBC WITH DIFFERENTIAL/PLATELET
Basophils Absolute: 0 10*3/uL (ref 0.0–0.1)
Basophils Relative: 0.2 % (ref 0.0–3.0)
EOS ABS: 0.1 10*3/uL (ref 0.0–0.7)
Eosinophils Relative: 1.1 % (ref 0.0–5.0)
HCT: 45.6 % (ref 39.0–52.0)
HEMOGLOBIN: 15.5 g/dL (ref 13.0–17.0)
LYMPHS PCT: 18 % (ref 12.0–46.0)
Lymphs Abs: 2.1 10*3/uL (ref 0.7–4.0)
MCHC: 33.9 g/dL (ref 30.0–36.0)
MCV: 98.9 fl (ref 78.0–100.0)
Monocytes Absolute: 0.8 10*3/uL (ref 0.1–1.0)
Monocytes Relative: 7.2 % (ref 3.0–12.0)
Neutro Abs: 8.5 10*3/uL — ABNORMAL HIGH (ref 1.4–7.7)
Neutrophils Relative %: 73.5 % (ref 43.0–77.0)
Platelets: 193 10*3/uL (ref 150.0–400.0)
RBC: 4.61 Mil/uL (ref 4.22–5.81)
RDW: 13.2 % (ref 11.5–14.6)
WBC: 11.6 10*3/uL — ABNORMAL HIGH (ref 4.5–10.5)

## 2013-11-16 LAB — PSA: PSA: 2.77 ng/mL (ref 0.10–4.00)

## 2013-11-16 MED ORDER — AUTO-LANCET MISC: Status: AC

## 2013-11-16 MED ORDER — ACCU-CHEK NANO SMARTVIEW W/DEVICE KIT: PACK | Status: AC

## 2013-11-16 MED ORDER — GLUCOSE BLOOD VI STRP: ORAL_STRIP | Status: DC

## 2013-11-16 NOTE — Telephone Encounter (Signed)
Relevant patient education assigned to patient using Emmi. ° °

## 2013-11-19 LAB — LIPID PANEL
Cholesterol: 105 mg/dL (ref 0–200)
HDL: 30.3 mg/dL — AB (ref >39.00)
Total CHOL/HDL Ratio: 3
Triglycerides: 218 mg/dL — ABNORMAL HIGH (ref 0.0–149.0)
VLDL: 43.6 mg/dL — ABNORMAL HIGH (ref 0.0–40.0)

## 2013-11-19 LAB — BASIC METABOLIC PANEL
BUN: 26 mg/dL — ABNORMAL HIGH (ref 6–23)
CALCIUM: 9.3 mg/dL (ref 8.4–10.5)
CHLORIDE: 104 meq/L (ref 96–112)
CO2: 25 mEq/L (ref 19–32)
Creatinine, Ser: 1.1 mg/dL (ref 0.4–1.5)
GFR: 69.98 mL/min (ref >60.00)
Glucose, Bld: 158 mg/dL — ABNORMAL HIGH (ref 70–99)
Potassium: 3.8 mEq/L (ref 3.5–5.1)
SODIUM: 139 meq/L (ref 135–145)

## 2013-11-19 LAB — LDL CHOLESTEROL, DIRECT: LDL DIRECT: 46.7 mg/dL

## 2013-11-19 LAB — HEPATIC FUNCTION PANEL
ALBUMIN: 4.4 g/dL (ref 3.5–5.2)
ALT: 28 U/L (ref 0–53)
AST: 25 U/L (ref 0–37)
Alkaline Phosphatase: 51 U/L (ref 39–117)
Bilirubin, Direct: 0.2 mg/dL (ref 0.0–0.3)
Total Bilirubin: 1.7 mg/dL — ABNORMAL HIGH (ref 0.3–1.2)
Total Protein: 6.9 g/dL (ref 6.0–8.3)

## 2013-11-20 MED ORDER — FENOFIBRATE 160 MG PO TABS: 160.0000 mg | ORAL_TABLET | Freq: Every day | ORAL | Status: DC

## 2013-11-20 MED ORDER — SIMVASTATIN 20 MG PO TABS: 20.0000 mg | ORAL_TABLET | Freq: Every day | ORAL | Status: DC

## 2013-11-29 ENCOUNTER — Telehealth: Payer: Self-pay | Admitting: General Practice

## 2013-11-29 NOTE — Telephone Encounter (Signed)
Spoke with [name] today, he stated that Dr. Etter Sjogren is aware of his foot fungus due to her checking his feet at diabetic exams. [name] saw a commercial on tv for Jublia. Would like to know your opinion on wether or not this would help him. If so please call in Rx to local pharmacy.    Last OV 11/15/13

## 2013-11-29 NOTE — Telephone Encounter (Signed)
i don't have a lot of experienece with it but because it is new , Im guessing its expensive----- jublia 1 bottle 5 refills Apply qd x 48 weeks

## 2013-11-30 MED ORDER — EFINACONAZOLE 10 % EX SOLN: CUTANEOUS | Status: DC

## 2013-11-30 NOTE — Telephone Encounter (Signed)
Msg left advising Rx faxed.      KP 

## 2013-12-18 ENCOUNTER — Other Ambulatory Visit: Payer: Self-pay | Admitting: *Deleted

## 2013-12-18 ENCOUNTER — Telehealth: Payer: Self-pay | Admitting: *Deleted

## 2013-12-18 MED ORDER — EFINACONAZOLE 10 % EX SOLN: CUTANEOUS | Status: AC

## 2013-12-18 NOTE — Telephone Encounter (Signed)
Patient called requesting clarification for Efinaconazole 10 % SOLN to read for 28 days for insurance purposes. He is currently told he has to pay $500 for this medication and the medication will be covered if the prescription is clarified. Prescription was changed to say 28 days and e-scribed to pharmacy. JG//CMA

## 2013-12-25 ENCOUNTER — Other Ambulatory Visit: Payer: Self-pay | Admitting: Family Medicine

## 2014-01-29 ENCOUNTER — Telehealth: Payer: Self-pay | Admitting: Pulmonary Disease

## 2014-01-29 ENCOUNTER — Encounter: Payer: Self-pay | Admitting: Pulmonary Disease

## 2014-01-29 NOTE — Telephone Encounter (Signed)
Noted  

## 2014-01-29 NOTE — Telephone Encounter (Signed)
I called spoke with pt. He reports he has tried using CPAP off and on since last appt in June 2014.  He has tried several masks and still not able to use CPAP. He reports the CPAP is just to difficult to deal with.  Will let [place] know

## 2014-02-20 ENCOUNTER — Telehealth: Payer: Self-pay | Admitting: Family Medicine

## 2014-02-20 ENCOUNTER — Other Ambulatory Visit: Payer: Self-pay | Admitting: Family Medicine

## 2014-02-20 DIAGNOSIS — I10 Essential (primary) hypertension: Secondary | ICD-10-CM

## 2014-02-20 DIAGNOSIS — E785 Hyperlipidemia, unspecified: Secondary | ICD-10-CM

## 2014-02-20 NOTE — Telephone Encounter (Signed)
Pt needs fasting labs. Ok to schedule, I placed the orders.

## 2014-02-20 NOTE — Telephone Encounter (Signed)
Med filled.  

## 2014-02-20 NOTE — Telephone Encounter (Signed)
Caller name: Josph Macho  Call back number:[phone]   Reason for call:  Pt stated he was supposed to come back and have labs done again 3 months from his previous visit.  Did not see any orders.  If this is needed, can we get those placed?

## 2014-02-22 ENCOUNTER — Telehealth: Payer: Self-pay | Admitting: Family Medicine

## 2014-02-22 NOTE — Telephone Encounter (Signed)
Pt would like to have A1C added to the lab orders that were placed.  Pt has lab apt on 02/27/14

## 2014-02-25 ENCOUNTER — Other Ambulatory Visit: Payer: Self-pay | Admitting: Family Medicine

## 2014-02-25 DIAGNOSIS — E119 Type 2 diabetes mellitus without complications: Secondary | ICD-10-CM

## 2014-02-25 NOTE — Telephone Encounter (Signed)
Please advise      KP 

## 2014-02-25 NOTE — Telephone Encounter (Signed)
Order added.

## 2014-02-26 ENCOUNTER — Other Ambulatory Visit: Payer: Self-pay

## 2014-02-26 MED ORDER — FENOFIBRATE 160 MG PO TABS: ORAL_TABLET | ORAL | Status: DC

## 2014-02-27 ENCOUNTER — Other Ambulatory Visit (INDEPENDENT_AMBULATORY_CARE_PROVIDER_SITE_OTHER): Payer: Medicare HMO

## 2014-02-27 DIAGNOSIS — E785 Hyperlipidemia, unspecified: Secondary | ICD-10-CM

## 2014-02-27 DIAGNOSIS — I10 Essential (primary) hypertension: Secondary | ICD-10-CM

## 2014-02-27 DIAGNOSIS — E119 Type 2 diabetes mellitus without complications: Secondary | ICD-10-CM

## 2014-02-27 LAB — BASIC METABOLIC PANEL
BUN: 15 mg/dL (ref 6–23)
CALCIUM: 8.7 mg/dL (ref 8.4–10.5)
CO2: 23 mEq/L (ref 19–32)
CREATININE: 1 mg/dL (ref 0.4–1.5)
Chloride: 102 mEq/L (ref 96–112)
GFR: 78.97 mL/min (ref >60.00)
Glucose, Bld: 143 mg/dL — ABNORMAL HIGH (ref 70–99)
Potassium: 3.6 mEq/L (ref 3.5–5.1)
Sodium: 136 mEq/L (ref 135–145)

## 2014-02-27 LAB — LIPID PANEL
CHOL/HDL RATIO: 3
Cholesterol: 121 mg/dL (ref 0–200)
HDL: 36.7 mg/dL — AB (ref >39.00)
LDL Cholesterol: 62 mg/dL (ref 0–99)
TRIGLYCERIDES: 110 mg/dL (ref 0.0–149.0)
VLDL: 22 mg/dL (ref 0.0–40.0)

## 2014-02-27 LAB — HEPATIC FUNCTION PANEL
ALK PHOS: 36 U/L — AB (ref 39–117)
ALT: 22 U/L (ref 0–53)
AST: 27 U/L (ref 0–37)
Albumin: 3.9 g/dL (ref 3.5–5.2)
BILIRUBIN DIRECT: 0.2 mg/dL (ref 0.0–0.3)
BILIRUBIN TOTAL: 1 mg/dL (ref 0.2–1.2)
Total Protein: 6.4 g/dL (ref 6.0–8.3)

## 2014-02-27 LAB — HEMOGLOBIN A1C: Hgb A1c MFr Bld: 7 % — ABNORMAL HIGH (ref 4.6–6.5)

## 2014-03-13 ENCOUNTER — Other Ambulatory Visit: Payer: Self-pay

## 2014-03-13 NOTE — Telephone Encounter (Signed)
Accu-Check Smartview test strips.     KP

## 2014-03-20 ENCOUNTER — Ambulatory Visit: Payer: Medicare Other | Admitting: Pulmonary Disease

## 2014-04-15 ENCOUNTER — Telehealth: Payer: Self-pay | Admitting: Family Medicine

## 2014-04-15 MED ORDER — GLUCOSE BLOOD VI STRP: ORAL_STRIP | Status: DC

## 2014-04-15 NOTE — Telephone Encounter (Signed)
02/27/14 labs never resulted. Please advise     KP

## 2014-04-15 NOTE — Telephone Encounter (Signed)
Caller name: Josph Macho  Call back number:[phone] Pharmacy:  [facility]   Reason for call:  Pt states that the mail order pharmacy has never got the refill approval for RX Accu-Check Smartview test strips..    Pt also would like his lab results from 02/2014 to be mailed out.

## 2014-04-16 NOTE — Telephone Encounter (Signed)
Labs from 02-2014: All lab are within normal except for his blood sugar, A1c has increased and is now 7.0. In addition to eat healthier and stay active, I like him to come back and see Dr. Etter Sjogren for consideration of   medication. Please arrange an appointment, no urgent

## 2014-04-17 ENCOUNTER — Encounter: Payer: Self-pay | Admitting: *Deleted

## 2014-04-17 NOTE — Telephone Encounter (Signed)
Letter sent to the patient with lab results from May. Patient advised to make follow up appt with Dr. Etter Sjogren to discuss treatment options for diabetes

## 2014-05-02 ENCOUNTER — Other Ambulatory Visit: Payer: Self-pay | Admitting: Family Medicine

## 2014-05-16 ENCOUNTER — Ambulatory Visit: Payer: Medicare HMO | Admitting: Family Medicine

## 2014-06-11 ENCOUNTER — Ambulatory Visit: Payer: Medicare HMO | Attending: Internal Medicine | Admitting: Physical Therapy

## 2014-06-11 DIAGNOSIS — M545 Low back pain, unspecified: Secondary | ICD-10-CM | POA: Diagnosis not present

## 2014-06-11 DIAGNOSIS — M6281 Muscle weakness (generalized): Secondary | ICD-10-CM | POA: Diagnosis not present

## 2014-06-11 DIAGNOSIS — IMO0001 Reserved for inherently not codable concepts without codable children: Secondary | ICD-10-CM | POA: Insufficient documentation

## 2014-06-11 DIAGNOSIS — Z9181 History of falling: Secondary | ICD-10-CM | POA: Diagnosis not present

## 2014-06-11 DIAGNOSIS — R262 Difficulty in walking, not elsewhere classified: Secondary | ICD-10-CM | POA: Diagnosis not present

## 2014-06-18 ENCOUNTER — Ambulatory Visit: Payer: Medicare HMO | Admitting: Physical Therapy

## 2014-06-18 DIAGNOSIS — IMO0001 Reserved for inherently not codable concepts without codable children: Secondary | ICD-10-CM | POA: Diagnosis not present

## 2014-07-04 ENCOUNTER — Ambulatory Visit: Payer: Medicare HMO | Admitting: Physical Therapy

## 2014-07-04 DIAGNOSIS — IMO0001 Reserved for inherently not codable concepts without codable children: Secondary | ICD-10-CM | POA: Diagnosis not present

## 2014-09-17 ENCOUNTER — Other Ambulatory Visit: Payer: Self-pay

## 2014-09-17 MED ORDER — LOSARTAN POTASSIUM 100 MG PO TABS: ORAL_TABLET | ORAL | Status: DC

## 2014-09-17 MED ORDER — TRIAMTERENE-HCTZ 37.5-25 MG PO CAPS: ORAL_CAPSULE | ORAL | Status: DC

## 2014-09-17 MED ORDER — FINASTERIDE 5 MG PO TABS: 5.0000 mg | ORAL_TABLET | Freq: Every day | ORAL | Status: AC

## 2014-09-23 ENCOUNTER — Other Ambulatory Visit: Payer: Self-pay | Admitting: Family Medicine

## 2014-11-18 ENCOUNTER — Other Ambulatory Visit: Payer: Self-pay | Admitting: Family Medicine

## 2014-11-19 NOTE — Telephone Encounter (Signed)
Refills have been requested but the patient is due for an OV. Please offer him an apt.      KP

## 2014-11-27 NOTE — Telephone Encounter (Signed)
Pt stated he has found another PCP physician so he will decline scheduling an appointment

## 2014-11-28 NOTE — Telephone Encounter (Signed)
Med's declined.      KP

## 2014-12-04 ENCOUNTER — Other Ambulatory Visit: Payer: Self-pay | Admitting: Family Medicine

## 2014-12-24 ENCOUNTER — Other Ambulatory Visit: Payer: Self-pay | Admitting: Family Medicine

## 2015-01-24 ENCOUNTER — Other Ambulatory Visit: Payer: Self-pay | Admitting: Family Medicine

## 2015-02-22 ENCOUNTER — Other Ambulatory Visit: Payer: Self-pay | Admitting: Family Medicine

## 2015-03-03 ENCOUNTER — Other Ambulatory Visit: Payer: Self-pay | Admitting: Family Medicine

## 2015-03-25 ENCOUNTER — Other Ambulatory Visit: Payer: Self-pay | Admitting: Family Medicine

## 2015-03-27 ENCOUNTER — Other Ambulatory Visit: Payer: Self-pay | Admitting: Family Medicine

## 2015-04-07 ENCOUNTER — Other Ambulatory Visit: Payer: Self-pay

## 2015-04-15 ENCOUNTER — Other Ambulatory Visit: Payer: Self-pay | Admitting: Family Medicine

## 2015-06-22 ENCOUNTER — Other Ambulatory Visit: Payer: Self-pay | Admitting: Family Medicine

## 2015-10-13 ENCOUNTER — Other Ambulatory Visit: Payer: Self-pay | Admitting: Family Medicine

## 2017-02-15 ENCOUNTER — Encounter: Payer: Self-pay | Admitting: Physical Therapy

## 2017-02-15 ENCOUNTER — Ambulatory Visit: Payer: Medicare HMO | Attending: Psychiatry | Admitting: Physical Therapy

## 2017-02-15 DIAGNOSIS — R296 Repeated falls: Secondary | ICD-10-CM | POA: Insufficient documentation

## 2017-02-15 DIAGNOSIS — M6281 Muscle weakness (generalized): Secondary | ICD-10-CM | POA: Insufficient documentation

## 2017-02-15 NOTE — Therapy (Signed)
[address] [place], [place], [address] Phone: [phone]   Fax:  [phone]  Physical Therapy Evaluation  Patient Details  Name: [name] MRN: [id number] Date of Birth: [birth year] Referring Provider: Chales Salmon  Encounter Date: 02/15/2017      PT End of Session - 02/15/17 1157    Visit Number 1   Date for PT Re-Evaluation 04/17/17   PT Start Time 1100   PT Stop Time 1150   PT Time Calculation (min) 50 min   Activity Tolerance Patient tolerated treatment well   Behavior During Therapy [facility] for tasks assessed/performed      Past Medical History:  Diagnosis Date  . Diabetes mellitus   . Diverticulosis   . Hyperlipidemia   . Hypertension   . Type 2 diabetes mellitus ([place])     Past Surgical History:  Procedure Laterality Date  . ACHILLES TENDON SURGERY     L  . TONSILLECTOMY      There were no vitals filed for this visit.       Subjective Assessment - 02/15/17 1059    Subjective Patient is familar to me as he was seen here in 2014 due to falls, he reports that he did great at that time, reports that he had a fall going up stairs in September where he "cracked" ribs and vertebrae, reports that he has bee much less active since that time, he reports 2 other falls since that time.  He reports that he has had more fear since the fall in September, he started using a SPC over the past month and reports increased fear   Limitations Walking   Patient Stated Goals no falls, walk better   Currently in Pain? Yes   Pain Score 0-No pain   Pain Location Leg   Pain Orientation Right;Left   Pain Descriptors / Indicators Discomfort   Pain Type Chronic pain   Pain Onset More than a month ago   Pain Frequency Intermittent   Aggravating Factors  standing, getting up from sitting, going up/dwn stairs will increase pain in the legs 4/10 pain level   Pain Relieving Factors rest pain 0/10   Effect of Pain on Daily Activities difficulty walking, getting up and down from sitting            [facility] PT Assessment - 02/15/17 0001      Assessment   Medical Diagnosis falls, gait unsteadiness   Referring Provider Chales Salmon   Onset Date/Surgical Date 02/08/17   Prior Therapy 4 years ago     Precautions   Precautions None     Balance Screen   Has the patient fallen in the past 6 months Yes   How many times? 3   Has the patient had a decrease in activity level because of a fear of falling?  Yes   Is the patient reluctant to leave their home because of a fear of falling?  No     Home Environment   Additional Comments patient reports that a lot of stairs at home and goes up and down a lot, reports two falls have occurred on the stairs, does yardwork and some housework     Prior Function   Level of Independence Independent   Vocation Retired   Leisure does not exercise     ROM / Strength   AROM / PROM / Strength AROM;Strength     AROM   Overall AROM Comments AROM  of the LE's are WFL's     Strength   Overall Strength Comments hips 3+/5, knees 4-/5, ankles 4-/5     Transfers   Comments uses hads to push up, c/o leg weakness     Ambulation/Gait   Gait Comments uses a SPC, wide BOS, slow when turning as he reports feeling unsteady     Standardized Balance Assessment   Standardized Balance Assessment Timed Up and Go Test;Berg Balance Test     Berg Balance Test   Sit to Stand Able to stand  independently using hands   Standing Unsupported Able to stand safely 2 minutes   Sitting with Back Unsupported but Feet Supported on Floor or Stool Able to sit safely and securely 2 minutes   Stand to Sit Controls descent by using hands   Transfers Able to transfer safely, definite need of hands   Standing Unsupported with Eyes Closed Able to stand 10 seconds safely   Standing Ubsupported with Feet Together Able to place feet together independently and stand for 1 minute with  supervision   From Standing, Reach Forward with Outstretched Arm Can reach confidently >25 cm (10")   From Standing Position, Pick up Object from Floor Able to pick up shoe safely and easily   From Standing Position, Turn to Look Behind Over each Shoulder Turn sideways only but maintains balance   Turn 360 Degrees Able to turn 360 degrees safely in 4 seconds or less   Standing Unsupported, Alternately Place Feet on Step/Stool Able to complete 4 steps without aid or supervision   Standing Unsupported, One Foot in Front Able to take small step independently and hold 30 seconds   Standing on One Leg Tries to lift leg/unable to hold 3 seconds but remains standing independently   Total Score 43     Timed Up and Go Test   Normal TUG (seconds) 19                   [facility] Adult PT Treatment/Exercise - 02/15/17 0001      Exercises   Exercises Knee/Hip     Knee/Hip Exercises: Aerobic   Nustep level 4 x 6 minutes                PT Education - 02/15/17 1155    Education provided Yes   Education Details HEP for balance, standing kicks ands marches, side steps   Person(s) Educated Patient   Methods Explanation;Demonstration;Handout   Comprehension Verbalized understanding          PT Short Term Goals - 02/15/17 1201      PT SHORT TERM GOAL #1   Title independent with initial HEP   Time 2   Period Weeks   Status New           PT Long Term Goals - 02/15/17 1202      PT LONG TERM GOAL #1   Title increase LE strength to 4/5   Time 8   Period Weeks   Status New     PT LONG TERM GOAL #2   Title decrease TUG time to 13 seconds   Time 8   Period Weeks   Status New     PT LONG TERM GOAL #3   Title increase Berg balance score to 48/56   Time 8   Period Weeks   Status New     PT LONG TERM GOAL #4   Title understand fall risks at home   Time 8  Period Weeks   Status New     PT LONG TERM GOAL #5   Title walk 800 feet without rest   Time 8   Period  Weeks   Status New               Plan - 02/15/17 1158    Clinical Impression Statement Pateint had s significant fall in September, her reports 2 other falls since that time and then a fear of falling with him decreasing his activities.  He reports recently it is difficult to stand from a sitting position.  His Timed up and go test was 19 seconds and his Berg balance test was 43/56, both of which putting him at a high risk of falls.   Rehab Potential Good   PT Frequency 2x / week   PT Duration 8 weeks   PT Treatment/Interventions Gait training;Stair training;Functional mobility training;Patient/family education;Neuromuscular re-education;Balance training;Therapeutic exercise;Therapeutic activities   PT Next Visit Plan add gym exercises and balance   Consulted and Agree with Plan of Care Patient      Patient will benefit from skilled therapeutic intervention in order to improve the following deficits and impairments:  Abnormal gait, Decreased activity tolerance, Decreased balance, Decreased mobility, Decreased strength, Decreased endurance, Decreased range of motion, Difficulty walking  Visit Diagnosis: Repeated falls - Plan: PT plan of care cert/re-cert  Muscle weakness (generalized) - Plan: PT plan of care cert/re-cert      G-Codes - 31/49/70 1204    Functional Assessment Tool Used (Outpatient Only) foto 60% limitation   Functional Limitation Mobility: Walking and moving around   Mobility: Walking and Moving Around Current Status [phone]) At least 60 percent but less than 80 percent impaired, limited or restricted   Mobility: Walking and Moving Around Goal Status [phone]) At least 40 percent but less than 60 percent impaired, limited or restricted       Problem List Patient Active Problem List   Diagnosis Date Noted  . OBSTRUCTIVE SLEEP APNEA 05/28/2010  . PSA, INCREASED 11/30/2009  . LOW BACK PAIN SYNDROME 08/02/2008  . NECK SPRAIN AND STRAIN 08/02/2008  . POLYP, COLON  01/18/2007  . DIABETES MELLITUS, TYPE II 01/18/2007  . HYPERLIPIDEMIA 01/18/2007  . HYPERTENSION 01/18/2007  . DIVERTICULOSIS, COLON 01/18/2007    Sumner Boast., PT 02/15/2017, 12:07 PM  [place] [address] [place], [place], [address] Phone: [phone]   Fax:  [phone]  Name: [name] MRN: [id number] Date of Birth: [birth year]

## 2017-02-18 ENCOUNTER — Encounter: Payer: Self-pay | Admitting: Physical Therapy

## 2017-02-18 ENCOUNTER — Ambulatory Visit: Payer: Medicare HMO | Admitting: Physical Therapy

## 2017-02-18 DIAGNOSIS — R296 Repeated falls: Secondary | ICD-10-CM

## 2017-02-18 DIAGNOSIS — M6281 Muscle weakness (generalized): Secondary | ICD-10-CM

## 2017-02-18 NOTE — Therapy (Signed)
[place], [place], [address] Phone: [phone]   Fax:  [phone]  Physical Therapy Treatment  Patient Details  Name: [name] MRN: [id number] Date of Birth: 1941/07/16 Referring Provider: Chales Salmon  Encounter Date: 02/18/2017      PT End of Session - 02/18/17 1011    Visit Number 2   Date for PT Re-Evaluation 04/17/17   PT Start Time 0930   PT Stop Time 1011   PT Time Calculation (min) 41 min   Activity Tolerance Patient tolerated treatment well   Behavior During Therapy [facility] for tasks assessed/performed      Past Medical History:  Diagnosis Date  . Diabetes mellitus   . Diverticulosis   . Hyperlipidemia   . Hypertension   . Type 2 diabetes mellitus ([place])     Past Surgical History:  Procedure Laterality Date  . ACHILLES TENDON SURGERY     L  . TONSILLECTOMY      There were no vitals filed for this visit.      Subjective Assessment - 02/18/17 0932    Subjective Pt reports that he has been doing his HEP, Pt reports No stumbles or falls.   Currently in Pain? No/denies   Pain Score 0-No pain                         [facility] Adult PT Treatment/Exercise - 02/18/17 0001      High Level Balance   High Level Balance Activities Side stepping     Knee/Hip Exercises: Aerobic   Nustep level 4 x 7 minutes     Knee/Hip Exercises: Machines for Strengthening   Cybex Knee Extension 15lb 2x10   Cybex Knee Flexion 35lb 2x10   Cybex Leg Press 30lb 2x15; heel raises 30lb 2x15     Other Machine Rows Lats 35lb 2x10     Knee/Hip Exercises: Standing   Lateral Step Up 1 set;Both;10 reps;Step Height: 6"  With UE assist    Other Standing Knee Exercises 3lb standing march 2x10     Knee/Hip Exercises: Seated   Hamstring Curl Both;1 set;15 reps   Hamstring Limitations Green    Sit to [employer] 2 sets;without UE support;10 reps                  PT  Short Term Goals - 02/18/17 1012      PT SHORT TERM GOAL #1   Title independent with initial HEP   Status Achieved           PT Long Term Goals - 02/15/17 1202      PT LONG TERM GOAL #1   Title increase LE strength to 4/5   Time 8   Period Weeks   Status New     PT LONG TERM GOAL #2   Title decrease TUG time to 13 seconds   Time 8   Period Weeks   Status New     PT LONG TERM GOAL #3   Title increase Berg balance score to 48/56   Time 8   Period Weeks   Status New     PT LONG TERM GOAL #4   Title understand fall risks at home   Time 8   Period Weeks   Status New     PT LONG TERM GOAL #5   Title walk 800 feet without rest   Time 8   Period Weeks  Status New               Plan - 02/18/17 1012    Clinical Impression Statement Pt reports no falls since last visit, tolerated all of today's exercises without reports of increase pain. Cues needed to keep hips facing forward when side stepping. Gave pt green Tband so he could work on HS curls at home.   Rehab Potential Good   PT Frequency 2x / week   PT Duration 8 weeks   PT Treatment/Interventions Gait training;Stair training;Functional mobility training;Patient/family education;Neuromuscular re-education;Balance training;Therapeutic exercise;Therapeutic activities   PT Next Visit Plan add gym exercises and balance      Patient will benefit from skilled therapeutic intervention in order to improve the following deficits and impairments:     Visit Diagnosis: Repeated falls  Muscle weakness (generalized)     Problem List Patient Active Problem List   Diagnosis Date Noted  . OBSTRUCTIVE SLEEP APNEA 05/28/2010  . PSA, INCREASED 11/30/2009  . LOW BACK PAIN SYNDROME 08/02/2008  . NECK SPRAIN AND STRAIN 08/02/2008  . POLYP, COLON 01/18/2007  . DIABETES MELLITUS, TYPE II 01/18/2007  . HYPERLIPIDEMIA 01/18/2007  . HYPERTENSION 01/18/2007  . DIVERTICULOSIS, COLON 01/18/2007    Scot Jun,  PTA 02/18/2017, 10:14 AM  [place], [place], [address] Phone: [phone]   Fax:  [phone]  Name: [name] MRN: [id number] Date of Birth: 08-10-1941

## 2017-02-22 ENCOUNTER — Ambulatory Visit: Payer: Medicare HMO | Admitting: Physical Therapy

## 2017-02-22 ENCOUNTER — Encounter: Payer: Self-pay | Admitting: Physical Therapy

## 2017-02-22 DIAGNOSIS — M6281 Muscle weakness (generalized): Secondary | ICD-10-CM

## 2017-02-22 DIAGNOSIS — R296 Repeated falls: Secondary | ICD-10-CM

## 2017-02-22 NOTE — Therapy (Signed)
[place], [place], [address] Phone: [phone]   Fax:  [phone]  Physical Therapy Treatment  Patient Details  Name: [name] MRN: [id number] Date of Birth: [birth year] Referring Provider: Chales Salmon  Encounter Date: 02/22/2017      PT End of Session - 02/22/17 1050    Visit Number 3   Date for PT Re-Evaluation 04/17/17   PT Start Time 0160   PT Stop Time 1055   PT Time Calculation (min) 40 min   Activity Tolerance Patient tolerated treatment well   Behavior During Therapy [facility] for tasks assessed/performed      Past Medical History:  Diagnosis Date  . Diabetes mellitus   . Diverticulosis   . Hyperlipidemia   . Hypertension   . Type 2 diabetes mellitus ([place])     Past Surgical History:  Procedure Laterality Date  . ACHILLES TENDON SURGERY     L  . TONSILLECTOMY      There were no vitals filed for this visit.      Subjective Assessment - 02/22/17 1016    Subjective "After the last one it took two days to recover from soreness"   Currently in Pain? No/denies   Pain Score 0-No pain                         [facility] Adult PT Treatment/Exercise - 02/22/17 0001      High Level Balance   High Level Balance Activities Side stepping;Marching forwards;Backward walking;Negotitating around obstacles;Negotiating over obstacles  cues needed with marching   High Level Balance Comments standing ball toss on airex     Knee/Hip Exercises: Aerobic   Recumbent Bike L0 x 4 min   Nustep level 4 x 7 minutes     Knee/Hip Exercises: Machines for Strengthening   Cybex Knee Extension 10lb 2x10   Cybex Knee Flexion 25lb 2x10   Other Machine Rows Lats 35lb 3x10     Knee/Hip Exercises: Seated   Sit to Sand 2 sets;without UE support;10 reps  on airex pad, 2nd set only 7 reps                   PT Short Term Goals - 02/18/17 1012      PT SHORT TERM GOAL  #1   Title independent with initial HEP   Status Achieved           PT Long Term Goals - 02/15/17 1202      PT LONG TERM GOAL #1   Title increase LE strength to 4/5   Time 8   Period Weeks   Status New     PT LONG TERM GOAL #2   Title decrease TUG time to 13 seconds   Time 8   Period Weeks   Status New     PT LONG TERM GOAL #3   Title increase Berg balance score to 48/56   Time 8   Period Weeks   Status New     PT LONG TERM GOAL #4   Title understand fall risks at home   Time 8   Period Weeks   Status New     PT LONG TERM GOAL #5   Title walk 800 feet without rest   Time 8   Period Weeks   Status New               Plan - 02/22/17  [place] off some resistance interventions do to pt c/o increase soreness. Pt able to complete all of today's exercises well. Does have some issues with standing forward march requiring cues to pick knees up higher. Pt also requires cues not to lean back during standing ball toss.   Rehab Potential Good   PT Frequency 2x / week   PT Duration 8 weeks   PT Treatment/Interventions Gait training;Stair training;Functional mobility training;Patient/family education;Neuromuscular re-education;Balance training;Therapeutic exercise;Therapeutic activities   PT Next Visit Plan add gym exercises and balance      Patient will benefit from skilled therapeutic intervention in order to improve the following deficits and impairments:  Abnormal gait, Decreased activity tolerance, Decreased balance, Decreased mobility, Decreased strength, Decreased endurance, Decreased range of motion, Difficulty walking  Visit Diagnosis: Repeated falls  Muscle weakness (generalized)     Problem List Patient Active Problem List   Diagnosis Date Noted  . OBSTRUCTIVE SLEEP APNEA 05/28/2010  . PSA, INCREASED 11/30/2009  . LOW BACK PAIN SYNDROME 08/02/2008  . NECK SPRAIN AND STRAIN 08/02/2008  . POLYP, COLON 01/18/2007   . DIABETES MELLITUS, TYPE II 01/18/2007  . HYPERLIPIDEMIA 01/18/2007  . HYPERTENSION 01/18/2007  . DIVERTICULOSIS, COLON 01/18/2007    Scot Jun, PTA 02/22/2017, 10:53 AM  [place], [place], [address] Phone: [phone]   Fax:  [phone]  Name: [name] MRN: [id number] Date of Birth: 04-14-1941

## 2017-02-24 ENCOUNTER — Ambulatory Visit: Payer: Medicare HMO | Admitting: Physical Therapy

## 2017-02-24 ENCOUNTER — Encounter: Payer: Self-pay | Admitting: Physical Therapy

## 2017-02-24 DIAGNOSIS — R296 Repeated falls: Secondary | ICD-10-CM | POA: Diagnosis not present

## 2017-02-24 DIAGNOSIS — M6281 Muscle weakness (generalized): Secondary | ICD-10-CM

## 2017-02-24 NOTE — Therapy (Signed)
[place], [place], [address] Phone: [phone]   Fax:  [phone]  Physical Therapy Treatment  Patient Details  Name: [name] MRN: [id number] Date of Birth: Mar 24, 1941 Referring Provider: Chales Salmon  Encounter Date: 02/24/2017      PT End of Session - 02/24/17 1052    Visit Number 4   Date for PT Re-Evaluation 04/17/17   PT Start Time 8563   PT Stop Time 1056   PT Time Calculation (min) 41 min   Activity Tolerance Patient tolerated treatment well   Behavior During Therapy [facility] for tasks assessed/performed      Past Medical History:  Diagnosis Date  . Diabetes mellitus   . Diverticulosis   . Hyperlipidemia   . Hypertension   . Type 2 diabetes mellitus ([place])     Past Surgical History:  Procedure Laterality Date  . ACHILLES TENDON SURGERY     L  . TONSILLECTOMY      There were no vitals filed for this visit.      Subjective Assessment - 02/24/17 1016    Subjective "Im doing well, Much better after last time"   Currently in Pain? No/denies   Pain Score 0-No pain                         [facility] Adult PT Treatment/Exercise - 02/24/17 0001      High Level Balance   High Level Balance Activities Negotiating over obstacles;Negotitating around obstacles   High Level Balance Comments side stepping over objects, side stepping on to airex      Exercises   Exercises Lumbar     Lumbar Exercises: Machines for Strengthening   Cybex Leg Press 30lb 3x10; heel raises 30lb 2x15       Lumbar Exercises: Standing   Row 10 reps;Power tower;Strengthening  x2 rev grip    Row Limitations 35     Knee/Hip Exercises: Aerobic   Nustep level 4 x 7 minutes     Knee/Hip Exercises: Machines for Strengthening   Other Machine Rows & Lats 35lb 3x10                  PT Short Term Goals - 02/18/17 1012      PT SHORT TERM GOAL #1   Title independent  with initial HEP   Status Achieved           PT Long Term Goals - 02/15/17 1202      PT LONG TERM GOAL #1   Title increase LE strength to 4/5   Time 8   Period Weeks   Status New     PT LONG TERM GOAL #2   Title decrease TUG time to 13 seconds   Time 8   Period Weeks   Status New     PT LONG TERM GOAL #3   Title increase Berg balance score to 48/56   Time 8   Period Weeks   Status New     PT LONG TERM GOAL #4   Title understand fall risks at home   Time 8   Period Weeks   Status New     PT LONG TERM GOAL #5   Title walk 800 feet without rest   Time 8   Period Weeks   Status New               Plan - 02/24/17 1056  Clinical Impression Statement Increase rest time between strengthening exercises to help with soreness. Good strength and ROM with machine exercises, does require cues to performed each exercises correctly. Pt will often times do only half rep. Difficulty side stepping over objects in regards to foot clearance.    Rehab Potential Good   PT Frequency 2x / week   PT Duration 8 weeks   PT Treatment/Interventions Gait training;Stair training;Functional mobility training;Patient/family education;Neuromuscular re-education;Balance training;Therapeutic exercise;Therapeutic activities   PT Next Visit Plan add gym exercises and balance      Patient will benefit from skilled therapeutic intervention in order to improve the following deficits and impairments:  Abnormal gait, Decreased activity tolerance, Decreased balance, Decreased mobility, Decreased strength, Decreased endurance, Decreased range of motion, Difficulty walking  Visit Diagnosis: Repeated falls  Muscle weakness (generalized)     Problem List Patient Active Problem List   Diagnosis Date Noted  . OBSTRUCTIVE SLEEP APNEA 05/28/2010  . PSA, INCREASED 11/30/2009  . LOW BACK PAIN SYNDROME 08/02/2008  . NECK SPRAIN AND STRAIN 08/02/2008  . POLYP, COLON 01/18/2007  . DIABETES  MELLITUS, TYPE II 01/18/2007  . HYPERLIPIDEMIA 01/18/2007  . HYPERTENSION 01/18/2007  . DIVERTICULOSIS, COLON 01/18/2007    Scot Jun, PTA 02/24/2017, 10:59 AM  [place], [place], [address] Phone: [phone]   Fax:  [phone]  Name: [name] MRN: [id number] Date of Birth: September 05, 1941

## 2017-02-25 ENCOUNTER — Encounter: Payer: Medicare HMO | Admitting: Physical Therapy

## 2017-03-01 ENCOUNTER — Ambulatory Visit: Payer: Medicare HMO | Admitting: Physical Therapy

## 2017-03-01 ENCOUNTER — Encounter: Payer: Self-pay | Admitting: Physical Therapy

## 2017-03-01 DIAGNOSIS — M6281 Muscle weakness (generalized): Secondary | ICD-10-CM

## 2017-03-01 DIAGNOSIS — R296 Repeated falls: Secondary | ICD-10-CM

## 2017-03-01 NOTE — Therapy (Signed)
[place], [place], [address] Phone: [phone]   Fax:  [phone]  Physical Therapy Treatment  Patient Details  Name: [name] MRN: [id number] Date of Birth: 12/15/1940 Referring Provider: Chales Salmon  Encounter Date: 03/01/2017      PT End of Session - 03/01/17 1055    Visit Number 5   Date for PT Re-Evaluation 04/17/17   PT Start Time 3903   PT Stop Time 1055   PT Time Calculation (min) 40 min   Activity Tolerance Patient tolerated treatment well   Behavior During Therapy [facility] for tasks assessed/performed      Past Medical History:  Diagnosis Date  . Diabetes mellitus   . Diverticulosis   . Hyperlipidemia   . Hypertension   . Type 2 diabetes mellitus ([place])     Past Surgical History:  Procedure Laterality Date  . ACHILLES TENDON SURGERY     L  . TONSILLECTOMY      There were no vitals filed for this visit.      Subjective Assessment - 03/01/17 1022    Subjective "A little low this morning, this is the second day in a row I have woken up with low energy."   Currently in Pain? No/denies   Pain Score 0-No pain                         [facility] Adult PT Treatment/Exercise - 03/01/17 0001      Lumbar Exercises: Standing   Row Both;15 reps;Theraband  x2   Theraband Level (Row) Level 4 (Blue)   Shoulder Extension Both;15 reps;Theraband  x2   Theraband Level (Shoulder Extension) Level 4 (Blue)     Knee/Hip Exercises: Aerobic   Nustep level 4 x 7 minutes     Knee/Hip Exercises: Machines for Strengthening   Cybex Knee Extension 10lb 2x10   Cybex Knee Flexion 25lb 2x10   Cybex Leg Press 30lb 2x15; heel raises 30lb 2x15     Other Machine Rows & Lats 35lb 3x10     Knee/Hip Exercises: Standing   Forward Step Up 2 sets;Both;5 reps;Hand Hold: 0;Step Height: 6"                  PT Short Term Goals - 02/18/17 1012      PT SHORT TERM  GOAL #1   Title independent with initial HEP   Status Achieved           PT Long Term Goals - 03/01/17 1058      PT LONG TERM GOAL #1   Title increase LE strength to 4/5   Status On-going     PT LONG TERM GOAL #2   Title decrease TUG time to 13 seconds   Status On-going     PT LONG TERM GOAL #3   Title increase Berg balance score to 48/56   Status On-going     PT LONG TERM GOAL #4   Title understand fall risks at home   Status Partially Met     PT LONG TERM GOAL #5   Title walk 800 feet without rest   Status On-going               Plan - 03/01/17 1056    Clinical Impression Statement No issues with today's activities. Pt does fatigue quick with step ups. Cues needed to push down through the stepping foot and not to  jump to reach the top of platform. Pt quickly fatigues with step ups requiring a seated rest break between sets. No reports of pain during today's session.   Rehab Potential Good   PT Frequency 2x / week   PT Duration 8 weeks   PT Treatment/Interventions Gait training;Stair training;Functional mobility training;Patient/family education;Neuromuscular re-education;Balance training;Therapeutic exercise;Therapeutic activities   PT Next Visit Plan add gym exercises and balance      Patient will benefit from skilled therapeutic intervention in order to improve the following deficits and impairments:  Abnormal gait, Decreased activity tolerance, Decreased balance, Decreased mobility, Decreased strength, Decreased endurance, Decreased range of motion, Difficulty walking  Visit Diagnosis: Repeated falls  Muscle weakness (generalized)     Problem List Patient Active Problem List   Diagnosis Date Noted  . OBSTRUCTIVE SLEEP APNEA 05/28/2010  . PSA, INCREASED 11/30/2009  . LOW BACK PAIN SYNDROME 08/02/2008  . NECK SPRAIN AND STRAIN 08/02/2008  . POLYP, COLON 01/18/2007  . DIABETES MELLITUS, TYPE II 01/18/2007  . HYPERLIPIDEMIA 01/18/2007  .  HYPERTENSION 01/18/2007  . DIVERTICULOSIS, COLON 01/18/2007    Scot Jun 03/01/2017, 10:58 AM  [place] [address] [place], [place], [address] Phone: [phone]   Fax:  [phone]  Name: [name] MRN: [id number] Date of Birth: 1941-08-30

## 2017-03-03 ENCOUNTER — Ambulatory Visit: Payer: Medicare HMO | Admitting: Physical Therapy

## 2017-03-08 ENCOUNTER — Ambulatory Visit: Payer: Medicare HMO | Admitting: Physical Therapy

## 2017-03-08 ENCOUNTER — Encounter: Payer: Self-pay | Admitting: Physical Therapy

## 2017-03-08 DIAGNOSIS — R296 Repeated falls: Secondary | ICD-10-CM | POA: Diagnosis not present

## 2017-03-08 DIAGNOSIS — M6281 Muscle weakness (generalized): Secondary | ICD-10-CM

## 2017-03-08 NOTE — Therapy (Signed)
[place], [place], [address] Phone: [phone]   Fax:  [phone]  Physical Therapy Treatment  Patient Details  Name: [name] MRN: [id number] Date of Birth: 26-Sep-1941 Referring Provider: Chales Salmon  Encounter Date: 03/08/2017      PT End of Session - 03/08/17 1056    Visit Number 6   Date for PT Re-Evaluation 04/17/17   PT Start Time 9935   PT Stop Time 1056   PT Time Calculation (min) 41 min   Activity Tolerance Patient tolerated treatment well   Behavior During Therapy [facility] for tasks assessed/performed      Past Medical History:  Diagnosis Date  . Diabetes mellitus   . Diverticulosis   . Hyperlipidemia   . Hypertension   . Type 2 diabetes mellitus ([place])     Past Surgical History:  Procedure Laterality Date  . ACHILLES TENDON SURGERY     L  . TONSILLECTOMY      There were no vitals filed for this visit.      Subjective Assessment - 03/08/17 1017    Subjective "Going good, I did my exercises yesterday and this morning"   Currently in Pain? Yes   Pain Score 4    Pain Location Leg            [facility] PT Assessment - 03/08/17 0001      Standardized Balance Assessment   Standardized Balance Assessment Timed Up and Go Test;Berg Balance Test     Berg Balance Test   Sit to Stand Able to stand without using hands and stabilize independently   Standing Unsupported Able to stand safely 2 minutes   Sitting with Back Unsupported but Feet Supported on Floor or Stool Able to sit safely and securely 2 minutes   Stand to Sit Sits safely with minimal use of hands   Transfers Able to transfer safely, minor use of hands   Standing Unsupported with Eyes Closed Able to stand 10 seconds safely   Standing Ubsupported with Feet Together Able to place feet together independently and stand 1 minute safely   From Standing, Reach Forward with Outstretched Arm Can reach  confidently >25 cm (10")   From Standing Position, Pick up Object from Floor Able to pick up shoe safely and easily   From Standing Position, Turn to Look Behind Over each Shoulder Looks behind one side only/other side shows less weight shift   Turn 360 Degrees Able to turn 360 degrees safely in 4 seconds or less   Standing Unsupported, Alternately Place Feet on Step/Stool Able to stand independently and safely and complete 8 steps in 20 seconds   Standing Unsupported, One Foot in Front Needs help to step but can hold 15 seconds   Standing on One Leg Tries to lift leg/unable to hold 3 seconds but remains standing independently   Total Score 49     Timed Up and Go Test   Normal TUG (seconds) 7.48                     [facility] Adult PT Treatment/Exercise - 03/08/17 0001      Ambulation/Gait   Ambulation/Gait Yes   Ambulation/Gait Assistance 6: Modified independent (Device/Increase time)   Ambulation Distance (Feet) --  >500   Assistive device Straight cane   Gait Pattern Step-through pattern   Ambulation Surface Unlevel;Outdoor;Paved   Gait Comments Gait outside around building, up and down slopes.  some in crease gait speed but able to control, decrease gait speed up hill with forward lean      Lumbar Exercises: Machines for Strengthening   Other Lumbar Machine Exercise Rows & Lats 2x15      Knee/Hip Exercises: Aerobic   Nustep level 4 x 7 minutes     Knee/Hip Exercises: Machines for Strengthening   Cybex Leg Press 40lb 2x15                  PT Short Term Goals - 02/18/17 1012      PT SHORT TERM GOAL #1   Title independent with initial HEP   Status Achieved           PT Long Term Goals - 03/08/17 1037      PT LONG TERM GOAL #2   Title decrease TUG time to 13 seconds   Status Achieved     PT LONG TERM GOAL #3   Title increase Berg balance score to 48/56   Status Achieved     PT LONG TERM GOAL #5   Title walk 800 feet without rest   Status  Achieved               Plan - 03/08/17 1056    Clinical Impression Statement Pt has progressed completing some of his LTG's. Does have some instability with tand and single leg standing, He reports that he has his good and bad days but with little fluctuation in between. With this fluctuation he reports decrease energy and balance.   Rehab Potential Good   PT Frequency 2x / week   PT Duration 8 weeks   PT Treatment/Interventions Gait training;Stair training;Functional mobility training;Patient/family education;Neuromuscular re-education;Balance training;Therapeutic exercise;Therapeutic activities   PT Next Visit Plan add gym exercises and balance      Patient will benefit from skilled therapeutic intervention in order to improve the following deficits and impairments:  Abnormal gait, Decreased activity tolerance, Decreased balance, Decreased mobility, Decreased strength, Decreased endurance, Decreased range of motion, Difficulty walking  Visit Diagnosis: Repeated falls  Muscle weakness (generalized)     Problem List Patient Active Problem List   Diagnosis Date Noted  . OBSTRUCTIVE SLEEP APNEA 05/28/2010  . PSA, INCREASED 11/30/2009  . LOW BACK PAIN SYNDROME 08/02/2008  . NECK SPRAIN AND STRAIN 08/02/2008  . POLYP, COLON 01/18/2007  . DIABETES MELLITUS, TYPE II 01/18/2007  . HYPERLIPIDEMIA 01/18/2007  . HYPERTENSION 01/18/2007  . DIVERTICULOSIS, COLON 01/18/2007    Scot Jun, PTA 03/08/2017, 10:58 AM  [place], [place], [address] Phone: [phone]   Fax:  [phone]  Name: [name] MRN: [id number] Date of Birth: [birth year]

## 2017-03-10 ENCOUNTER — Encounter: Payer: Self-pay | Admitting: Physical Therapy

## 2017-03-10 ENCOUNTER — Ambulatory Visit: Payer: Medicare HMO | Admitting: Physical Therapy

## 2017-03-10 DIAGNOSIS — R296 Repeated falls: Secondary | ICD-10-CM | POA: Diagnosis not present

## 2017-03-10 DIAGNOSIS — M6281 Muscle weakness (generalized): Secondary | ICD-10-CM

## 2017-03-10 NOTE — Therapy (Signed)
[facility] [place], [place], [address] Phone: [phone]   Fax:  [phone]  Physical Therapy Treatment  Patient Details  Name: [name] MRN: [id number] Date of Birth: 1940/10/22 Referring Provider: Chales Salmon  Encounter Date: 03/10/2017      PT End of Session - 03/10/17 1054    Visit Number 7   Date for PT Re-Evaluation 04/17/17   PT Start Time 7915   PT Stop Time 1056   PT Time Calculation (min) 41 min   Activity Tolerance Patient tolerated treatment well   Behavior During Therapy [facility] for tasks assessed/performed      Past Medical History:  Diagnosis Date  . Diabetes mellitus   . Diverticulosis   . Hyperlipidemia   . Hypertension   . Type 2 diabetes mellitus ([place])     Past Surgical History:  Procedure Laterality Date  . ACHILLES TENDON SURGERY     L  . TONSILLECTOMY      There were no vitals filed for this visit.      Subjective Assessment - 03/10/17 1016    Subjective "All right"   Currently in Pain? No/denies   Pain Score 0-No pain                         [facility] Adult PT Treatment/Exercise - 03/10/17 0001      Ambulation/Gait   Ambulation/Gait Yes   Ambulation/Gait Assistance 5: Supervision;6: Modified independent (Device/Increase time)   Ambulation Distance (Feet) --  > 500 ft    Assistive device None   Gait Pattern Step-through pattern   Ambulation Surface Unlevel;Outdoor;Paved   Gait Comments Gait outside around building, up and down slopes. some in crease gait speed but able to control, decrease gait speed up hill with forward lean      Lumbar Exercises: Machines for Strengthening   Leg Press SL 30lb 2x10      Knee/Hip Exercises: Aerobic   Nustep level 4 x 7 minutes     Knee/Hip Exercises: Machines for Strengthening   Cybex Knee Extension 10lb 2x10   Cybex Knee Flexion 25lb 2x15     Knee/Hip Exercises: Standing   Lateral Step  Up Both;1 set;Hand Hold: 0;10 reps   Forward Step Up Both;1 set;10 reps;Hand Hold: 0;Step Height: 6"   Other Standing Knee Exercises 2lb standing march 2x10     Knee/Hip Exercises: Seated   Other Seated Knee/Hip Exercises seated march 2lb x10                   PT Short Term Goals - 02/18/17 1012      PT SHORT TERM GOAL #1   Title independent with initial HEP   Status Achieved           PT Long Term Goals - 03/10/17 1055      PT LONG TERM GOAL #1   Title increase LE strength to 4/5   Status On-going               Plan - 03/10/17 1055    Clinical Impression Statement Pt with a decrease activity tolerance this date. Pt reports that he carry the can with him incase he stumbles, Pt likes to carry [facility] for a few steps instead of using it for each step. Pt given a skilled demo on how to use [facility] and was informed that he needed to use it correctly or not at  all. During step ups pt tent to hop in order to lift on to box reports weakness and instability for the reason.   Rehab Potential Good   PT Frequency 2x / week   PT Duration 8 weeks   PT Treatment/Interventions Gait training;Stair training;Functional mobility training;Patient/family education;Neuromuscular re-education;Balance training;Therapeutic exercise;Therapeutic activities   PT Next Visit Plan add gym exercises and balance      Patient will benefit from skilled therapeutic intervention in order to improve the following deficits and impairments:  Abnormal gait, Decreased activity tolerance, Decreased balance, Decreased mobility, Decreased strength, Decreased endurance, Decreased range of motion, Difficulty walking  Visit Diagnosis: Muscle weakness (generalized)  Repeated falls     Problem List Patient Active Problem List   Diagnosis Date Noted  . OBSTRUCTIVE SLEEP APNEA 05/28/2010  . PSA, INCREASED 11/30/2009  . LOW BACK PAIN SYNDROME 08/02/2008  . NECK SPRAIN AND STRAIN 08/02/2008  . POLYP, COLON  01/18/2007  . DIABETES MELLITUS, TYPE II 01/18/2007  . HYPERLIPIDEMIA 01/18/2007  . HYPERTENSION 01/18/2007  . DIVERTICULOSIS, COLON 01/18/2007    Scot Jun, PTA 03/10/2017, 10:59 AM  [place], [place], [address] Phone: [phone]   Fax:  [phone]  Name: BRAVE DACK MRN: [id number] Date of Birth: 08-14-1941

## 2017-03-15 ENCOUNTER — Ambulatory Visit: Payer: Medicare HMO | Attending: Psychiatry | Admitting: Physical Therapy

## 2017-03-15 ENCOUNTER — Encounter: Payer: Self-pay | Admitting: Physical Therapy

## 2017-03-15 DIAGNOSIS — M6281 Muscle weakness (generalized): Secondary | ICD-10-CM | POA: Insufficient documentation

## 2017-03-15 DIAGNOSIS — R296 Repeated falls: Secondary | ICD-10-CM | POA: Insufficient documentation

## 2017-03-15 NOTE — Therapy (Signed)
[place], [place], [address] Phone: [phone]   Fax:  [phone]  Physical Therapy Treatment  Patient Details  Name: [name] MRN: [id number] Date of Birth: 03-Sep-1941 Referring Provider: Chales Salmon  Encounter Date: 03/15/2017      PT End of Session - 03/15/17 1034    Visit Number 8   Date for PT Re-Evaluation 04/17/17   PT Start Time 4235   PT Stop Time 1035   PT Time Calculation (min) 20 min   Activity Tolerance Patient tolerated treatment well   Behavior During Therapy [facility] for tasks assessed/performed      Past Medical History:  Diagnosis Date  . Diabetes mellitus   . Diverticulosis   . Hyperlipidemia   . Hypertension   . Type 2 diabetes mellitus ([place])     Past Surgical History:  Procedure Laterality Date  . ACHILLES TENDON SURGERY     L  . TONSILLECTOMY      There were no vitals filed for this visit.      Subjective Assessment - 03/15/17 1017    Subjective "Getting off those statins has really helped, less discomfort"   Currently in Pain? No/denies   Pain Score 0-No pain                         [facility] Adult PT Treatment/Exercise - 03/15/17 0001      Ambulation/Gait   Stairs Yes   Stair Management Technique One rail Right;Alternating pattern   Number of Stairs 24   Height of Stairs 6   Gait Comments fatigues quick     Knee/Hip Exercises: Aerobic   Elliptical I5 R3 x2 min   Nustep level 4 x 7 minutes                  PT Short Term Goals - 03/15/17 1037      PT SHORT TERM GOAL #1   Title independent with initial HEP           PT Long Term Goals - 03/15/17 1037      PT LONG TERM GOAL #1   Title increase LE strength to 4/5   Status On-going     PT LONG TERM GOAL #2   Title decrease TUG time to 13 seconds   Status Achieved     PT LONG TERM GOAL #3   Title increase Berg balance score to 48/56   Status  Achieved     PT LONG TERM GOAL #4   Title understand fall risks at home   Status Partially Met     PT LONG TERM GOAL #5   Title walk 800 feet without rest   Status Achieved               Plan - 03/15/17 1034    Clinical Impression Statement Pt has progressed completing most of his LTG's. He reports that he no longer has to worry about discomfort in the mornings. PT quickly fatigues with elliptical and stair negotiation but reports no pain.Pt expressed that he is pleased with his current functional status and would like to continue on his own. Pt  reports that he has a exercise machine at home as well as some stairs.    Rehab Potential Good   PT Frequency 2x / week   PT Duration 8 weeks   PT Treatment/Interventions Gait training;Stair training;Functional mobility training;Patient/family education;Neuromuscular re-education;Balance training;Therapeutic  exercise;Therapeutic activities   PT Next Visit Plan D/C PT      Patient will benefit from skilled therapeutic intervention in order to improve the following deficits and impairments:  Abnormal gait, Decreased activity tolerance, Decreased balance, Decreased mobility, Decreased strength, Decreased endurance, Decreased range of motion, Difficulty walking  Visit Diagnosis: Repeated falls  Muscle weakness (generalized)     Problem List Patient Active Problem List   Diagnosis Date Noted  . OBSTRUCTIVE SLEEP APNEA 05/28/2010  . PSA, INCREASED 11/30/2009  . LOW BACK PAIN SYNDROME 08/02/2008  . NECK SPRAIN AND STRAIN 08/02/2008  . POLYP, COLON 01/18/2007  . DIABETES MELLITUS, TYPE II 01/18/2007  . HYPERLIPIDEMIA 01/18/2007  . HYPERTENSION 01/18/2007  . DIVERTICULOSIS, COLON 01/18/2007    PHYSICAL THERAPY DISCHARGE SUMMARY  Visits from Start of Care: 8 Plan: Patient agrees to discharge.  Patient goals were not met. Patient is being discharged due to being pleased with the current functional level.  ?????     Scot Jun, PTA 03/15/2017, 10:37 AM  [place] [address] [place], [place], [address] Phone: [phone]   Fax:  [phone]  Name: [name] MRN: [id number] Date of Birth: November 14, 1940

## 2017-03-17 ENCOUNTER — Ambulatory Visit: Payer: Medicare HMO | Admitting: Physical Therapy

## 2017-10-26 ENCOUNTER — Encounter (HOSPITAL_COMMUNITY): Payer: Self-pay | Admitting: Emergency Medicine

## 2017-10-26 ENCOUNTER — Emergency Department (HOSPITAL_COMMUNITY): Payer: Medicare HMO

## 2017-10-26 ENCOUNTER — Other Ambulatory Visit: Payer: Self-pay

## 2017-10-26 ENCOUNTER — Emergency Department (HOSPITAL_COMMUNITY)
Admission: EM | Admit: 2017-10-26 | Discharge: 2017-10-27 | Disposition: A | Discharge status: Home / Self Care | Payer: Medicare HMO | Source: Home / Self Care | Attending: Emergency Medicine | Admitting: Emergency Medicine

## 2017-10-26 DIAGNOSIS — E876 Hypokalemia: Secondary | ICD-10-CM | POA: Diagnosis not present

## 2017-10-26 DIAGNOSIS — Y9222 Religious institution as the place of occurrence of the external cause: Secondary | ICD-10-CM

## 2017-10-26 DIAGNOSIS — Z808 Family history of malignant neoplasm of other organs or systems: Secondary | ICD-10-CM

## 2017-10-26 DIAGNOSIS — Z7901 Long term (current) use of anticoagulants: Secondary | ICD-10-CM

## 2017-10-26 DIAGNOSIS — I959 Hypotension, unspecified: Secondary | ICD-10-CM | POA: Diagnosis present

## 2017-10-26 DIAGNOSIS — J9600 Acute respiratory failure, unspecified whether with hypoxia or hypercapnia: Secondary | ICD-10-CM | POA: Diagnosis not present

## 2017-10-26 DIAGNOSIS — G4733 Obstructive sleep apnea (adult) (pediatric): Secondary | ICD-10-CM | POA: Diagnosis present

## 2017-10-26 DIAGNOSIS — Z7984 Long term (current) use of oral hypoglycemic drugs: Secondary | ICD-10-CM

## 2017-10-26 DIAGNOSIS — Z87891 Personal history of nicotine dependence: Secondary | ICD-10-CM

## 2017-10-26 DIAGNOSIS — Z8249 Family history of ischemic heart disease and other diseases of the circulatory system: Secondary | ICD-10-CM

## 2017-10-26 DIAGNOSIS — Y999 Unspecified external cause status: Secondary | ICD-10-CM | POA: Insufficient documentation

## 2017-10-26 DIAGNOSIS — D72829 Elevated white blood cell count, unspecified: Secondary | ICD-10-CM | POA: Diagnosis not present

## 2017-10-26 DIAGNOSIS — I10 Essential (primary) hypertension: Secondary | ICD-10-CM | POA: Insufficient documentation

## 2017-10-26 DIAGNOSIS — R509 Fever, unspecified: Secondary | ICD-10-CM | POA: Diagnosis not present

## 2017-10-26 DIAGNOSIS — R402312 Coma scale, best motor response, none, at arrival to emergency department: Secondary | ICD-10-CM | POA: Diagnosis present

## 2017-10-26 DIAGNOSIS — W19XXXA Unspecified fall, initial encounter: Secondary | ICD-10-CM | POA: Diagnosis present

## 2017-10-26 DIAGNOSIS — Z9119 Patient's noncompliance with other medical treatment and regimen: Secondary | ICD-10-CM

## 2017-10-26 DIAGNOSIS — Y939 Activity, unspecified: Secondary | ICD-10-CM

## 2017-10-26 DIAGNOSIS — I482 Chronic atrial fibrillation: Secondary | ICD-10-CM | POA: Diagnosis present

## 2017-10-26 DIAGNOSIS — E1165 Type 2 diabetes mellitus with hyperglycemia: Secondary | ICD-10-CM | POA: Diagnosis present

## 2017-10-26 DIAGNOSIS — D638 Anemia in other chronic diseases classified elsewhere: Secondary | ICD-10-CM | POA: Diagnosis present

## 2017-10-26 DIAGNOSIS — R402212 Coma scale, best verbal response, none, at arrival to emergency department: Secondary | ICD-10-CM | POA: Diagnosis present

## 2017-10-26 DIAGNOSIS — R569 Unspecified convulsions: Secondary | ICD-10-CM | POA: Diagnosis not present

## 2017-10-26 DIAGNOSIS — Z9181 History of falling: Secondary | ICD-10-CM

## 2017-10-26 DIAGNOSIS — S01511A Laceration without foreign body of lip, initial encounter: Secondary | ICD-10-CM | POA: Insufficient documentation

## 2017-10-26 DIAGNOSIS — Z79899 Other long term (current) drug therapy: Secondary | ICD-10-CM

## 2017-10-26 DIAGNOSIS — R402112 Coma scale, eyes open, never, at arrival to emergency department: Secondary | ICD-10-CM | POA: Diagnosis present

## 2017-10-26 DIAGNOSIS — S065X9A Traumatic subdural hemorrhage with loss of consciousness of unspecified duration, initial encounter: Secondary | ICD-10-CM | POA: Diagnosis not present

## 2017-10-26 DIAGNOSIS — E785 Hyperlipidemia, unspecified: Secondary | ICD-10-CM | POA: Diagnosis present

## 2017-10-26 DIAGNOSIS — S022XXA Fracture of nasal bones, initial encounter for closed fracture: Secondary | ICD-10-CM

## 2017-10-26 DIAGNOSIS — E8770 Fluid overload, unspecified: Secondary | ICD-10-CM | POA: Diagnosis not present

## 2017-10-26 DIAGNOSIS — E870 Hyperosmolality and hypernatremia: Secondary | ICD-10-CM | POA: Diagnosis not present

## 2017-10-26 DIAGNOSIS — Z66 Do not resuscitate: Secondary | ICD-10-CM | POA: Diagnosis not present

## 2017-10-26 DIAGNOSIS — Z515 Encounter for palliative care: Secondary | ICD-10-CM | POA: Diagnosis not present

## 2017-10-26 DIAGNOSIS — S065X0A Traumatic subdural hemorrhage without loss of consciousness, initial encounter: Secondary | ICD-10-CM | POA: Diagnosis not present

## 2017-10-26 DIAGNOSIS — Z6835 Body mass index (BMI) 35.0-35.9, adult: Secondary | ICD-10-CM

## 2017-10-26 DIAGNOSIS — Z800 Family history of malignant neoplasm of digestive organs: Secondary | ICD-10-CM

## 2017-10-26 DIAGNOSIS — Z9911 Dependence on respirator [ventilator] status: Secondary | ICD-10-CM

## 2017-10-26 LAB — ETHANOL: ALCOHOL ETHYL (B): 41 mg/dL — AB (ref <10)

## 2017-10-26 MED ORDER — "THROMBI-PAD 3""X3"" EX PADS"
1.0000 | MEDICATED_PAD | Freq: Once | CUTANEOUS | Status: AC
  Administered 2017-10-26: 1 via TOPICAL
  Filled 2017-10-26: qty 1

## 2017-10-26 MED ORDER — TRANEXAMIC ACID 1000 MG/10ML IV SOLN
500.0000 mg | Freq: Once | INTRAVENOUS | Status: AC
  Administered 2017-10-26: 500 mg via TOPICAL
  Filled 2017-10-26: qty 10

## 2017-10-26 NOTE — ED Notes (Signed)
Patient transported to CT 

## 2017-10-26 NOTE — ED Provider Notes (Signed)
[facility] EMERGENCY DEPARTMENT Provider Note   CSN: [id number] Arrival date & time: 10/26/17  1950    History   Chief Complaint Chief Complaint  Patient presents with  . Fall  . Altered Mental Status    HPI [name] is a 77 y.o. male.  77 year old male with a history of diabetes, dyslipidemia, hypertension presents to the emergency department after a fall at church.  Patient with a history of unsteady gait over the past few months.  This has been a steady decline with waxing and waning symptoms, per wife.  Patient usually ambulatory with a cane since worsening of the symptoms.  He has been followed by both his primary care doctor and neurologist for this.  Patient reports tripping while ambulating at church causing him to fall striking his face on the floor.  Floor was thinly carpeted.  He had no loss of consciousness and denies pain.  He sustained a laceration to his inner, upper lip.  This began to repeat bleed recently.  He is on chronic Coumadin for atrial fibrillation which is rate controlled.  No documented history of dementia, though wife reports history of "sundowning" which worsens with alcohol consumption.  Patient is a daily drinker, but states that he has cut back from 3 "stiff" drinks to 1 "stiff" drink.  No c/o CP or SOB prior to the fall.  No HA, N/V, extremity numbness or paresthesias, extremity weakness.      Past Medical History:  Diagnosis Date  . Diabetes mellitus   . Diverticulosis   . Hyperlipidemia   . Hypertension   . Type 2 diabetes mellitus [facility])     Patient Active Problem List   Diagnosis Date Noted  . OBSTRUCTIVE SLEEP APNEA 05/28/2010  . PSA, INCREASED 11/30/2009  . LOW BACK PAIN SYNDROME 08/02/2008  . NECK SPRAIN AND STRAIN 08/02/2008  . POLYP, COLON 01/18/2007  . DIABETES MELLITUS, TYPE II 01/18/2007  . HYPERLIPIDEMIA 01/18/2007  . HYPERTENSION 01/18/2007  . DIVERTICULOSIS, COLON 01/18/2007    Past Surgical  History:  Procedure Laterality Date  . ACHILLES TENDON SURGERY     L  . TONSILLECTOMY         Home Medications    Prior to Admission medications   Medication Sig Start Date End Date Taking? Authorizing Provider  ACCU-CHEK SMARTVIEW test strip USE TO TEST BLOOD SUGAR EVERY DAY 12/04/14  Yes Roma Schanz R, DO  Alcohol Swabs (B-D SINGLE USE SWABS REGULAR) PADS Use one swab as directed 10/24/13  Yes Ann Held, DO  Ascorbic Acid (VITAMIN C) 500 MG tablet Take 500 mg by mouth every other day.     Yes [provider]  bisoprolol (ZEBETA) 10 MG tablet Take 10 mg by mouth daily.   Yes [provider]  Blood Glucose Monitoring Suppl (ACCU-CHEK NANO SMARTVIEW) W/DEVICE KIT Check blood sugar daily. Dx Code: 250.00 11/16/13  Yes Ann Held, DO  Efinaconazole 10 % SOLN Apply daily to the affected are for 28 days Patient taking differently: Apply 1 application topically daily as needed (toenails). Apply daily to the affected are for 28 days 12/18/13  Yes Roma Schanz R, DO  finasteride (PROSCAR) 5 MG tablet Take 1 tablet (5 mg total) by mouth daily. 09/17/14  Yes Roma Schanz R, DO  glipiZIDE (GLUCOTROL) 5 MG tablet Take 1 tablet (5 mg total) by mouth daily before breakfast. Patient taking differently: Take 10 mg by mouth daily before breakfast.  05/02/14  Yes Ann Held, DO  Lancet Devices (AUTO-LANCET) MISC Test blood sugar daily. Dx Code: 250.00. Accu-Chek nano smart view device 11/16/13  Yes Carollee Herter, Yvonne R, DO  losartan (COZAAR) 100 MG tablet Take 1 tablet (100 mg total) by mouth daily. 04/15/15  Yes Roma Schanz R, DO  Magnesium Oxide 500 MG CAPS Take 1 capsule by mouth daily.   Yes [provider]  metFORMIN (GLUCOPHAGE-XR) 500 MG 24 hr tablet TAKE 2 TABLETS TWICE DAILY 12/24/14  Yes Roma Schanz R, DO  metoprolol tartrate (LOPRESSOR) 100 MG tablet Take 100 mg by mouth 2 (two) times daily.   Yes [provider]  naproxen sodium (ALEVE) 220 MG tablet Take 220 mg by mouth 2 (two) times daily.   Yes [provider]  Omega-3 Fatty Acids (FISH OIL) 1200 MG CAPS Take 2 capsules by mouth 2 (two) times daily.     Yes [provider]  sildenafil (VIAGRA) 100 MG tablet Take 1 tablet (100 mg total) by mouth as needed for erectile dysfunction. 08/29/12  Yes Roma Schanz R, DO  sitaGLIPtin (JANUVIA) 100 MG tablet Take 100 mg by mouth every evening.   Yes [provider]  triamterene-hydrochlorothiazide (DYAZIDE) 37.5-25 MG per capsule Take 1 each (1 capsule total) by mouth every morning. 04/15/15  Yes Roma Schanz R, DO  warfarin (COUMADIN) 5 MG tablet Take 5-7.5 mg by mouth daily. Alternating days   Yes [provider]  niacin (NIASPAN) 500 MG CR tablet Take 1 tablet (500 mg total) by mouth at bedtime. 12/30/11 11/15/13  Ann Held, DO    Family History Family History  Problem Relation Age of Onset  . Cancer Father        pancreatic  . Heart disease Mother   . Cancer Mother   . Cancer Other        cervical    Social History Social History   Tobacco Use  . Smoking status: Former Smoker    Packs/day: 0.30    Years: 25.00    Pack years: 7.50    Last attempt to quit: 10/11/1984    Years since quitting: 33.0  Substance Use Topics  . Alcohol use: Yes    Alcohol/week: 7.0 oz    Types: 14 Standard drinks or equivalent per week  . Drug use: No     Allergies   Patient has no known allergies.   Review of Systems Review of Systems Ten systems reviewed and are negative for acute change, except as noted in the HPI.    Physical Exam Updated Vital Signs BP (!) 147/78   Pulse (!) 102   Temp 98.5 F (36.9 C) (Oral)   Resp 12   Ht 5' 10"  (1.778 m)   Wt 107.5 kg (237 lb)   SpO2 97%   BMI 34.01 kg/m   Physical Exam  Constitutional: He is oriented to person, place, and time. He appears well-developed and well-nourished. No  distress.  Nontoxic appearing and in NAD  HENT:  Head: Normocephalic. Head is without raccoon's eyes and without Battle's sign.  Mouth/Throat: Oropharynx is clear and moist.  Mild bruising to bridge of nose. Laceration to upper inner lip to the left of the frenulum. Slow bleed associated with wound site, controlled with pressure. No malocclusion. Symmetric jaw opening.  Eyes: Conjunctivae and EOM are normal. Pupils are equal, round, and reactive to light. No scleral icterus.  Neck: Normal range of motion.  Cardiovascular: Normal rate and intact distal pulses.  Irregularly irregular rhythm  Pulmonary/Chest: Effort normal. No stridor. No respiratory distress. He has no wheezes.  Lungs CTAB  Musculoskeletal: Normal range of motion.  Neurological: He is alert and oriented to person, place, and time. He exhibits normal muscle tone. Coordination normal.  GCS 15. Speech clear. Patient moving all extremities.  Skin: Skin is warm and dry. No rash noted. He is not diaphoretic. No erythema. No pallor.  Psychiatric: He has a normal mood and affect. His behavior is normal.  Nursing note and vitals reviewed.    ED Treatments / Results  Labs (all labs ordered are listed, but only abnormal results are displayed) Labs Reviewed  ETHANOL - Abnormal; Notable for the following components:      Result Value   Alcohol, Ethyl (B) 41 (*)    All other components within normal limits  BASIC METABOLIC PANEL - Abnormal; Notable for the following components:   Glucose, Bld 135 (*)    BUN 25 (*)    All other components within normal limits  I-STAT CHEM 8, ED - Abnormal; Notable for the following components:   Potassium 6.5 (*)    BUN 40 (*)    Glucose, Bld 126 (*)    Calcium, Ion 1.05 (*)    All other components within normal limits    EKG  EKG Interpretation None       Radiology Ct Head Wo Contrast  Result Date: 10/26/2017 CLINICAL DATA:  Fall with facial trauma. Patient on anti coagulation  therapy for atrial fibrillation. EXAM: CT HEAD WITHOUT CONTRAST CT MAXILLOFACIAL WITHOUT CONTRAST TECHNIQUE: Multidetector CT imaging of the head and maxillofacial structures were performed using the standard protocol without intravenous contrast. Multiplanar CT image reconstructions of the maxillofacial structures were also generated. COMPARISON:  Brain MRI 02/15/2017 FINDINGS: CT HEAD FINDINGS Brain: No mass lesion, intraparenchymal hemorrhage or extra-axial collection. No evidence of acute cortical infarct. There is periventricular hypoattenuation compatible with chronic microvascular disease. Vascular: No hyperdense vessel or unexpected calcification. Skull: No calvarial fracture. Normal skull base. CT MAXILLOFACIAL FINDINGS Osseous: --Complex facial fracture types: No LeFort, zygomaticomaxillary complex or nasoorbitoethmoidal fracture. --Simple fracture types: Minimally displaced anterior nasal bone fracture. --Mandible, hard palate and teeth: No acute abnormality. Orbits: The globes are intact. Normal appearance of the intra- and extraconal fat. Symmetric extraocular muscles. Sinuses: No fluid levels or advanced mucosal thickening. Mild right maxillary sinus mucosal thickening. Soft tissues: Abrasion of the bridge of the nose. IMPRESSION: 1. No acute intracranial abnormality. 2. Minimally depressed fracture of the anterior nasal bone with associated soft tissue abrasion. No other facial fracture or skull fracture. Electronically Signed   By: Ulyses Jarred M.D.   On: 10/26/2017 21:37   Ct Maxillofacial Wo Contrast  Result Date: 10/26/2017 CLINICAL DATA:  Fall with facial trauma. Patient on anti coagulation therapy for atrial fibrillation. EXAM: CT HEAD WITHOUT CONTRAST CT MAXILLOFACIAL WITHOUT CONTRAST TECHNIQUE: Multidetector CT imaging of the head and maxillofacial structures were performed using the standard protocol without intravenous contrast. Multiplanar CT image reconstructions of the maxillofacial  structures were also generated. COMPARISON:  Brain MRI 02/15/2017 FINDINGS: CT HEAD FINDINGS Brain: No mass lesion, intraparenchymal hemorrhage or extra-axial collection. No evidence of acute cortical infarct. There is periventricular hypoattenuation compatible with chronic microvascular disease. Vascular: No hyperdense vessel or unexpected calcification. Skull: No calvarial fracture. Normal skull base. CT MAXILLOFACIAL FINDINGS Osseous: --Complex facial fracture types: No LeFort, zygomaticomaxillary complex or nasoorbitoethmoidal fracture. --Simple fracture types: Minimally displaced anterior  nasal bone fracture. --Mandible, hard palate and teeth: No acute abnormality. Orbits: The globes are intact. Normal appearance of the intra- and extraconal fat. Symmetric extraocular muscles. Sinuses: No fluid levels or advanced mucosal thickening. Mild right maxillary sinus mucosal thickening. Soft tissues: Abrasion of the bridge of the nose. IMPRESSION: 1. No acute intracranial abnormality. 2. Minimally depressed fracture of the anterior nasal bone with associated soft tissue abrasion. No other facial fracture or skull fracture. Electronically Signed   By: Ulyses Jarred M.D.   On: 10/26/2017 21:37    Procedures Procedures (including critical care time)  Medications Ordered in ED Medications  THROMBI-PAD 3"X3" pad 1 each (1 each Topical Given 10/26/17 2136)  tranexamic acid (CYKLOKAPRON) injection 500 mg (500 mg Topical Given by Other 10/26/17 2227)     Initial Impression / Assessment and Plan / ED Course  I have reviewed the triage vital signs and the nursing notes.  Pertinent labs & imaging results that were available during my care of the patient were reviewed by me and considered in my medical decision making (see chart for details).     77 year old male presents to the emergency department after a fall while at church.  He states that he tripped causing his fall.  Patient denies any chest pain or  shortness of breath prior to the incident.  He has had difficulty with balance over the past few months.  This has been waxing and waning, but steadily declining per wife.  Patient without evidence of focal deficits on exam today.  He is alert, answering questions appropriately.  Speech is clear.  A head CT was ordered which is negative for evidence of hemorrhage, infarct.  No traumatic skull fracture, though nasal bone fracture is seen on maxillofacial imaging.  Patient with laceration to his inner lip.  This does not require sutured repair.  Bleeding was initially difficult to control as patient on chronic Coumadin for atrial fibrillation.  Hemostasis accomplished with direct pressure as well as the use of a thrombin pad and gauze soaked in TXA.  Patient hemodynamically stable since arrival.  He feels comfortable managing his symptoms further on an outpatient basis and following up with his primary care doctor regarding his visit today.  Return precautions and supportive care discussed and provided. Patient discharged in stable condition with no unaddressed concerns.   Final Clinical Impressions(s) / ED Diagnoses   Final diagnoses:  Closed fracture of nasal bone, initial encounter  Lip laceration, initial encounter  Fall, initial encounter    ED Discharge Orders    None       Antonietta Breach, PA-C 10/16/2017 Calla Kicks, MD 10/28/17 1151

## 2017-10-26 NOTE — ED Notes (Signed)
Spoke with main lab and they will add on BMP to previous collection.

## 2017-10-26 NOTE — ED Triage Notes (Signed)
Per EMS: pt leaving church, fell and struck his nose.  Pt denies hitting head or LOC.  Pt not c/o any pain.  No obvious injury.  Pt is on anticoagulants for A-fib.  Pt experiencing slight confusion such as repeating questions and trouble recalling what happened concerning the fall.  No hx of dementia.  EMS estimated blood loss to be 50-100 cc.  PTA vitals:  135/93. BP, 97 HR, 16 RR, 98% RA, CBG 184.

## 2017-10-26 NOTE — ED Notes (Signed)
Pt returned from CT °

## 2017-10-26 NOTE — ED Notes (Signed)
Family now at the bedside and states pt fell last night at church also.

## 2017-10-27 ENCOUNTER — Emergency Department (HOSPITAL_COMMUNITY): Payer: Medicare HMO

## 2017-10-27 ENCOUNTER — Emergency Department (HOSPITAL_COMMUNITY): Payer: Medicare HMO | Admitting: Anesthesiology

## 2017-10-27 ENCOUNTER — Encounter (HOSPITAL_COMMUNITY): Payer: Self-pay | Admitting: Emergency Medicine

## 2017-10-27 ENCOUNTER — Inpatient Hospital Stay (HOSPITAL_COMMUNITY)
Admission: EM | Admit: 2017-10-27 | Discharge: 2017-11-11 | DRG: 025 | Disposition: E | Payer: Medicare HMO | Attending: Neurological Surgery | Admitting: Neurological Surgery

## 2017-10-27 ENCOUNTER — Other Ambulatory Visit: Payer: Self-pay

## 2017-10-27 ENCOUNTER — Inpatient Hospital Stay (HOSPITAL_COMMUNITY): Admission: EM | Disposition: E | Payer: Self-pay | Source: Home / Self Care | Attending: Neurological Surgery

## 2017-10-27 DIAGNOSIS — I482 Chronic atrial fibrillation: Secondary | ICD-10-CM | POA: Diagnosis not present

## 2017-10-27 DIAGNOSIS — S065XAA Traumatic subdural hemorrhage with loss of consciousness status unknown, initial encounter: Secondary | ICD-10-CM | POA: Diagnosis present

## 2017-10-27 DIAGNOSIS — S06350A Traumatic hemorrhage of left cerebrum without loss of consciousness, initial encounter: Secondary | ICD-10-CM | POA: Diagnosis present

## 2017-10-27 DIAGNOSIS — E1165 Type 2 diabetes mellitus with hyperglycemia: Secondary | ICD-10-CM | POA: Diagnosis present

## 2017-10-27 DIAGNOSIS — I4891 Unspecified atrial fibrillation: Secondary | ICD-10-CM | POA: Diagnosis present

## 2017-10-27 DIAGNOSIS — I959 Hypotension, unspecified: Secondary | ICD-10-CM | POA: Diagnosis present

## 2017-10-27 DIAGNOSIS — I10 Essential (primary) hypertension: Secondary | ICD-10-CM | POA: Diagnosis not present

## 2017-10-27 DIAGNOSIS — S022XXA Fracture of nasal bones, initial encounter for closed fracture: Secondary | ICD-10-CM | POA: Diagnosis present

## 2017-10-27 DIAGNOSIS — R509 Fever, unspecified: Secondary | ICD-10-CM | POA: Diagnosis not present

## 2017-10-27 DIAGNOSIS — E8770 Fluid overload, unspecified: Secondary | ICD-10-CM | POA: Diagnosis not present

## 2017-10-27 DIAGNOSIS — Z7901 Long term (current) use of anticoagulants: Secondary | ICD-10-CM | POA: Diagnosis not present

## 2017-10-27 DIAGNOSIS — Z515 Encounter for palliative care: Secondary | ICD-10-CM

## 2017-10-27 DIAGNOSIS — J9600 Acute respiratory failure, unspecified whether with hypoxia or hypercapnia: Secondary | ICD-10-CM | POA: Diagnosis not present

## 2017-10-27 DIAGNOSIS — Z01818 Encounter for other preprocedural examination: Secondary | ICD-10-CM | POA: Diagnosis not present

## 2017-10-27 DIAGNOSIS — G4733 Obstructive sleep apnea (adult) (pediatric): Secondary | ICD-10-CM | POA: Diagnosis present

## 2017-10-27 DIAGNOSIS — E870 Hyperosmolality and hypernatremia: Secondary | ICD-10-CM | POA: Diagnosis not present

## 2017-10-27 DIAGNOSIS — Y9222 Religious institution as the place of occurrence of the external cause: Secondary | ICD-10-CM | POA: Diagnosis not present

## 2017-10-27 DIAGNOSIS — J9690 Respiratory failure, unspecified, unspecified whether with hypoxia or hypercapnia: Secondary | ICD-10-CM

## 2017-10-27 DIAGNOSIS — R402432 Glasgow coma scale score 3-8, at arrival to emergency department: Secondary | ICD-10-CM | POA: Diagnosis present

## 2017-10-27 DIAGNOSIS — Z66 Do not resuscitate: Secondary | ICD-10-CM | POA: Diagnosis not present

## 2017-10-27 DIAGNOSIS — Z978 Presence of other specified devices: Secondary | ICD-10-CM | POA: Diagnosis not present

## 2017-10-27 DIAGNOSIS — Z4659 Encounter for fitting and adjustment of other gastrointestinal appliance and device: Secondary | ICD-10-CM

## 2017-10-27 DIAGNOSIS — Z0189 Encounter for other specified special examinations: Secondary | ICD-10-CM

## 2017-10-27 DIAGNOSIS — R569 Unspecified convulsions: Secondary | ICD-10-CM | POA: Diagnosis not present

## 2017-10-27 DIAGNOSIS — E119 Type 2 diabetes mellitus without complications: Secondary | ICD-10-CM

## 2017-10-27 DIAGNOSIS — Z9911 Dependence on respirator [ventilator] status: Secondary | ICD-10-CM | POA: Diagnosis not present

## 2017-10-27 DIAGNOSIS — S065X9A Traumatic subdural hemorrhage with loss of consciousness of unspecified duration, initial encounter: Secondary | ICD-10-CM | POA: Diagnosis not present

## 2017-10-27 DIAGNOSIS — Z6835 Body mass index (BMI) 35.0-35.9, adult: Secondary | ICD-10-CM | POA: Diagnosis not present

## 2017-10-27 DIAGNOSIS — Z7189 Other specified counseling: Secondary | ICD-10-CM

## 2017-10-27 DIAGNOSIS — E876 Hypokalemia: Secondary | ICD-10-CM | POA: Diagnosis not present

## 2017-10-27 DIAGNOSIS — W19XXXA Unspecified fall, initial encounter: Secondary | ICD-10-CM | POA: Diagnosis present

## 2017-10-27 DIAGNOSIS — Z9289 Personal history of other medical treatment: Secondary | ICD-10-CM

## 2017-10-27 DIAGNOSIS — S065X0A Traumatic subdural hemorrhage without loss of consciousness, initial encounter: Secondary | ICD-10-CM | POA: Diagnosis present

## 2017-10-27 DIAGNOSIS — Z7984 Long term (current) use of oral hypoglycemic drugs: Secondary | ICD-10-CM | POA: Diagnosis not present

## 2017-10-27 DIAGNOSIS — E785 Hyperlipidemia, unspecified: Secondary | ICD-10-CM | POA: Diagnosis present

## 2017-10-27 DIAGNOSIS — D72829 Elevated white blood cell count, unspecified: Secondary | ICD-10-CM | POA: Diagnosis not present

## 2017-10-27 HISTORY — PX: CRANIOTOMY: SHX93

## 2017-10-27 LAB — CBC
HEMATOCRIT: 40.7 % (ref 39.0–52.0)
Hemoglobin: 14.1 g/dL (ref 13.0–17.0)
MCH: 33.6 pg (ref 26.0–34.0)
MCHC: 34.6 g/dL (ref 30.0–36.0)
MCV: 96.9 fL (ref 78.0–100.0)
PLATELETS: 149 10*3/uL — AB (ref 150–400)
RBC: 4.2 MIL/uL — ABNORMAL LOW (ref 4.22–5.81)
RDW: 13.9 % (ref 11.5–15.5)
WBC: 14.9 10*3/uL — AB (ref 4.0–10.5)

## 2017-10-27 LAB — POCT I-STAT 4, (NA,K, GLUC, HGB,HCT)
GLUCOSE: 261 mg/dL — AB (ref 65–99)
HEMATOCRIT: 36 % — AB (ref 39.0–52.0)
HEMOGLOBIN: 12.2 g/dL — AB (ref 13.0–17.0)
Potassium: 4.2 mmol/L (ref 3.5–5.1)
Sodium: 142 mmol/L (ref 135–145)

## 2017-10-27 LAB — COMPREHENSIVE METABOLIC PANEL
ALT: 27 U/L (ref 17–63)
AST: 32 U/L (ref 15–41)
Albumin: 3.9 g/dL (ref 3.5–5.0)
Alkaline Phosphatase: 68 U/L (ref 38–126)
Anion gap: 14 (ref 5–15)
BUN: 22 mg/dL — AB (ref 6–20)
CHLORIDE: 101 mmol/L (ref 101–111)
CO2: 25 mmol/L (ref 22–32)
CREATININE: 1.1 mg/dL (ref 0.61–1.24)
Calcium: 9.1 mg/dL (ref 8.9–10.3)
GFR calc Af Amer: >60 mL/min (ref >60)
GFR calc non Af Amer: >60 mL/min (ref >60)
Glucose, Bld: 248 mg/dL — ABNORMAL HIGH (ref 65–99)
Potassium: 4.3 mmol/L (ref 3.5–5.1)
SODIUM: 140 mmol/L (ref 135–145)
TOTAL PROTEIN: 6.4 g/dL — AB (ref 6.5–8.1)
Total Bilirubin: 2.2 mg/dL — ABNORMAL HIGH (ref 0.3–1.2)

## 2017-10-27 LAB — I-STAT CHEM 8, ED
BUN: 25 mg/dL — ABNORMAL HIGH (ref 6–20)
CALCIUM ION: 1.15 mmol/L (ref 1.15–1.40)
Chloride: 99 mmol/L — ABNORMAL LOW (ref 101–111)
Creatinine, Ser: 0.9 mg/dL (ref 0.61–1.24)
Glucose, Bld: 248 mg/dL — ABNORMAL HIGH (ref 65–99)
HEMATOCRIT: 41 % (ref 39.0–52.0)
HEMOGLOBIN: 13.9 g/dL (ref 13.0–17.0)
Potassium: 4.3 mmol/L (ref 3.5–5.1)
SODIUM: 142 mmol/L (ref 135–145)
TCO2: 28 mmol/L (ref 22–32)

## 2017-10-27 LAB — GLUCOSE, CAPILLARY: Glucose-Capillary: 221 mg/dL — ABNORMAL HIGH (ref 65–99)

## 2017-10-27 LAB — POCT I-STAT 7, (LYTES, BLD GAS, ICA,H+H)
Acid-base deficit: 2 mmol/L (ref 0.0–2.0)
BICARBONATE: 26.7 mmol/L (ref 20.0–28.0)
CALCIUM ION: 1.12 mmol/L — AB (ref 1.15–1.40)
HCT: 36 % — ABNORMAL LOW (ref 39.0–52.0)
Hemoglobin: 12.2 g/dL — ABNORMAL LOW (ref 13.0–17.0)
O2 SAT: 100 %
PH ART: 7.269 — AB (ref 7.350–7.450)
Patient temperature: 35.8
Potassium: 4.3 mmol/L (ref 3.5–5.1)
SODIUM: 142 mmol/L (ref 135–145)
TCO2: 28 mmol/L (ref 22–32)
pCO2 arterial: 57.3 mmHg — ABNORMAL HIGH (ref 32.0–48.0)
pO2, Arterial: 421 mmHg — ABNORMAL HIGH (ref 83.0–108.0)

## 2017-10-27 LAB — I-STAT CG4 LACTIC ACID, ED: LACTIC ACID, VENOUS: 2.54 mmol/L — AB (ref 0.5–1.9)

## 2017-10-27 LAB — MRSA PCR SCREENING: MRSA by PCR: NEGATIVE

## 2017-10-27 LAB — BASIC METABOLIC PANEL
Anion gap: 14 (ref 5–15)
BUN: 25 mg/dL — ABNORMAL HIGH (ref 6–20)
CALCIUM: 9.5 mg/dL (ref 8.9–10.3)
CHLORIDE: 105 mmol/L (ref 101–111)
CO2: 22 mmol/L (ref 22–32)
CREATININE: 1.14 mg/dL (ref 0.61–1.24)
Glucose, Bld: 135 mg/dL — ABNORMAL HIGH (ref 65–99)
Potassium: 3.9 mmol/L (ref 3.5–5.1)
SODIUM: 141 mmol/L (ref 135–145)

## 2017-10-27 LAB — DIFFERENTIAL
BASOS PCT: 0 %
Basophils Absolute: 0 10*3/uL (ref 0.0–0.1)
Eosinophils Absolute: 0 10*3/uL (ref 0.0–0.7)
Eosinophils Relative: 0 %
Lymphocytes Relative: 7 %
Lymphs Abs: 1.1 10*3/uL (ref 0.7–4.0)
Monocytes Absolute: 0.7 10*3/uL (ref 0.1–1.0)
Monocytes Relative: 5 %
NEUTROS ABS: 13.1 10*3/uL — AB (ref 1.7–7.7)
Neutrophils Relative %: 88 %

## 2017-10-27 LAB — I-STAT TROPONIN, ED: TROPONIN I, POC: 0 ng/mL (ref 0.00–0.08)

## 2017-10-27 LAB — ETHANOL

## 2017-10-27 LAB — PROTIME-INR
INR: 2.68
INR: 1.36
Prothrombin Time: 28.3 seconds — ABNORMAL HIGH (ref 11.4–15.2)
Prothrombin Time: 16.7 seconds — ABNORMAL HIGH (ref 11.4–15.2)

## 2017-10-27 LAB — APTT: aPTT: 39 seconds — ABNORMAL HIGH (ref 24–36)

## 2017-10-27 SURGERY — CRANIOTOMY HEMATOMA EVACUATION SUBDURAL
Anesthesia: General | Site: Head | Laterality: Left

## 2017-10-27 MED ORDER — SODIUM CHLORIDE 0.9 % IV SOLN
INTRAVENOUS | Status: DC | PRN
  Administered 2017-10-27: 14:00:00 via INTRAVENOUS

## 2017-10-27 MED ORDER — ORAL CARE MOUTH RINSE
15.0000 mL | Freq: Four times a day (QID) | OROMUCOSAL | Status: DC
  Administered 2017-10-28 – 2017-10-29 (×6): 15 mL via OROMUCOSAL

## 2017-10-27 MED ORDER — ONDANSETRON HCL 4 MG/2ML IJ SOLN: 4.0000 mg | INTRAMUSCULAR | Status: DC | PRN

## 2017-10-27 MED ORDER — PHENYLEPHRINE HCL 10 MG/ML IJ SOLN
INTRAVENOUS | Status: DC | PRN
  Administered 2017-10-27: 60 ug/min via INTRAVENOUS

## 2017-10-27 MED ORDER — PANTOPRAZOLE SODIUM 40 MG IV SOLR
40.0000 mg | Freq: Every day | INTRAVENOUS | Status: DC
  Administered 2017-10-27 – 2017-11-02 (×7): 40 mg via INTRAVENOUS
  Filled 2017-10-27 (×7): qty 40

## 2017-10-27 MED ORDER — BISACODYL 5 MG PO TBEC: 5.0000 mg | DELAYED_RELEASE_TABLET | Freq: Every day | ORAL | Status: DC | PRN

## 2017-10-27 MED ORDER — PROPOFOL 10 MG/ML IV BOLUS
INTRAVENOUS | Status: DC | PRN
  Administered 2017-10-27: 50 mg via INTRAVENOUS

## 2017-10-27 MED ORDER — MAGNESIUM OXIDE 400 (241.3 MG) MG PO TABS
400.0000 mg | ORAL_TABLET | Freq: Every day | ORAL | Status: DC
  Filled 2017-10-27 (×2): qty 1

## 2017-10-27 MED ORDER — SODIUM CHLORIDE 0.9 % IR SOLN
Status: DC | PRN
  Administered 2017-10-27: 500 mL

## 2017-10-27 MED ORDER — BACITRACIN ZINC 500 UNIT/GM EX OINT
TOPICAL_OINTMENT | CUTANEOUS | Status: DC | PRN
  Administered 2017-10-27: 1 via TOPICAL

## 2017-10-27 MED ORDER — VANCOMYCIN HCL IN DEXTROSE 1-5 GM/200ML-% IV SOLN
INTRAVENOUS | Status: AC
  Filled 2017-10-27: qty 200

## 2017-10-27 MED ORDER — PROTHROMBIN COMPLEX CONC HUMAN 500 UNITS IV KIT
2232.0000 [IU] | PACK | Freq: Once | INTRAVENOUS | Status: AC
  Administered 2017-10-27: 2232 [IU] via INTRAVENOUS
  Filled 2017-10-27: qty 2232

## 2017-10-27 MED ORDER — METFORMIN HCL ER 500 MG PO TB24: 1000.0000 mg | ORAL_TABLET | Freq: Two times a day (BID) | ORAL | Status: DC

## 2017-10-27 MED ORDER — 0.9 % SODIUM CHLORIDE (POUR BTL) OPTIME
TOPICAL | Status: DC | PRN
Start: 2017-10-27 — End: 2017-10-27
  Administered 2017-10-27 (×3): 1000 mL

## 2017-10-27 MED ORDER — SODIUM CHLORIDE 0.9 % IJ SOLN
INTRAMUSCULAR | Status: AC
  Filled 2017-10-27: qty 20

## 2017-10-27 MED ORDER — HEMOSTATIC AGENTS (NO CHARGE) OPTIME
TOPICAL | Status: DC | PRN
  Administered 2017-10-27: 1 via TOPICAL

## 2017-10-27 MED ORDER — INSULIN ASPART 100 UNIT/ML ~~LOC~~ SOLN
2.0000 [IU] | SUBCUTANEOUS | Status: DC
  Administered 2017-10-27 – 2017-10-28 (×4): 6 [IU] via SUBCUTANEOUS
  Administered 2017-10-28: 4 [IU] via SUBCUTANEOUS
  Administered 2017-10-28: 6 [IU] via SUBCUTANEOUS
  Administered 2017-10-28 (×2): 4 [IU] via SUBCUTANEOUS

## 2017-10-27 MED ORDER — SODIUM CHLORIDE 0.9 % IV SOLN
1000.0000 mg | Freq: Once | INTRAVENOUS | Status: AC
  Administered 2017-10-27: 1000 mg via INTRAVENOUS
  Filled 2017-10-27: qty 10

## 2017-10-27 MED ORDER — CHLORHEXIDINE GLUCONATE 0.12% ORAL RINSE (MEDLINE KIT)
15.0000 mL | Freq: Two times a day (BID) | OROMUCOSAL | Status: DC
  Administered 2017-10-27 – 2017-11-03 (×13): 15 mL via OROMUCOSAL

## 2017-10-27 MED ORDER — FLEET ENEMA 7-19 GM/118ML RE ENEM: 1.0000 | ENEMA | Freq: Once | RECTAL | Status: DC | PRN

## 2017-10-27 MED ORDER — LINAGLIPTIN 5 MG PO TABS: 5.0000 mg | ORAL_TABLET | Freq: Every day | ORAL | Status: DC

## 2017-10-27 MED ORDER — MICROFIBRILLAR COLL HEMOSTAT EX POWD
CUTANEOUS | Status: DC | PRN
  Administered 2017-10-27: 1 g via TOPICAL

## 2017-10-27 MED ORDER — LOSARTAN POTASSIUM 50 MG PO TABS: 100.0000 mg | ORAL_TABLET | Freq: Every day | ORAL | Status: DC

## 2017-10-27 MED ORDER — FINASTERIDE 5 MG PO TABS
5.0000 mg | ORAL_TABLET | Freq: Every day | ORAL | Status: DC
  Administered 2017-10-28 – 2017-11-02 (×6): 5 mg via ORAL
  Filled 2017-10-27 (×7): qty 1

## 2017-10-27 MED ORDER — SENNA 8.6 MG PO TABS
1.0000 | ORAL_TABLET | Freq: Two times a day (BID) | ORAL | Status: DC
  Administered 2017-10-27 – 2017-11-02 (×11): 8.6 mg via ORAL
  Filled 2017-10-27 (×11): qty 1

## 2017-10-27 MED ORDER — MICROFIBRILLAR COLL HEMOSTAT EX POWD
CUTANEOUS | Status: AC
  Filled 2017-10-27: qty 5

## 2017-10-27 MED ORDER — PROTHROMBIN COMPLEX CONC HUMAN 500 UNITS IV KIT
1988.0000 [IU] | PACK | Freq: Once | Status: DC
  Filled 2017-10-27: qty 1988

## 2017-10-27 MED ORDER — PROPOFOL 10 MG/ML IV BOLUS
INTRAVENOUS | Status: AC
  Filled 2017-10-27: qty 20

## 2017-10-27 MED ORDER — CEFAZOLIN SODIUM-DEXTROSE 2-4 GM/100ML-% IV SOLN
2.0000 g | Freq: Three times a day (TID) | INTRAVENOUS | Status: DC
  Administered 2017-10-27 – 2017-11-03 (×20): 2 g via INTRAVENOUS
  Filled 2017-10-27 (×22): qty 100

## 2017-10-27 MED ORDER — THROMBIN (RECOMBINANT) 5000 UNITS EX SOLR
CUTANEOUS | Status: AC
  Filled 2017-10-27: qty 5000

## 2017-10-27 MED ORDER — FENTANYL CITRATE (PF) 100 MCG/2ML IJ SOLN
100.0000 ug | Freq: Once | INTRAMUSCULAR | Status: AC
  Administered 2017-10-27 (×3): 50 ug via INTRAVENOUS

## 2017-10-27 MED ORDER — BACITRACIN ZINC 500 UNIT/GM EX OINT
TOPICAL_OINTMENT | CUTANEOUS | Status: AC
  Filled 2017-10-27: qty 28.35

## 2017-10-27 MED ORDER — MAGNESIUM OXIDE -MG SUPPLEMENT 500 MG PO CAPS: 1.0000 | ORAL_CAPSULE | Freq: Every day | ORAL | Status: DC

## 2017-10-27 MED ORDER — NALOXONE HCL 0.4 MG/ML IJ SOLN: 0.0800 mg | INTRAMUSCULAR | Status: DC | PRN

## 2017-10-27 MED ORDER — HYDROMORPHONE HCL 1 MG/ML IJ SOLN: 0.5000 mg | INTRAMUSCULAR | Status: DC | PRN

## 2017-10-27 MED ORDER — SODIUM CHLORIDE 0.9 % IV SOLN
INTRAVENOUS | Status: DC
  Administered 2017-10-27 – 2017-10-31 (×9): via INTRAVENOUS

## 2017-10-27 MED ORDER — PHENYLEPHRINE 40 MCG/ML (10ML) SYRINGE FOR IV PUSH (FOR BLOOD PRESSURE SUPPORT)
PREFILLED_SYRINGE | INTRAVENOUS | Status: AC
  Filled 2017-10-27: qty 10

## 2017-10-27 MED ORDER — PROPOFOL 1000 MG/100ML IV EMUL
0.0000 ug/kg/min | INTRAVENOUS | Status: DC
  Administered 2017-10-27: 15 ug/kg/min via INTRAVENOUS
  Filled 2017-10-27: qty 100

## 2017-10-27 MED ORDER — BUPIVACAINE-EPINEPHRINE (PF) 0.5% -1:200000 IJ SOLN
INTRAMUSCULAR | Status: AC
  Filled 2017-10-27: qty 30

## 2017-10-27 MED ORDER — SODIUM CHLORIDE 0.9 % IV SOLN
500.0000 mg | Freq: Two times a day (BID) | INTRAVENOUS | Status: DC
  Administered 2017-10-27 – 2017-10-29 (×4): 500 mg via INTRAVENOUS
  Filled 2017-10-27 (×5): qty 5

## 2017-10-27 MED ORDER — DOCUSATE SODIUM 50 MG/5ML PO LIQD
100.0000 mg | Freq: Two times a day (BID) | ORAL | Status: DC
  Administered 2017-10-27 – 2017-11-02 (×11): 100 mg via ORAL
  Filled 2017-10-27 (×11): qty 10

## 2017-10-27 MED ORDER — NIACIN ER (ANTIHYPERLIPIDEMIC) 500 MG PO TBCR
500.0000 mg | EXTENDED_RELEASE_TABLET | Freq: Every day | ORAL | Status: DC
  Filled 2017-10-27: qty 1

## 2017-10-27 MED ORDER — HYDROCODONE-ACETAMINOPHEN 5-325 MG PO TABS: 1.0000 | ORAL_TABLET | ORAL | Status: DC | PRN

## 2017-10-27 MED ORDER — SILDENAFIL CITRATE 100 MG PO TABS: 100.0000 mg | ORAL_TABLET | ORAL | Status: DC | PRN

## 2017-10-27 MED ORDER — DOCUSATE SODIUM 100 MG PO CAPS: 100.0000 mg | ORAL_CAPSULE | Freq: Two times a day (BID) | ORAL | Status: DC

## 2017-10-27 MED ORDER — DEXMEDETOMIDINE HCL IN NACL 200 MCG/50ML IV SOLN: 0.0000 ug/kg/h | INTRAVENOUS | Status: DC

## 2017-10-27 MED ORDER — GELATIN ABSORBABLE MT POWD
OROMUCOSAL | Status: DC | PRN
  Administered 2017-10-27 (×2): 5 mL via TOPICAL

## 2017-10-27 MED ORDER — FENTANYL CITRATE (PF) 100 MCG/2ML IJ SOLN
INTRAMUSCULAR | Status: DC | PRN
  Administered 2017-10-27: 100 ug via INTRAVENOUS

## 2017-10-27 MED ORDER — ONDANSETRON HCL 4 MG/2ML IJ SOLN
INTRAMUSCULAR | Status: AC
Start: 2017-10-27 — End: 2017-10-27
  Filled 2017-10-27: qty 2

## 2017-10-27 MED ORDER — ROCURONIUM BROMIDE 100 MG/10ML IV SOLN
INTRAVENOUS | Status: DC | PRN
  Administered 2017-10-27 (×2): 50 mg via INTRAVENOUS

## 2017-10-27 MED ORDER — THROMBIN (RECOMBINANT) 20000 UNITS EX SOLR
CUTANEOUS | Status: AC
  Filled 2017-10-27: qty 20000

## 2017-10-27 MED ORDER — SURGIFOAM 100 EX MISC
CUTANEOUS | Status: DC | PRN
  Administered 2017-10-27: 20 mL via TOPICAL

## 2017-10-27 MED ORDER — VITAMIN K1 10 MG/ML IJ SOLN
10.0000 mg | Freq: Once | INTRAVENOUS | Status: AC
  Administered 2017-10-27: 10 mg via INTRAVENOUS
  Filled 2017-10-27: qty 1

## 2017-10-27 MED ORDER — GLIPIZIDE 10 MG PO TABS: 10.0000 mg | ORAL_TABLET | Freq: Every day | ORAL | Status: DC

## 2017-10-27 MED ORDER — ONDANSETRON HCL 4 MG PO TABS: 4.0000 mg | ORAL_TABLET | ORAL | Status: DC | PRN

## 2017-10-27 MED ORDER — TRIAMTERENE-HCTZ 37.5-25 MG PO CAPS: 1.0000 | ORAL_CAPSULE | Freq: Every morning | ORAL | Status: DC

## 2017-10-27 MED ORDER — ALBUMIN HUMAN 5 % IV SOLN
INTRAVENOUS | Status: DC | PRN
  Administered 2017-10-27: 16:00:00 via INTRAVENOUS

## 2017-10-27 MED ORDER — DEXMEDETOMIDINE HCL IN NACL 200 MCG/50ML IV SOLN
0.0000 ug/kg/h | INTRAVENOUS | Status: DC
  Filled 2017-10-27 (×2): qty 50

## 2017-10-27 MED ORDER — BUPIVACAINE-EPINEPHRINE (PF) 0.5% -1:200000 IJ SOLN
INTRAMUSCULAR | Status: DC | PRN
  Administered 2017-10-27: 15 mL

## 2017-10-27 MED ORDER — FENTANYL CITRATE (PF) 250 MCG/5ML IJ SOLN
INTRAMUSCULAR | Status: AC
  Filled 2017-10-27: qty 5

## 2017-10-27 MED ORDER — FENTANYL CITRATE (PF) 100 MCG/2ML IJ SOLN
INTRAMUSCULAR | Status: AC
  Filled 2017-10-27: qty 2

## 2017-10-27 MED ORDER — LIDOCAINE-EPINEPHRINE 2 %-1:100000 IJ SOLN
INTRAMUSCULAR | Status: AC
  Filled 2017-10-27: qty 1

## 2017-10-27 MED ORDER — ETOMIDATE 2 MG/ML IV SOLN
INTRAVENOUS | Status: AC | PRN
  Administered 2017-10-27: 20 mg via INTRAVENOUS

## 2017-10-27 MED ORDER — FENTANYL CITRATE 100 MCG BU TABS: 100.0000 ug | ORAL_TABLET | Freq: Once | BUCCAL | Status: DC

## 2017-10-27 MED ORDER — FENTANYL CITRATE (PF) 100 MCG/2ML IJ SOLN
50.0000 ug | INTRAMUSCULAR | Status: DC | PRN
  Administered 2017-10-28 – 2017-10-30 (×6): 50 ug via INTRAVENOUS
  Filled 2017-10-27 (×6): qty 2

## 2017-10-27 MED ORDER — HYDRALAZINE HCL 20 MG/ML IJ SOLN: 5.0000 mg | INTRAMUSCULAR | Status: DC | PRN

## 2017-10-27 MED ORDER — OMEGA-3-ACID ETHYL ESTERS 1 G PO CAPS: 2.0000 g | ORAL_CAPSULE | Freq: Two times a day (BID) | ORAL | Status: DC

## 2017-10-27 MED ORDER — VANCOMYCIN HCL 1000 MG IV SOLR
INTRAVENOUS | Status: DC | PRN
  Administered 2017-10-27: 1000 mg via INTRAVENOUS

## 2017-10-27 MED ORDER — VITAMIN K1 10 MG/ML IJ SOLN
10.0000 mg | Freq: Every day | INTRAVENOUS | Status: AC
  Administered 2017-10-28 – 2017-10-30 (×3): 10 mg via INTRAVENOUS
  Filled 2017-10-27 (×3): qty 1

## 2017-10-27 MED ORDER — LIDOCAINE-EPINEPHRINE 2 %-1:100000 IJ SOLN
INTRAMUSCULAR | Status: DC | PRN
  Administered 2017-10-27: 15 mL

## 2017-10-27 MED ORDER — SUCCINYLCHOLINE CHLORIDE 20 MG/ML IJ SOLN
INTRAMUSCULAR | Status: AC | PRN
  Administered 2017-10-27: 100 mg via INTRAVENOUS

## 2017-10-27 MED ORDER — PROMETHAZINE HCL 25 MG PO TABS: 12.5000 mg | ORAL_TABLET | ORAL | Status: DC | PRN

## 2017-10-27 MED ORDER — SODIUM CHLORIDE 0.9 % IV SOLN
500.0000 mg | Freq: Two times a day (BID) | INTRAVENOUS | Status: DC
  Filled 2017-10-27: qty 5

## 2017-10-27 MED ORDER — SODIUM CHLORIDE 0.9 % IV SOLN
0.2000 ug/kg/h | INTRAVENOUS | Status: DC
  Filled 2017-10-27: qty 2

## 2017-10-27 MED ORDER — BISOPROLOL FUMARATE 10 MG PO TABS: 10.0000 mg | ORAL_TABLET | Freq: Every day | ORAL | Status: DC

## 2017-10-27 MED ORDER — VITAMIN C 500 MG PO TABS
500.0000 mg | ORAL_TABLET | ORAL | Status: DC
  Administered 2017-10-28 – 2017-11-01 (×3): 500 mg via ORAL
  Filled 2017-10-27 (×3): qty 1

## 2017-10-27 MED ORDER — FENTANYL CITRATE (PF) 100 MCG/2ML IJ SOLN
50.0000 ug | INTRAMUSCULAR | Status: AC | PRN
  Administered 2017-10-29 – 2017-10-30 (×3): 50 ug via INTRAVENOUS
  Filled 2017-10-27 (×3): qty 2

## 2017-10-27 MED ORDER — METOPROLOL TARTRATE 50 MG PO TABS: 100.0000 mg | ORAL_TABLET | Freq: Two times a day (BID) | ORAL | Status: DC

## 2017-10-27 SURGICAL SUPPLY — 103 items
APL SKNCLS STERI-STRIP NONHPOA (GAUZE/BANDAGES/DRESSINGS)
BAG DECANTER FOR FLEXI CONT (MISCELLANEOUS) ×3 IMPLANT
BALL CTTN LRG ABS STRL LF (GAUZE/BANDAGES/DRESSINGS) ×3
BATTERY IQ STERILE (MISCELLANEOUS) ×2 IMPLANT
BENZOIN TINCTURE PRP APPL 2/3 (GAUZE/BANDAGES/DRESSINGS) IMPLANT
BLADE CLIPPER SURG (BLADE) ×3 IMPLANT
BLADE ULTRA TIP 2M (BLADE) ×3 IMPLANT
BNDG GAUZE ELAST 4 BULKY (GAUZE/BANDAGES/DRESSINGS) IMPLANT
BTRY SRG DRVR 1.5 IQ (MISCELLANEOUS) ×1
BUR ACORN 6.0 PRECISION (BURR) ×2 IMPLANT
BUR ACORN 6.0MM PRECISION (BURR) ×1
BUR MATCHSTICK NEURO 3.0 LAGG (BURR) IMPLANT
BUR SPIRAL ROUTER 2.3 (BUR) ×3 IMPLANT
BUR SPIRAL ROUTER 2.3MM (BUR) ×3
CANISTER SUCT 3000ML PPV (MISCELLANEOUS) ×3 IMPLANT
CARTRIDGE OIL MAESTRO DRILL (MISCELLANEOUS) ×1 IMPLANT
CATH ROBINSON RED A/P 14FR (CATHETERS) IMPLANT
CHLORAPREP W/TINT 26ML (MISCELLANEOUS) ×3 IMPLANT
CLIP RANEY DISP (INSTRUMENTS) ×4 IMPLANT
CLIP VESOCCLUDE MED 6/CT (CLIP) IMPLANT
COTTONBALL LRG STERILE PKG (GAUZE/BANDAGES/DRESSINGS) ×6 IMPLANT
DECANTER SPIKE VIAL GLASS SM (MISCELLANEOUS) ×6 IMPLANT
DIFFUSER DRILL AIR PNEUMATIC (MISCELLANEOUS) ×3 IMPLANT
DRAIN JACKSON PRATT 10MM FLAT (MISCELLANEOUS) ×2 IMPLANT
DRAPE NEUROLOGICAL W/INCISE (DRAPES) ×3 IMPLANT
DRAPE SHEET LG 3/4 BI-LAMINATE (DRAPES) ×6 IMPLANT
DRAPE SURG 17X23 STRL (DRAPES) IMPLANT
DRAPE WARM FLUID 44X44 (DRAPE) ×3 IMPLANT
DRSG MEPILEX BORDER 4X12 (GAUZE/BANDAGES/DRESSINGS) ×2 IMPLANT
DRSG MEPILEX BORDER 4X8 (GAUZE/BANDAGES/DRESSINGS) ×2 IMPLANT
DURAMATRIX ONLAY 3X3 (Plate) ×2 IMPLANT
ELECT REM PT RETURN 9FT ADLT (ELECTROSURGICAL) ×3
ELECTRODE REM PT RTRN 9FT ADLT (ELECTROSURGICAL) ×1 IMPLANT
EVACUATOR 1/8 PVC DRAIN (DRAIN) IMPLANT
EVACUATOR SILICONE 100CC (DRAIN) ×2 IMPLANT
GAUZE SPONGE 4X4 12PLY STRL (GAUZE/BANDAGES/DRESSINGS) ×3 IMPLANT
GAUZE SPONGE 4X4 16PLY XRAY LF (GAUZE/BANDAGES/DRESSINGS) IMPLANT
GLOVE BIO SURGEON STRL SZ7 (GLOVE) IMPLANT
GLOVE BIO SURGEON STRL SZ7.5 (GLOVE) ×6 IMPLANT
GLOVE BIOGEL PI IND STRL 6.5 (GLOVE) IMPLANT
GLOVE BIOGEL PI IND STRL 7.0 (GLOVE) IMPLANT
GLOVE BIOGEL PI IND STRL 7.5 (GLOVE) ×1 IMPLANT
GLOVE BIOGEL PI INDICATOR 6.5 (GLOVE) ×2
GLOVE BIOGEL PI INDICATOR 7.0 (GLOVE)
GLOVE BIOGEL PI INDICATOR 7.5 (GLOVE) ×6
GLOVE EXAM NITRILE LRG STRL (GLOVE) ×1 IMPLANT
GLOVE EXAM NITRILE XL STR (GLOVE) IMPLANT
GLOVE EXAM NITRILE XS STR PU (GLOVE) IMPLANT
GLOVE SS BIOGEL STRL SZ 7.5 (GLOVE) ×2 IMPLANT
GLOVE SUPERSENSE BIOGEL SZ 7.5 (GLOVE) ×10
GOWN STRL REUS W/ TWL LRG LVL3 (GOWN DISPOSABLE) ×1 IMPLANT
GOWN STRL REUS W/ TWL XL LVL3 (GOWN DISPOSABLE) IMPLANT
GOWN STRL REUS W/TWL 2XL LVL3 (GOWN DISPOSABLE) IMPLANT
GOWN STRL REUS W/TWL LRG LVL3 (GOWN DISPOSABLE) ×9
GOWN STRL REUS W/TWL XL LVL3 (GOWN DISPOSABLE)
HEMOSTAT POWDER KIT SURGIFOAM (HEMOSTASIS) ×3 IMPLANT
HEMOSTAT SURGICEL 2X14 (HEMOSTASIS) ×2 IMPLANT
KIT BASIN OR (CUSTOM PROCEDURE TRAY) ×3 IMPLANT
KIT ROOM TURNOVER OR (KITS) ×3 IMPLANT
MARKER SKIN DUAL TIP RULER LAB (MISCELLANEOUS) ×4 IMPLANT
NDL HYPO 21X1.5 SAFETY (NEEDLE) ×1 IMPLANT
NDL HYPO 25X1 1.5 SAFETY (NEEDLE) ×1 IMPLANT
NEEDLE HYPO 21X1.5 SAFETY (NEEDLE) ×3 IMPLANT
NEEDLE HYPO 25X1 1.5 SAFETY (NEEDLE) ×3 IMPLANT
NS IRRIG 1000ML POUR BTL (IV SOLUTION) ×6 IMPLANT
OIL CARTRIDGE MAESTRO DRILL (MISCELLANEOUS) ×3
PACK CRANIOTOMY (CUSTOM PROCEDURE TRAY) ×1 IMPLANT
PACK LAMINECTOMY NEURO (CUSTOM PROCEDURE TRAY) ×2 IMPLANT
PATTIES SURGICAL .5 X.5 (GAUZE/BANDAGES/DRESSINGS) IMPLANT
PATTIES SURGICAL .5 X3 (DISPOSABLE) ×2 IMPLANT
PATTIES SURGICAL .5X1.5 (GAUZE/BANDAGES/DRESSINGS) IMPLANT
PATTIES SURGICAL 1X1 (DISPOSABLE) IMPLANT
PERFORATOR LRG  14-11MM (BIT) ×2
PERFORATOR LRG 14-11MM (BIT) IMPLANT
PIN MAYFIELD SKULL DISP (PIN) ×3 IMPLANT
PLATE 1.5  2HOLE LNG NEURO (Plate) ×8 IMPLANT
PLATE 1.5 2HOLE LNG NEURO (Plate) IMPLANT
SCREW SELF DRILL HT 1.5/4MM (Screw) ×16 IMPLANT
SPONGE LAP 18X18 X RAY DECT (DISPOSABLE) ×1 IMPLANT
SPONGE NEURO XRAY DETECT 1X3 (DISPOSABLE) ×2 IMPLANT
SPONGE SURGIFOAM ABS GEL 100 (HEMOSTASIS) ×3 IMPLANT
SPONGE SURGIFOAM ABS GEL 100C (HEMOSTASIS) ×1 IMPLANT
STAPLER VISISTAT 35W (STAPLE) ×3 IMPLANT
STOCKINETTE 6  STRL (DRAPES)
STOCKINETTE 6 STRL (DRAPES) ×1 IMPLANT
STRIP SURGICAL 1 X 6 IN (GAUZE/BANDAGES/DRESSINGS) IMPLANT
STRIP SURGICAL 1/2 X 6 IN (GAUZE/BANDAGES/DRESSINGS) IMPLANT
STRIP SURGICAL 1/4 X 6 IN (GAUZE/BANDAGES/DRESSINGS) IMPLANT
STRIP SURGICAL 3/4 X 6 IN (GAUZE/BANDAGES/DRESSINGS) IMPLANT
SUT ETHILON 3 0 FSL (SUTURE) IMPLANT
SUT ETHILON 3 0 PS 1 (SUTURE) IMPLANT
SUT NURALON 4 0 TR CR/8 (SUTURE) ×6 IMPLANT
SUT VIC AB 0 CT1 18XCR BRD8 (SUTURE) ×2 IMPLANT
SUT VIC AB 0 CT1 8-18 (SUTURE) ×6
SUT VIC AB 2-0 CT1 18 (SUTURE) ×14 IMPLANT
SYR 30ML LL (SYRINGE) ×6 IMPLANT
TOWEL GREEN STERILE (TOWEL DISPOSABLE) ×3 IMPLANT
TOWEL GREEN STERILE FF (TOWEL DISPOSABLE) ×3 IMPLANT
TRAY FOLEY W/METER SILVER 16FR (SET/KITS/TRAYS/PACK) ×3 IMPLANT
TUBE CONNECTING 12'X1/4 (SUCTIONS) ×1
TUBE CONNECTING 12X1/4 (SUCTIONS) ×2 IMPLANT
UNDERPAD 30X30 (UNDERPADS AND DIAPERS) ×1 IMPLANT
WATER STERILE IRR 1000ML POUR (IV SOLUTION) ×3 IMPLANT

## 2017-10-27 NOTE — Code Documentation (Signed)
Campos MD intubating.

## 2017-10-27 NOTE — Consult Note (Signed)
PULMONARY / CRITICAL CARE MEDICINE   Name: [name] MRN: [id number] DOB: 1941-04-28    ADMISSION DATE:  10/16/2017 CONSULTATION DATE:  1/17  REFERRING MD:  Ditty   CHIEF COMPLAINT:  Vent management   HISTORY OF PRESENT ILLNESS:   77 year old male with history of DM, hypertension, A. fib on Coumadin, hyperlipidemia presented 1/17 to the ER as code stroke after he was found unresponsive.  Of note he had a fall 1/16 at which time he came to the ER and had negative head CT.  He was discharged home.  Woke up on the morning of admission with a headache and went to bed, was then found unresponsive.  Was found to have large left subdural hematoma with midline shift. KCentra given.  Seen in consultation by neurosurgery and taken urgently to the OR for evacuation of SDH.  He remained intubated postop and critical care consulted for vent and medical management.  PAST MEDICAL HISTORY :  He  has a past medical history of Diabetes mellitus, Diverticulosis, Hyperlipidemia, Hypertension, and Type 2 diabetes mellitus ([place]).  PAST SURGICAL HISTORY: He  has a past surgical history that includes Achilles tendon surgery and Tonsillectomy.  No Known Allergies  No current facility-administered medications on file prior to encounter.    Current Outpatient Medications on File Prior to Encounter  Medication Sig  . ACCU-CHEK SMARTVIEW test strip USE TO TEST BLOOD SUGAR EVERY DAY  . Alcohol Swabs (B-D SINGLE USE SWABS REGULAR) PADS Use one swab as directed  . Ascorbic Acid (VITAMIN C) 500 MG tablet Take 500 mg by mouth every other day.    . bisoprolol (ZEBETA) 10 MG tablet Take 10 mg by mouth daily.  . Blood Glucose Monitoring Suppl (ACCU-CHEK NANO SMARTVIEW) W/DEVICE KIT Check blood sugar daily. Dx Code: 250.00  . Efinaconazole 10 % SOLN Apply daily to the affected are for 28 days (Patient taking differently: Apply 1 application topically daily as needed (toenails). Apply daily to the affected are  for 28 days)  . finasteride (PROSCAR) 5 MG tablet Take 1 tablet (5 mg total) by mouth daily.  Marland Kitchen glipiZIDE (GLUCOTROL) 5 MG tablet Take 1 tablet (5 mg total) by mouth daily before breakfast. (Patient taking differently: Take 10 mg by mouth daily before breakfast. )  . Lancet Devices (AUTO-LANCET) MISC Test blood sugar daily. Dx Code: 250.00. Accu-Chek nano smart view device  . losartan (COZAAR) 100 MG tablet Take 1 tablet (100 mg total) by mouth daily.  . Magnesium Oxide 500 MG CAPS Take 1 capsule by mouth daily.  . metFORMIN (GLUCOPHAGE-XR) 500 MG 24 hr tablet TAKE 2 TABLETS TWICE DAILY  . metoprolol tartrate (LOPRESSOR) 100 MG tablet Take 100 mg by mouth 2 (two) times daily.  . naproxen sodium (ALEVE) 220 MG tablet Take 220 mg by mouth 2 (two) times daily.  . niacin (NIASPAN) 500 MG CR tablet Take 1 tablet (500 mg total) by mouth at bedtime.  . Omega-3 Fatty Acids (FISH OIL) 1200 MG CAPS Take 2 capsules by mouth 2 (two) times daily.    . sildenafil (VIAGRA) 100 MG tablet Take 1 tablet (100 mg total) by mouth as needed for erectile dysfunction.  . sitaGLIPtin (JANUVIA) 100 MG tablet Take 100 mg by mouth every evening.  . triamterene-hydrochlorothiazide (DYAZIDE) 37.5-25 MG per capsule Take 1 each (1 capsule total) by mouth every morning.  . warfarin (COUMADIN) 5 MG tablet Take 5-7.5 mg by mouth daily. Alternating days    FAMILY HISTORY:  His indicated  that his mother is deceased. He indicated that his father is deceased. He indicated that his brother is alive. He indicated that the status of his other is unknown.   SOCIAL HISTORY: He  reports that he quit smoking about 33 years ago. He has a 7.50 pack-year smoking history. He does not have any smokeless tobacco history on file. He reports that he drinks about 7.0 oz of alcohol per week. He reports that he does not use drugs.  REVIEW OF SYSTEMS:   Unable.  As per HPI above obtained from staff and records  SUBJECTIVE:    VITAL SIGNS: BP  (!) 174/86   Pulse 84   Temp 98.7 F (37.1 C) (Temporal)   Resp 15   SpO2 98%   HEMODYNAMICS:    VENTILATOR SETTINGS: Vent Mode: PRVC FiO2 (%):  [100 %] 100 % Set Rate:  [15 bmp] 15 bmp Vt Set:  [567 mL-570 mL] 567 mL PEEP:  [5 cmH20] 5 cmH20 Plateau Pressure:  [15 cmH20-20 cmH20] 15 cmH20  INTAKE / OUTPUT: No intake/output data recorded.  PHYSICAL EXAMINATION: General:  Overweight elderly male on vent Neuro:  Sedated HEENT:  [place]/AT, PERRL, no JVD Cardiovascular:  IRIR, regular rhythm. No MRG Lungs:  Clear bilateral breath sounds Abdomen: Soft, non-tender, non-distended Musculoskeletal: No acute deformity.  Skin:  Grossly intact  LABS:  BMET Recent Labs  Lab 10/26/17 2323 11/02/2017 1228 10/17/2017 1253  NA 141 140 142  K 3.9 4.3 4.3  CL 105 101 99*  CO2 22 25  --   BUN 25* 22* 25*  CREATININE 1.14 1.10 0.90  GLUCOSE 135* 248* 248*    Electrolytes Recent Labs  Lab 10/26/17 2323 10/23/2017 1228  CALCIUM 9.5 9.1    CBC Recent Labs  Lab 10/26/17 2148 11/06/2017 1228 10/22/2017 1253  WBC  --  14.9*  --   HGB 16.0 14.1 13.9  HCT 47.0 40.7 41.0  PLT  --  149*  --     Coag's Recent Labs  Lab 10/29/2017 1300  APTT 39*  INR 2.68    Sepsis Markers Recent Labs  Lab 10/20/2017 1255  LATICACIDVEN 2.54*    ABG No results for input(s): PHART, PCO2ART, PO2ART in the last 168 hours.  Liver Enzymes Recent Labs  Lab 10/25/2017 1228  AST 32  ALT 27  ALKPHOS 68  BILITOT 2.2*  ALBUMIN 3.9    Cardiac Enzymes No results for input(s): TROPONINI, PROBNP in the last 168 hours.  Glucose No results for input(s): GLUCAP in the last 168 hours.  Imaging Ct Head Wo Contrast  Result Date: 10/26/2017 CLINICAL DATA:  Fall with facial trauma. Patient on anti coagulation therapy for atrial fibrillation. EXAM: CT HEAD WITHOUT CONTRAST CT MAXILLOFACIAL WITHOUT CONTRAST TECHNIQUE: Multidetector CT imaging of the head and maxillofacial structures were performed using  the standard protocol without intravenous contrast. Multiplanar CT image reconstructions of the maxillofacial structures were also generated. COMPARISON:  Brain MRI 02/15/2017 FINDINGS: CT HEAD FINDINGS Brain: No mass lesion, intraparenchymal hemorrhage or extra-axial collection. No evidence of acute cortical infarct. There is periventricular hypoattenuation compatible with chronic microvascular disease. Vascular: No hyperdense vessel or unexpected calcification. Skull: No calvarial fracture. Normal skull base. CT MAXILLOFACIAL FINDINGS Osseous: --Complex facial fracture types: No LeFort, zygomaticomaxillary complex or nasoorbitoethmoidal fracture. --Simple fracture types: Minimally displaced anterior nasal bone fracture. --Mandible, hard palate and teeth: No acute abnormality. Orbits: The globes are intact. Normal appearance of the intra- and extraconal fat. Symmetric extraocular muscles. Sinuses: No fluid levels  or advanced mucosal thickening. Mild right maxillary sinus mucosal thickening. Soft tissues: Abrasion of the bridge of the nose. IMPRESSION: 1. No acute intracranial abnormality. 2. Minimally depressed fracture of the anterior nasal bone with associated soft tissue abrasion. No other facial fracture or skull fracture. Electronically Signed   By: Ulyses Jarred M.D.   On: 10/26/2017 21:37   Dg Chest Port 1 View  Result Date: 10/23/2017 CLINICAL DATA:  Hypoxia EXAM: PORTABLE CHEST 1 VIEW COMPARISON:  March 18, 2015 FINDINGS: Endotracheal tube tip is 5.1 cm above the carina. Nasogastric tube tip and side port are below the diaphragm. No pneumothorax. There is a small left pleural effusion with mild left base atelectasis. Lungs elsewhere clear. Heart is upper normal in size with pulmonary vascularity within normal limits. There is aortic atherosclerosis. No adenopathy. No bone lesions. IMPRESSION: Tube positions as described without pneumothorax. Small left effusion with mild left base atelectasis. Lungs  elsewhere clear. Stable cardiac silhouette. There is aortic atherosclerosis. Aortic Atherosclerosis (ICD10-I70.0). Electronically Signed   By: Lowella Grip III M.D.   On: 11/01/2017 13:08   Ct Head Code Stroke Wo Contrast  Result Date: 11/09/2017 CLINICAL DATA:  Code stroke.  Altered mental status.  Fall. EXAM: CT HEAD WITHOUT CONTRAST TECHNIQUE: Contiguous axial images were obtained from the base of the skull through the vertex without intravenous contrast. COMPARISON:  10/26/2017 FINDINGS: Brain: There is a large, predominantly hyperdense subdural hematoma diffusely over the left cerebral convexity which measures up to approximately 3 cm in thickness. There is prominent mass effect on the left cerebral hemisphere with diffuse sulcal effacement, partial effacement of the lateral and third ventricles, and 2.6 cm of rightward midline shift. There is new mild dilatation of the atrium and occipital and temporal horns of the right lateral ventricle. A small amount of subdural blood extends along the falx and left tentorium. There is also a new 3 cm parenchymal hemorrhage in the left temporal lobe with surrounding vasogenic edema. There is left uncal herniation, mass effect on the midbrain with effacement of the ambient cistern, and effacement of the suprasellar cistern. No acute large territory vascular arterial infarct is identified. Vascular: Calcified atherosclerosis at the skull base. Skull: No fracture or focal osseous lesion. Sinuses/Orbits: Scattered mild paranasal sinus mucosal thickening. Clear mastoid air cells. Bilateral cataract extraction. Other: None. ASPECTS [facility] Stroke Program Early CT Score) Not scored due to the presence of hemorrhage. IMPRESSION: 1. Large subdural hematoma over the left cerebral convexity. Prominent mass effect with uncal herniation, 2.6 cm of rightward midline shift, and trapping of the right lateral ventricle. 2. 3 cm hemorrhagic contusion in the left temporal lobe with  surrounding edema. Critical Value/emergent results were called by telephone at the time of interpretation on 11/08/2017 at 12:57 pm to Dr. Jola Schmidt , who verbally acknowledged these results. Electronically Signed   By: Logan Bores M.D.   On: 10/23/2017 12:58   Ct Maxillofacial Wo Contrast  Result Date: 10/26/2017 CLINICAL DATA:  Fall with facial trauma. Patient on anti coagulation therapy for atrial fibrillation. EXAM: CT HEAD WITHOUT CONTRAST CT MAXILLOFACIAL WITHOUT CONTRAST TECHNIQUE: Multidetector CT imaging of the head and maxillofacial structures were performed using the standard protocol without intravenous contrast. Multiplanar CT image reconstructions of the maxillofacial structures were also generated. COMPARISON:  Brain MRI 02/15/2017 FINDINGS: CT HEAD FINDINGS Brain: No mass lesion, intraparenchymal hemorrhage or extra-axial collection. No evidence of acute cortical infarct. There is periventricular hypoattenuation compatible with chronic microvascular disease. Vascular: No hyperdense vessel  or unexpected calcification. Skull: No calvarial fracture. Normal skull base. CT MAXILLOFACIAL FINDINGS Osseous: --Complex facial fracture types: No LeFort, zygomaticomaxillary complex or nasoorbitoethmoidal fracture. --Simple fracture types: Minimally displaced anterior nasal bone fracture. --Mandible, hard palate and teeth: No acute abnormality. Orbits: The globes are intact. Normal appearance of the intra- and extraconal fat. Symmetric extraocular muscles. Sinuses: No fluid levels or advanced mucosal thickening. Mild right maxillary sinus mucosal thickening. Soft tissues: Abrasion of the bridge of the nose. IMPRESSION: 1. No acute intracranial abnormality. 2. Minimally depressed fracture of the anterior nasal bone with associated soft tissue abrasion. No other facial fracture or skull fracture. Electronically Signed   By: Ulyses Jarred M.D.   On: 10/26/2017 21:37     STUDIES:  Ct head 1/17>>>1. Large  subdural hematoma over the left cerebral convexity. Prominent mass effect with uncal herniation, 2.6 cm of rightward midline shift, and trapping of the right lateral ventricle. 2. 3 cm hemorrhagic contusion in the left temporal lobe with surrounding edema.  CULTURES: None  ANTIBIOTICS: Ancef 1/17 > peri-op   SIGNIFICANT EVENTS: 1/17 OR>>> crani, evacuation of L SDH   LINES/TUBES: ETT 1/17 >>  DISCUSSION: 77 year old male with large SDH in setting fall on Coumadin.  s/P OR for craniotomy and evacuation of subdural hematoma by neurosurgery.  Remains vented postop.  ASSESSMENT / PLAN:  PULMONARY Acute respiratory failure- remains on vent post neurosurgery for evacuation of subdural hematoma P:   Vent support - 8cc/kg  F/u CXR  F/u ABG Wakeup assessment and SBT in a.m. if okay with Neurosurgery.   CARDIOVASCULAR A. fib on Coumadin H/o HTN P:  Hold all anticoagulation Hold home bisoprolol, losartan, metoprolol, Dyazide Phenylephrine off, so DC PRN metoprolol to keep SBP < 164mHg per neurosurgery  RENAL No active issue P:   Follow-up chemistries  GASTROINTESTINAL No active issue P:   N.p.o. for now PPI Consider tube feed in a.m. if not extubated  HEMATOLOGIC Chronic anticoagulation s/P Kcentra Large L SDH after fall P:  No anticoagulation for now SCDs Follow-up CBC Follow-up coags   INFECTIOUS No active issue P:   Periop ABX Monitor WBC/fever curve  ENDOCRINE DM P:   Sliding scale insulin  NEUROLOGIC Large left subdural hematoma with midline shift in setting fall on Coumadin- status post OR for evacuation P:   RASS goal: -1 Wakeup assessment when okay with neuro ICU PAD order set for sedation (Propofol, PRN fent) Neurosurgery primary.   FAMILY  - Updates: Family updated 1/17  - Inter-disciplinary family meet or Palliative Care meeting due by:  1/24  PGeorgann Housekeeper AGACNP-BC LMennoPulmonology/Critical Care Pager [phone] ([phone] 10/30/2017 4:59 PM

## 2017-10-27 NOTE — Code Documentation (Signed)
100 Mcg of fentyal given.

## 2017-10-27 NOTE — Code Documentation (Signed)
77 year old male presented to [facility] via [place] as code stroke.  Family reports patient fell yesterday was seen in ED last pm - no acute head  injury - nasal fracture - returnted home.  This AM at 0700 wife reports patient was awake and alert - ate breakfast- complained of headache - went back to bed.  He was then found unresponsive at 1100 - EMS was called - code stroke called in field.  On arrival to ED patient met at bridge by EDP , ED staff and stroke team -  patient unresponsive - breathing on own - bloody nasal and oral drainage noted - oral airway in- place.  Family reports patient on Coumadin- GCS 3 - taken urgently to CT scan with EDP - stroke team and ED RN - tol scan - large subdural hematoma with shift noted - patient quickly returned to ED - urgent intubation by EDP without problem - Kcentra and Vitamin K given per order.  NIHSS difficult to obtain - see flowsheet.  GCS remains 3.  Cough and gag present.  Family in updated by Dr. Venora Maples and Dr. Cyndy Freeze - urgent transfer to OR for surgery - handoff to anesthesia team Sharrie Rothman - CRNA.

## 2017-10-27 NOTE — ED Notes (Signed)
Neuro surgery at bedside.

## 2017-10-27 NOTE — ED Notes (Signed)
Daughter at bedside.

## 2017-10-27 NOTE — ED Provider Notes (Addendum)
[name] Provider Note   CSN: [id number] Arrival date & time: 11/02/2017  1225     History   Chief Complaint Chief Complaint  Patient presents with  . Code Stroke   Level V Caveat: unresponsive  HPI [name] is a 78 y.o. male.  HPI 77 yo male who sustained a fall yesterday while at church. On coumadin for afib. Seen in ER last night. Ct head and CT maxilofacial performed. Nasal fracture noted, otherwise no intracranial bleed noted. This morning reported some HA to family but otherwise stated he slept well. Went back to bed and was found unresponsive in his bed. EMS reports blown left pupil and unresponsive. No vomit noted by family. Pt unresponsive and unable to provide any information. GCS 3   Past Medical History:  Diagnosis Date  . Diabetes mellitus   . Diverticulosis   . Hyperlipidemia   . Hypertension   . Type 2 diabetes mellitus [facility])     Patient Active Problem List   Diagnosis Date Noted  . OBSTRUCTIVE SLEEP APNEA 05/28/2010  . PSA, INCREASED 11/30/2009  . LOW BACK PAIN SYNDROME 08/02/2008  . NECK SPRAIN AND STRAIN 08/02/2008  . POLYP, COLON 01/18/2007  . DIABETES MELLITUS, TYPE II 01/18/2007  . HYPERLIPIDEMIA 01/18/2007  . HYPERTENSION 01/18/2007  . DIVERTICULOSIS, COLON 01/18/2007    Past Surgical History:  Procedure Laterality Date  . ACHILLES TENDON SURGERY     L  . TONSILLECTOMY         Home Medications    Prior to Admission medications   Medication Sig Start Date End Date Taking? Authorizing Provider  ACCU-CHEK SMARTVIEW test strip USE TO TEST BLOOD SUGAR EVERY DAY 12/04/14   Roma Schanz R, DO  Alcohol Swabs (B-D SINGLE USE SWABS REGULAR) PADS Use one swab as directed 10/24/13   Carollee Herter, Alferd Apa, DO  Ascorbic Acid (VITAMIN C) 500 MG tablet Take 500 mg by mouth every other day.      [provider]  bisoprolol (ZEBETA) 10 MG tablet Take 10 mg by mouth daily.    [provider]  Blood Glucose Monitoring Suppl (ACCU-CHEK NANO SMARTVIEW) W/DEVICE KIT Check blood sugar daily. Dx Code: 250.00 11/16/13   Carollee Herter, Alferd Apa, DO  Efinaconazole 10 % SOLN Apply daily to the affected are for 28 days Patient taking differently: Apply 1 application topically daily as needed (toenails). Apply daily to the affected are for 28 days 12/18/13   Carollee Herter, Alferd Apa, DO  finasteride (PROSCAR) 5 MG tablet Take 1 tablet (5 mg total) by mouth daily. 09/17/14   Ann Held, DO  glipiZIDE (GLUCOTROL) 5 MG tablet Take 1 tablet (5 mg total) by mouth daily before breakfast. Patient taking differently: Take 10 mg by mouth daily before breakfast.  05/02/14   Carollee Herter, Alferd Apa, DO  Lancet Devices (AUTO-LANCET) MISC Test blood sugar daily. Dx Code: 250.00. Accu-Chek nano smart view device 11/16/13   Carollee Herter, Kendrick Fries R, DO  losartan (COZAAR) 100 MG tablet Take 1 tablet (100 mg total) by mouth daily. 04/15/15   Ann Held, DO  Magnesium Oxide 500 MG CAPS Take 1 capsule by mouth daily.    [provider]  metFORMIN (GLUCOPHAGE-XR) 500 MG 24 hr tablet TAKE 2 TABLETS TWICE DAILY 12/24/14   Carollee Herter, Alferd Apa, DO  metoprolol tartrate (LOPRESSOR) 100 MG tablet Take 100 mg by mouth 2 (two) times daily.  [provider]  naproxen sodium (ALEVE) 220 MG tablet Take 220 mg by mouth 2 (two) times daily.    [provider]  niacin (NIASPAN) 500 MG CR tablet Take 1 tablet (500 mg total) by mouth at bedtime. 12/30/11 11/15/13  Ann Held, DO  Omega-3 Fatty Acids (FISH OIL) 1200 MG CAPS Take 2 capsules by mouth 2 (two) times daily.      [provider]  sildenafil (VIAGRA) 100 MG tablet Take 1 tablet (100 mg total) by mouth as needed for erectile dysfunction. 08/29/12   Ann Held, DO  sitaGLIPtin (JANUVIA) 100 MG tablet Take 100 mg by mouth every evening.    [provider]  triamterene-hydrochlorothiazide  (DYAZIDE) 37.5-25 MG per capsule Take 1 each (1 capsule total) by mouth every morning. 04/15/15   Ann Held, DO  warfarin (COUMADIN) 5 MG tablet Take 5-7.5 mg by mouth daily. Alternating days    [provider]    Family History Family History  Problem Relation Age of Onset  . Cancer Father        pancreatic  . Heart disease Mother   . Cancer Mother   . Cancer Other        cervical    Social History Social History   Tobacco Use  . Smoking status: Former Smoker    Packs/day: 0.30    Years: 25.00    Pack years: 7.50    Last attempt to quit: 10/11/1984    Years since quitting: 33.0  Substance Use Topics  . Alcohol use: Yes    Alcohol/week: 7.0 oz    Types: 14 Standard drinks or equivalent per week  . Drug use: No     Allergies   Patient has no known allergies.   Review of Systems Review of Systems  Unable to perform ROS: Patient unresponsive     Physical Exam Updated Vital Signs BP (!) 182/120   Pulse (!) 121   Resp (!) 100   SpO2 100%   Physical Exam  Constitutional: He appears well-developed and well-nourished.  HENT:  Head: Normocephalic.  Mild swelling of nose. Otherwise atraumativ  Neck: Neck supple.  Cardiovascular: Intact distal pulses.  Irregularly irregular. tachy  Pulmonary/Chest: Effort normal and breath sounds normal. No respiratory distress.  Abdominal: Soft. He exhibits no distension.  Musculoskeletal: He exhibits no edema or deformity.  Neurological:  gcs 3  Skin: Skin is warm and dry.  Psychiatric:  Unable to test  Nursing note and vitals reviewed.    ED Treatments / Results  Labs (all labs ordered are listed, but only abnormal results are displayed) Labs Reviewed  CBC - Abnormal; Notable for the following components:      Result Value   WBC 14.9 (*)    RBC 4.20 (*)    Platelets 149 (*)    All other components within normal limits  DIFFERENTIAL - Abnormal; Notable for the following components:   Neutro Abs  13.1 (*)    All other components within normal limits  I-STAT CHEM 8, ED - Abnormal; Notable for the following components:   Chloride 99 (*)    BUN 25 (*)    Glucose, Bld 248 (*)    All other components within normal limits  I-STAT CG4 LACTIC ACID, ED - Abnormal; Notable for the following components:   Lactic Acid, Venous 2.54 (*)    All other components within normal limits  ETHANOL  COMPREHENSIVE METABOLIC PANEL  RAPID URINE  DRUG SCREEN, HOSP PERFORMED  URINALYSIS, ROUTINE W REFLEX MICROSCOPIC  APTT  PROTIME-INR  I-STAT TROPONIN, ED    EKG  EKG Interpretation  Date/Time:  Thursday October 27 2017 12:52:10 EST Ventricular Rate:  115 PR Interval:    QRS Duration: 81 QT Interval:  351 QTC Calculation: 486 R Axis:   44 Text Interpretation:  Atrial fibrillation RSR' in V1 or V2, right VCD or RVH Nonspecific repol abnormality, lateral leads No old tracing to compare Confirmed by Jola Schmidt [id number]) on 11/04/2017 12:59:59 PM       Radiology Ct Head Wo Contrast  Result Date: 10/26/2017 CLINICAL DATA:  Fall with facial trauma. Patient on anti coagulation therapy for atrial fibrillation. EXAM: CT HEAD WITHOUT CONTRAST CT MAXILLOFACIAL WITHOUT CONTRAST TECHNIQUE: Multidetector CT imaging of the head and maxillofacial structures were performed using the standard protocol without intravenous contrast. Multiplanar CT image reconstructions of the maxillofacial structures were also generated. COMPARISON:  Brain MRI 02/15/2017 FINDINGS: CT HEAD FINDINGS Brain: No mass lesion, intraparenchymal hemorrhage or extra-axial collection. No evidence of acute cortical infarct. There is periventricular hypoattenuation compatible with chronic microvascular disease. Vascular: No hyperdense vessel or unexpected calcification. Skull: No calvarial fracture. Normal skull base. CT MAXILLOFACIAL FINDINGS Osseous: --Complex facial fracture types: No LeFort, zygomaticomaxillary complex or nasoorbitoethmoidal  fracture. --Simple fracture types: Minimally displaced anterior nasal bone fracture. --Mandible, hard palate and teeth: No acute abnormality. Orbits: The globes are intact. Normal appearance of the intra- and extraconal fat. Symmetric extraocular muscles. Sinuses: No fluid levels or advanced mucosal thickening. Mild right maxillary sinus mucosal thickening. Soft tissues: Abrasion of the bridge of the nose. IMPRESSION: 1. No acute intracranial abnormality. 2. Minimally depressed fracture of the anterior nasal bone with associated soft tissue abrasion. No other facial fracture or skull fracture. Electronically Signed   By: Ulyses Jarred M.D.   On: 10/26/2017 21:37   Ct Maxillofacial Wo Contrast  Result Date: 10/26/2017 CLINICAL DATA:  Fall with facial trauma. Patient on anti coagulation therapy for atrial fibrillation. EXAM: CT HEAD WITHOUT CONTRAST CT MAXILLOFACIAL WITHOUT CONTRAST TECHNIQUE: Multidetector CT imaging of the head and maxillofacial structures were performed using the standard protocol without intravenous contrast. Multiplanar CT image reconstructions of the maxillofacial structures were also generated. COMPARISON:  Brain MRI 02/15/2017 FINDINGS: CT HEAD FINDINGS Brain: No mass lesion, intraparenchymal hemorrhage or extra-axial collection. No evidence of acute cortical infarct. There is periventricular hypoattenuation compatible with chronic microvascular disease. Vascular: No hyperdense vessel or unexpected calcification. Skull: No calvarial fracture. Normal skull base. CT MAXILLOFACIAL FINDINGS Osseous: --Complex facial fracture types: No LeFort, zygomaticomaxillary complex or nasoorbitoethmoidal fracture. --Simple fracture types: Minimally displaced anterior nasal bone fracture. --Mandible, hard palate and teeth: No acute abnormality. Orbits: The globes are intact. Normal appearance of the intra- and extraconal fat. Symmetric extraocular muscles. Sinuses: No fluid levels or advanced mucosal  thickening. Mild right maxillary sinus mucosal thickening. Soft tissues: Abrasion of the bridge of the nose. IMPRESSION: 1. No acute intracranial abnormality. 2. Minimally depressed fracture of the anterior nasal bone with associated soft tissue abrasion. No other facial fracture or skull fracture. Electronically Signed   By: Ulyses Jarred M.D.   On: 10/26/2017 21:37    Procedures .Critical Care Performed by: Jola Schmidt, MD Authorized by: Jola Schmidt, MD     CRITICAL CARE Performed by: Jola Schmidt Total critical care time: 40 minutes Critical care time was exclusive of separately billable procedures and treating other patients. Critical care was necessary to treat or prevent imminent or life-threatening  deterioration. Critical care was time spent personally by me on the following activities: development of treatment plan with patient and/or surrogate as well as nursing, discussions with consultants, evaluation of patient's response to treatment, examination of patient, obtaining history from patient or surrogate, ordering and performing treatments and interventions, ordering and review of laboratory studies, ordering and review of radiographic studies, pulse oximetry and re-evaluation of patient's condition.  INTUBATION Performed by: Jola Schmidt Required items: required blood products, implants, devices, and special equipment available Patient identity confirmed: provided demographic data and hospital-assigned identification number Time out: Immediately prior to procedure a "time out" was called to verify the correct patient, procedure, equipment, support staff and site/side marked as required. 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 Chest x-ray findings: endotracheal tube in appropriate position Patient tolerated the procedure well with no immediate complications.    Angiocath insertion Performed by: Jola Schmidt Consent: Verbal consent obtained. Risks and benefits: risks, benefits and alternatives were discussed Time out: Immediately prior to procedure a "time out" was called to verify the correct patient, procedure, equipment, support staff and site/side marked as required. Preparation: Patient was prepped and draped in the usual sterile fashion. Vein Location: left AC Ultrasound Guided Gauge: 20 Normal blood return and flush without difficulty Patient tolerance: Patient tolerated the procedure well with no immediate complications.    Angiocath insertion Performed by: Jola Schmidt Consent: Verbal consent obtained. Risks and benefits: risks, benefits and alternatives were discussed Time out: Immediately prior to procedure a "time out" was called to verify the correct patient, procedure, equipment, support staff and site/side marked as required. Preparation: Patient was prepped and draped in the usual sterile fashion. Vein Location: left external jugular vein Gauge: 18 Normal blood return and flush without difficulty Patient tolerance: Patient tolerated the procedure well with no immediate complications.       Medications Ordered in ED Medications  phytonadione (VITAMIN K) 10 mg in dextrose 5 % 50 mL IVPB (10 mg Intravenous New Bag/Given 10/25/2017 1319)  fentaNYL (SUBLIMAZE) injection 100 mcg (not administered)  fentaNYL (SUBLIMAZE) 100 MCG/2ML injection (not administered)  prothrombin complex conc human (KCENTRA) IVPB 2,232 Units (2,232 Units Intravenous New Bag/Given 10/31/2017 1300)  fentaNYL (SUBLIMAZE) injection (100 mcg Intravenous Given 11/08/2017 1251)  levETIRAcetam (KEPPRA) 500 mg in sodium chloride 0.9 % 100  mL IVPB (not administered)  levETIRAcetam (KEPPRA) 1,000 mg in sodium chloride 0.9 % 100 mL IVPB (not administered)  etomidate (AMIDATE) injection (20 mg Intravenous Given 11/07/2017 1242)  succinylcholine (ANECTINE) injection (100 mg Intravenous Given 10/13/2017 1243)     Initial Impression / Assessment and Plan / ED Course  I have reviewed the triage vital signs and the nursing notes.  Pertinent labs & imaging results that were available during my care of the patient were reviewed by me and considered in my medical decision making (see chart for details).    Family updated. Stat Kcentra and vitamin K. Large left subdural with midline shift. Will need NSU evacuation. Gravity of the situation is understood by all family members. Dr Cyndy Freeze and his team evaluating at the bedside for operative management.    Addendum on 10/29/17 - intubation note added   Final Clinical Impressions(s) / ED Diagnoses   Final diagnoses:  Subdural  hematoma [facility])    ED Discharge Orders    None       Jola Schmidt, MD 11/02/2017 [place], MD 10/29/17 0040

## 2017-10-27 NOTE — Progress Notes (Signed)
Responded to page to support family at bedside. Per daughter patient fell broke nose and became unresponsive. Patient intubated and going to O.R..  Escorted family to waiting area.  Chaplain available as needed    11/09/2017 1316  Clinical Encounter Type  Visited With Patient;Family;Patient and family together;Health care provider  Visit Type Initial;Spiritual support;ED;Trauma  Referral From Nurse  Spiritual Encounters  Spiritual Needs Emotional  Stress Factors  Family Stress Factors Health changes;Major life changes  Cristopher Peru, [facility], Pager [phone] .

## 2017-10-27 NOTE — Consult Note (Signed)
Chief Complaint   Chief Complaint  Patient presents with  . Code Stroke    HPI   HPI: ZALMAN HULL is a 77 y.o. male brought to ER as code stroke after found unresponsive. Fall yesterday evening. Came to ER with negative head CT. Discharged home. This am woke up with headache. Wife states went to bed because fatigued. Was snoring which is not uncommon. She went to wake him up at 1000am and was unresponsive so called EMS. Currently intubated. On coumadin for Afib.  Patient Active Problem List   Diagnosis Date Noted  . OBSTRUCTIVE SLEEP APNEA 05/28/2010  . PSA, INCREASED 11/30/2009  . LOW BACK PAIN SYNDROME 08/02/2008  . NECK SPRAIN AND STRAIN 08/02/2008  . POLYP, COLON 01/18/2007  . DIABETES MELLITUS, TYPE II 01/18/2007  . HYPERLIPIDEMIA 01/18/2007  . HYPERTENSION 01/18/2007  . DIVERTICULOSIS, COLON 01/18/2007    PMH: Past Medical History:  Diagnosis Date  . Diabetes mellitus   . Diverticulosis   . Hyperlipidemia   . Hypertension   . Type 2 diabetes mellitus (HCC)     PSH: Past Surgical History:  Procedure Laterality Date  . ACHILLES TENDON SURGERY     L  . TONSILLECTOMY       (Not in a hospital admission)  SH: Social History   Tobacco Use  . Smoking status: Former Smoker    Packs/day: 0.30    Years: 25.00    Pack years: 7.50    Last attempt to quit: 10/11/1984    Years since quitting: 33.0  Substance Use Topics  . Alcohol use: Yes    Alcohol/week: 7.0 oz    Types: 14 Standard drinks or equivalent per week  . Drug use: No    MEDS: Prior to Admission medications   Medication Sig Start Date End Date Taking? Authorizing Provider  ACCU-CHEK SMARTVIEW test strip USE TO TEST BLOOD SUGAR EVERY DAY 12/04/14   Roma Schanz R, DO  Alcohol Swabs (B-D SINGLE USE SWABS REGULAR) PADS Use one swab as directed 10/24/13   Carollee Herter, Alferd Apa, DO  Ascorbic Acid (VITAMIN C) 500 MG tablet Take 500 mg by mouth every other day.      [provider]   bisoprolol (ZEBETA) 10 MG tablet Take 10 mg by mouth daily.    [provider]  Blood Glucose Monitoring Suppl (ACCU-CHEK NANO SMARTVIEW) W/DEVICE KIT Check blood sugar daily. Dx Code: 250.00 11/16/13   Carollee Herter, Alferd Apa, DO  Efinaconazole 10 % SOLN Apply daily to the affected are for 28 days Patient taking differently: Apply 1 application topically daily as needed (toenails). Apply daily to the affected are for 28 days 12/18/13   Carollee Herter, Alferd Apa, DO  finasteride (PROSCAR) 5 MG tablet Take 1 tablet (5 mg total) by mouth daily. 09/17/14   Ann Held, DO  glipiZIDE (GLUCOTROL) 5 MG tablet Take 1 tablet (5 mg total) by mouth daily before breakfast. Patient taking differently: Take 10 mg by mouth daily before breakfast.  05/02/14   Carollee Herter, Alferd Apa, DO  Lancet Devices (AUTO-LANCET) MISC Test blood sugar daily. Dx Code: 250.00. Accu-Chek nano smart view device 11/16/13   Carollee Herter, Kendrick Fries R, DO  losartan (COZAAR) 100 MG tablet Take 1 tablet (100 mg total) by mouth daily. 04/15/15   Ann Held, DO  Magnesium Oxide 500 MG CAPS Take 1 capsule by mouth daily.    [provider]  metFORMIN (GLUCOPHAGE-XR) 500 MG 24 hr tablet  TAKE 2 TABLETS TWICE DAILY 12/24/14   Carollee Herter, Alferd Apa, DO  metoprolol tartrate (LOPRESSOR) 100 MG tablet Take 100 mg by mouth 2 (two) times daily.    [provider]  naproxen sodium (ALEVE) 220 MG tablet Take 220 mg by mouth 2 (two) times daily.    [provider]  niacin (NIASPAN) 500 MG CR tablet Take 1 tablet (500 mg total) by mouth at bedtime. 12/30/11 11/15/13  Ann Held, DO  Omega-3 Fatty Acids (FISH OIL) 1200 MG CAPS Take 2 capsules by mouth 2 (two) times daily.      [provider]  sildenafil (VIAGRA) 100 MG tablet Take 1 tablet (100 mg total) by mouth as needed for erectile dysfunction. 08/29/12   Ann Held, DO  sitaGLIPtin (JANUVIA) 100 MG tablet Take 100 mg by mouth every  evening.    [provider]  triamterene-hydrochlorothiazide (DYAZIDE) 37.5-25 MG per capsule Take 1 each (1 capsule total) by mouth every morning. 04/15/15   Ann Held, DO  warfarin (COUMADIN) 5 MG tablet Take 5-7.5 mg by mouth daily. Alternating days    [provider]    ALLERGY: No Known Allergies  Social History   Tobacco Use  . Smoking status: Former Smoker    Packs/day: 0.30    Years: 25.00    Pack years: 7.50    Last attempt to quit: 10/11/1984    Years since quitting: 33.0  Substance Use Topics  . Alcohol use: Yes    Alcohol/week: 7.0 oz    Types: 14 Standard drinks or equivalent per week     Family History  Problem Relation Age of Onset  . Cancer Father        pancreatic  . Heart disease Mother   . Cancer Mother   . Cancer Other        cervical     ROS   ROS unable to obtain secondary to intubation  Exam   Vitals:   10/22/2017 1250 10/23/2017 1300  BP:  (!) 137/127  Pulse:  (!) 107  Resp:  14  Temp: 98.7 F (37.1 C)   SpO2:  100%    Intubated Pupils small but sluggishly reactive Flexes right lower extremities to painful stimulus. No LLE movement. No UE movement to painful stimulus +gag  Results - Imaging/Labs   Results for orders placed or performed during the hospital encounter of 10/25/2017 (from the past 48 hour(s))  CBC     Status: Abnormal   Collection Time: 10/15/2017 12:28 PM  Result Value Ref Range   WBC 14.9 (H) 4.0 - 10.5 K/uL   RBC 4.20 (L) 4.22 - 5.81 MIL/uL   Hemoglobin 14.1 13.0 - 17.0 g/dL   HCT 40.7 39.0 - 52.0 %   MCV 96.9 78.0 - 100.0 fL   MCH 33.6 26.0 - 34.0 pg   MCHC 34.6 30.0 - 36.0 g/dL   RDW 13.9 11.5 - 15.5 %   Platelets 149 (L) 150 - 400 K/uL  Differential     Status: Abnormal   Collection Time: 11/04/2017 12:28 PM  Result Value Ref Range   Neutrophils Relative % 88 %   Neutro Abs 13.1 (H) 1.7 - 7.7 K/uL   Lymphocytes Relative 7 %   Lymphs Abs 1.1 0.7 - 4.0 K/uL   Monocytes Relative 5 %    Monocytes Absolute 0.7 0.1 - 1.0 K/uL   Eosinophils Relative 0 %   Eosinophils Absolute 0.0 0.0 - 0.7  K/uL   Basophils Relative 0 %   Basophils Absolute 0.0 0.0 - 0.1 K/uL  I-stat troponin, ED     Status: None   Collection Time: 10/23/2017 12:52 PM  Result Value Ref Range   Troponin i, poc 0.00 0.00 - 0.08 ng/mL   Comment 3            Comment: Due to the release kinetics of cTnI, a negative result within the first hours of the onset of symptoms does not rule out myocardial infarction with certainty. If myocardial infarction is still suspected, repeat the test at appropriate intervals.   I-Stat Chem 8, ED     Status: Abnormal   Collection Time: 11/09/2017 12:53 PM  Result Value Ref Range   Sodium 142 135 - 145 mmol/L   Potassium 4.3 3.5 - 5.1 mmol/L   Chloride 99 (L) 101 - 111 mmol/L   BUN 25 (H) 6 - 20 mg/dL   Creatinine, Ser 0.90 0.61 - 1.24 mg/dL   Glucose, Bld 248 (H) 65 - 99 mg/dL   Calcium, Ion 1.15 1.15 - 1.40 mmol/L   TCO2 28 22 - 32 mmol/L   Hemoglobin 13.9 13.0 - 17.0 g/dL   HCT 41.0 39.0 - 52.0 %  I-Stat CG4 Lactic Acid, ED     Status: Abnormal   Collection Time: 10/12/2017 12:55 PM  Result Value Ref Range   Lactic Acid, Venous 2.54 (HH) 0.5 - 1.9 mmol/L   Comment NOTIFIED PHYSICIAN     Ct Head Wo Contrast  Result Date: 10/26/2017 CLINICAL DATA:  Fall with facial trauma. Patient on anti coagulation therapy for atrial fibrillation. EXAM: CT HEAD WITHOUT CONTRAST CT MAXILLOFACIAL WITHOUT CONTRAST TECHNIQUE: Multidetector CT imaging of the head and maxillofacial structures were performed using the standard protocol without intravenous contrast. Multiplanar CT image reconstructions of the maxillofacial structures were also generated. COMPARISON:  Brain MRI 02/15/2017 FINDINGS: CT HEAD FINDINGS Brain: No mass lesion, intraparenchymal hemorrhage or extra-axial collection. No evidence of acute cortical infarct. There is periventricular hypoattenuation compatible with chronic  microvascular disease. Vascular: No hyperdense vessel or unexpected calcification. Skull: No calvarial fracture. Normal skull base. CT MAXILLOFACIAL FINDINGS Osseous: --Complex facial fracture types: No LeFort, zygomaticomaxillary complex or nasoorbitoethmoidal fracture. --Simple fracture types: Minimally displaced anterior nasal bone fracture. --Mandible, hard palate and teeth: No acute abnormality. Orbits: The globes are intact. Normal appearance of the intra- and extraconal fat. Symmetric extraocular muscles. Sinuses: No fluid levels or advanced mucosal thickening. Mild right maxillary sinus mucosal thickening. Soft tissues: Abrasion of the bridge of the nose. IMPRESSION: 1. No acute intracranial abnormality. 2. Minimally depressed fracture of the anterior nasal bone with associated soft tissue abrasion. No other facial fracture or skull fracture. Electronically Signed   By: Ulyses Jarred M.D.   On: 10/26/2017 21:37   Ct Head Code Stroke Wo Contrast  Result Date: 10/31/2017 CLINICAL DATA:  Code stroke.  Altered mental status.  Fall. EXAM: CT HEAD WITHOUT CONTRAST TECHNIQUE: Contiguous axial images were obtained from the base of the skull through the vertex without intravenous contrast. COMPARISON:  10/26/2017 FINDINGS: Brain: There is a large, predominantly hyperdense subdural hematoma diffusely over the left cerebral convexity which measures up to approximately 3 cm in thickness. There is prominent mass effect on the left cerebral hemisphere with diffuse sulcal effacement, partial effacement of the lateral and third ventricles, and 2.6 cm of rightward midline shift. There is new mild dilatation of the atrium and occipital and temporal horns of the right lateral ventricle.  A small amount of subdural blood extends along the falx and left tentorium. There is also a new 3 cm parenchymal hemorrhage in the left temporal lobe with surrounding vasogenic edema. There is left uncal herniation, mass effect on the  midbrain with effacement of the ambient cistern, and effacement of the suprasellar cistern. No acute large territory vascular arterial infarct is identified. Vascular: Calcified atherosclerosis at the skull base. Skull: No fracture or focal osseous lesion. Sinuses/Orbits: Scattered mild paranasal sinus mucosal thickening. Clear mastoid air cells. Bilateral cataract extraction. Other: None. ASPECTS [facility] Stroke Program Early CT Score) Not scored due to the presence of hemorrhage. IMPRESSION: 1. Large subdural hematoma over the left cerebral convexity. Prominent mass effect with uncal herniation, 2.6 cm of rightward midline shift, and trapping of the right lateral ventricle. 2. 3 cm hemorrhagic contusion in the left temporal lobe with surrounding edema. Critical Value/emergent results were called by telephone at the time of interpretation on 10/21/2017 at 12:57 pm to Dr. Jola Schmidt , who verbally acknowledged these results. Electronically Signed   By: Logan Bores M.D.   On: 10/25/2017 12:58   Ct Maxillofacial Wo Contrast  Result Date: 10/26/2017 CLINICAL DATA:  Fall with facial trauma. Patient on anti coagulation therapy for atrial fibrillation. EXAM: CT HEAD WITHOUT CONTRAST CT MAXILLOFACIAL WITHOUT CONTRAST TECHNIQUE: Multidetector CT imaging of the head and maxillofacial structures were performed using the standard protocol without intravenous contrast. Multiplanar CT image reconstructions of the maxillofacial structures were also generated. COMPARISON:  Brain MRI 02/15/2017 FINDINGS: CT HEAD FINDINGS Brain: No mass lesion, intraparenchymal hemorrhage or extra-axial collection. No evidence of acute cortical infarct. There is periventricular hypoattenuation compatible with chronic microvascular disease. Vascular: No hyperdense vessel or unexpected calcification. Skull: No calvarial fracture. Normal skull base. CT MAXILLOFACIAL FINDINGS Osseous: --Complex facial fracture types: No LeFort, zygomaticomaxillary  complex or nasoorbitoethmoidal fracture. --Simple fracture types: Minimally displaced anterior nasal bone fracture. --Mandible, hard palate and teeth: No acute abnormality. Orbits: The globes are intact. Normal appearance of the intra- and extraconal fat. Symmetric extraocular muscles. Sinuses: No fluid levels or advanced mucosal thickening. Mild right maxillary sinus mucosal thickening. Soft tissues: Abrasion of the bridge of the nose. IMPRESSION: 1. No acute intracranial abnormality. 2. Minimally depressed fracture of the anterior nasal bone with associated soft tissue abrasion. No other facial fracture or skull fracture. Electronically Signed   By: Ulyses Jarred M.D.   On: 10/26/2017 21:37    Impression/Plan    77 y.o. male with large SDH. Neuro exam poor. Pt intubated currently. Will need to undergo emergent craniotomy for evacuation of SDH. Risks, benefits and alternatives discussed with wife and daughter who state understanding. They wish to proceed. Pre op orders entered. OR called to place case. Attending Dr Cyndy Freeze to see patient and discuss further with family and have consent signed. - Keppra 1g loading dose; 545m BID for seizure prophylaxis - K centra for coumadin reversal - Admit to ICU post op - Will need PCCM to admit

## 2017-10-27 NOTE — ED Notes (Signed)
Neurosurgery speak with family

## 2017-10-27 NOTE — Anesthesia Procedure Notes (Signed)
Arterial Line Insertion Start/End01/26/2019 2:00 PM, 11/09/2017 2:15 PM Performed by: Belinda Block, MD, anesthesiologist  Patient location: OR. Preanesthetic checklist: IV checked, surgical consent, monitors and equipment checked and timeout performed Left, radial was placed Hand hygiene performed   Attempts: 1 Procedure performed without using ultrasound guided technique. Following insertion, dressing applied and Biopatch. Patient tolerated the procedure well with no immediate complications.

## 2017-10-27 NOTE — Anesthesia Preprocedure Evaluation (Addendum)
Anesthesia Evaluation  Patient identified by MRN, date of birth, ID band Patient unresponsive    Reviewed: Patient's Chart, lab work & pertinent test results, Unable to perform ROS - Chart review only  Airway Mallampati: Intubated       Dental   Pulmonary former smoker,    breath sounds clear to auscultation       Cardiovascular hypertension,  Rhythm:Irregular Rate:Tachycardia     Neuro/Psych    GI/Hepatic   Endo/Other  diabetes  Renal/GU      Musculoskeletal   Abdominal   Peds  Hematology   Anesthesia Other Findings   Reproductive/Obstetrics                             Anesthesia Physical Anesthesia Plan  ASA: IV  Anesthesia Plan: General   Post-op Pain Management:    Induction: Intravenous  PONV Risk Score and Plan: Treatment may vary due to age or medical condition  Airway Management Planned: Oral ETT  Additional Equipment: Arterial line  Intra-op Plan:   Post-operative Plan: Post-operative intubation/ventilation  Informed Consent: I have reviewed the patients History and Physical, chart, labs and discussed the procedure including the risks, benefits and alternatives for the proposed anesthesia with the patient or authorized representative who has indicated his/her understanding and acceptance.     Plan Discussed with: CRNA, Anesthesiologist and Surgeon  Anesthesia Plan Comments:        Anesthesia Quick Evaluation

## 2017-10-27 NOTE — Progress Notes (Signed)
Patient arrived on unit at 1542, but was unable to access chart until 1652.

## 2017-10-27 NOTE — ED Triage Notes (Signed)
Patient presents to the ED for Code Stroke. Per EMS patient was seen yesterday for fall last night CT was negative and sent home. Patient was seen at 0700 Alert  at baseline. Patient went back to bed and when family seen patient at 47 patient  was unresponsive. EMS placed oral airway NonREb.Vitals 140/100 HR 100 17 RR 100% on Non Reb

## 2017-10-27 NOTE — ED Notes (Signed)
Family at bedside, warm blanket placed.

## 2017-10-27 NOTE — Progress Notes (Signed)
PCCM INTERVAL PROGRESS NOTE  Hypotensive on propofol. Turned off and remains borderline hypotensive. Will leave this off for now. Will place orders for precedex, however, have RN agrees that we will leave continuous sedation OFF until required to meet RASS goal. Patient currently remains obtunded.   Georgann Housekeeper, AGACNP-BC [facility] Pulmonology/Critical Care Pager [phone] or [phone]  10/18/2017 6:25 PM

## 2017-10-27 NOTE — ED Notes (Signed)
PAGED NEUROSURGERY PER MD CAMPOS

## 2017-10-27 NOTE — ED Notes (Signed)
MD Campos ultrasound IV

## 2017-10-27 NOTE — Discharge Instructions (Signed)
Avoid frequent biting into food items as this may cause repeat bleeding.  We recommend that you swish with warm water after each meal to make sure particles do not get stuck in your wound; however, do not do this vigorously as this may also cause repeat bleeding. Take Tylenol as needed for pain.  Continue your daily medications as prescribed.  You may apply topical ice to limit swelling 3-4 times per day, as desired. Your blood work today was reassuring.  We recommend that you have your wound rechecked by your primary care doctor.  You may return for new or concerning symptoms.

## 2017-10-27 NOTE — Code Documentation (Signed)
26 at the top tooth, bilateral breath sounds Easy cap change.

## 2017-10-27 NOTE — Transfer of Care (Signed)
Immediate Anesthesia Transfer of Care Note  Patient: [name]  Procedure(s) Performed: CRANIOTOMY HEMATOMA EVACUATION SUBDURAL (Left Head)  Patient Location: ICU  Anesthesia Type:General  Level of Consciousness: Patient remains intubated per anesthesia plan  Airway & Oxygen Therapy: Patient remains intubated per anesthesia plan and Patient placed on Ventilator (see vital sign flow sheet for setting)  Post-op Assessment: Report given to RN and Post -op Vital signs reviewed and stable  Post vital signs: Reviewed and stable  Last Vitals:  Vitals:   10/15/2017 1330 11/10/2017 1538  BP: (!) 147/96 (!) 174/86  Pulse: 94 84  Resp: 15 15  Temp:    SpO2:  98%    Last Pain:  Vitals:   10/21/2017 1250  TempSrc: Temporal         Complications: No apparent anesthesia complications

## 2017-10-27 NOTE — Op Note (Signed)
10/16/2017  3:34 PM  PATIENT:  [name]  77 y.o. male  PRE-OPERATIVE DIAGNOSIS:  Left frontotemporoparietal subdural hematoma; left temporal intraparenchymal hematoma; comatose  POST-OPERATIVE DIAGNOSIS:  Same  PROCEDURE:  Left frontotemporoparietal craniotomy for evacuation of subdural hematoma and left temporal intraparenchymal hematoma  SURGEON:  Aldean Ast, MD  ASSISTANTS: Ferne Reus, PA-C  ANESTHESIA:   General  DRAINS: JP drain in the subdural space   SPECIMEN:  None  INDICATION FOR PROCEDURE: 77 year old man on coumadin presents with delayed subdural hematoma and intraparenchymal hemorrhage.  I recommended emergency surgery.  The patient's family understood the risks, benefits, and alternatives and potential outcomes and wished to proceed.  PROCEDURE DETAILS: After smooth induction of general endotracheal anesthesia the patient was fixated to the bed in [place] pins with his head turned to the right. The left frontotemporoparietal area was clipped of hair. It was wiped down with alcohol. Lidocaine and Marcaine with epinephrine was injected along the area of the planned incision. The patient was prepped and draped in the usual sterile fashion.  A curvilinear incision was made. The galea was sharply incised. Temporalis fascia was incised. Monopolar cautery was taken through the temporalis muscle to the skull. A musculocutaneous flap was elevated and reflected anteriorly. A burr hole was made in the keyhole, the inferior temporal area, the posterior aspect of the exposure, and the anterior inferior aspect exposure. The dura was separated from the inner table skull using blunt dissection. Using a footplate and sidecutting bur a craniotomy was fashioned by connecting the bur holes.  The bone flap was further separated from the dura and was removed from the operative field and put on the table.   The dura was opened sharply and reflected. There was dark  hematoma under pressure. This was irrigated away. I irrigated under the reflected dural edges. The brain was depressed with a malleable brain retractor and further hematoma was aspirated. Small cortical bleeders were stopped with topical hemostatic agent. I was satisfied that there was complete subdural hematoma evacuation.  I turned my attention to the left temporal contusion.  I irrigated the intraparenchymal hematoma away.  I identified small bleeding vessels at the depths of the contusion cavity.  These were controlled with gentle bipolar coagulation.  I applied Gelfoam and cotton balls.  I allowed these to take effect before gently irrigating them away.  There was hemostasis within the intraparenchymal hematoma cavity.  I irrigated with bacitracin saline.  I placed surgicel around the contused cortex and contusion cavity.  I then place some avatene as well.  A subdural JP drain was placed and tunneled out the skin.  It was secured with a vicryl suture.  The dura was closed with running Vicryl suture and onlay of DuraMatrix. The bone flap was secured to the skull with titanium fixation plates. We irrigated again.  The wound was closed in routine anatomic layers using absorbable sutures. The skin was closed with staples. A sterile dressing was applied.  The patient was taken out of [place] pins.   PATIENT DISPOSITION:  ICU - intubated and hemodynamically stable.   Delay start of Pharmacological VTE agent (>24hrs) due to surgical blood loss or risk of bleeding:  yes

## 2017-10-28 ENCOUNTER — Inpatient Hospital Stay (HOSPITAL_COMMUNITY): Payer: Medicare HMO

## 2017-10-28 ENCOUNTER — Encounter (HOSPITAL_COMMUNITY): Payer: Self-pay | Admitting: Certified Registered Nurse Anesthetist

## 2017-10-28 ENCOUNTER — Other Ambulatory Visit: Payer: Self-pay

## 2017-10-28 DIAGNOSIS — I482 Chronic atrial fibrillation: Secondary | ICD-10-CM

## 2017-10-28 DIAGNOSIS — I4891 Unspecified atrial fibrillation: Secondary | ICD-10-CM | POA: Diagnosis present

## 2017-10-28 DIAGNOSIS — Z978 Presence of other specified devices: Secondary | ICD-10-CM | POA: Diagnosis present

## 2017-10-28 DIAGNOSIS — I10 Essential (primary) hypertension: Secondary | ICD-10-CM

## 2017-10-28 LAB — GLUCOSE, CAPILLARY
GLUCOSE-CAPILLARY: 192 mg/dL — AB (ref 65–99)
GLUCOSE-CAPILLARY: 298 mg/dL — AB (ref 65–99)
Glucose-Capillary: 153 mg/dL — ABNORMAL HIGH (ref 65–99)
Glucose-Capillary: 206 mg/dL — ABNORMAL HIGH (ref 65–99)
Glucose-Capillary: 215 mg/dL — ABNORMAL HIGH (ref 65–99)
Glucose-Capillary: 230 mg/dL — ABNORMAL HIGH (ref 65–99)
Glucose-Capillary: 245 mg/dL — ABNORMAL HIGH (ref 65–99)
Glucose-Capillary: 250 mg/dL — ABNORMAL HIGH (ref 65–99)
Glucose-Capillary: 270 mg/dL — ABNORMAL HIGH (ref 65–99)

## 2017-10-28 LAB — POCT I-STAT 3, ART BLOOD GAS (G3+)
BICARBONATE: 26.1 mmol/L (ref 20.0–28.0)
O2 SAT: 100 %
PCO2 ART: 46 mmHg (ref 32.0–48.0)
PO2 ART: 387 mmHg — AB (ref 83.0–108.0)
TCO2: 28 mmol/L (ref 22–32)
pH, Arterial: 7.355 (ref 7.350–7.450)

## 2017-10-28 LAB — CBC
HEMATOCRIT: 34 % — AB (ref 39.0–52.0)
HEMOGLOBIN: 11.5 g/dL — AB (ref 13.0–17.0)
MCH: 33.3 pg (ref 26.0–34.0)
MCHC: 33.8 g/dL (ref 30.0–36.0)
MCV: 98.6 fL (ref 78.0–100.0)
Platelets: 136 10*3/uL — ABNORMAL LOW (ref 150–400)
RBC: 3.45 MIL/uL — ABNORMAL LOW (ref 4.22–5.81)
RDW: 14.2 % (ref 11.5–15.5)
WBC: 17.6 10*3/uL — ABNORMAL HIGH (ref 4.0–10.5)

## 2017-10-28 LAB — BASIC METABOLIC PANEL
ANION GAP: 14 (ref 5–15)
BUN: 22 mg/dL — ABNORMAL HIGH (ref 6–20)
CALCIUM: 8.3 mg/dL — AB (ref 8.9–10.3)
CHLORIDE: 104 mmol/L (ref 101–111)
CO2: 23 mmol/L (ref 22–32)
Creatinine, Ser: 0.99 mg/dL (ref 0.61–1.24)
GFR calc non Af Amer: >60 mL/min (ref >60)
GLUCOSE: 188 mg/dL — AB (ref 65–99)
Potassium: 3.2 mmol/L — ABNORMAL LOW (ref 3.5–5.1)
Sodium: 141 mmol/L (ref 135–145)

## 2017-10-28 LAB — PROTIME-INR
INR: 1.19
PROTHROMBIN TIME: 15 s (ref 11.4–15.2)

## 2017-10-28 LAB — I-STAT CHEM 8, ED
BUN: 40 mg/dL — ABNORMAL HIGH (ref 6–20)
CHLORIDE: 105 mmol/L (ref 101–111)
Calcium, Ion: 1.05 mmol/L — ABNORMAL LOW (ref 1.15–1.40)
Creatinine, Ser: 1.1 mg/dL (ref 0.61–1.24)
Glucose, Bld: 126 mg/dL — ABNORMAL HIGH (ref 65–99)
HCT: 47 % (ref 39.0–52.0)
HEMOGLOBIN: 16 g/dL (ref 13.0–17.0)
POTASSIUM: 6.5 mmol/L — AB (ref 3.5–5.1)
SODIUM: 139 mmol/L (ref 135–145)
TCO2: 24 mmol/L (ref 22–32)

## 2017-10-28 LAB — PROCALCITONIN: Procalcitonin: 0.22 ng/mL

## 2017-10-28 LAB — MAGNESIUM
MAGNESIUM: 1 mg/dL — AB (ref 1.7–2.4)
MAGNESIUM: 1.3 mg/dL — AB (ref 1.7–2.4)

## 2017-10-28 LAB — PHOSPHORUS
PHOSPHORUS: 3 mg/dL (ref 2.5–4.6)
PHOSPHORUS: 1.2 mg/dL — AB (ref 2.5–4.6)

## 2017-10-28 MED ORDER — PRO-STAT SUGAR FREE PO LIQD: 30.0000 mL | Freq: Two times a day (BID) | ORAL | Status: DC

## 2017-10-28 MED ORDER — INSULIN ASPART 100 UNIT/ML ~~LOC~~ SOLN
6.0000 [IU] | SUBCUTANEOUS | Status: DC
  Administered 2017-10-28 – 2017-10-29 (×6): 6 [IU] via SUBCUTANEOUS

## 2017-10-28 MED ORDER — PRO-STAT SUGAR FREE PO LIQD
30.0000 mL | Freq: Every day | ORAL | Status: DC
  Administered 2017-10-28 – 2017-11-02 (×29): 30 mL
  Filled 2017-10-28 (×31): qty 30

## 2017-10-28 MED ORDER — METOPROLOL TARTRATE 5 MG/5ML IV SOLN
2.5000 mg | INTRAVENOUS | Status: DC | PRN
  Administered 2017-10-28: 5 mg via INTRAVENOUS
  Filled 2017-10-28: qty 5

## 2017-10-28 MED ORDER — METOPROLOL TARTRATE 25 MG/10 ML ORAL SUSPENSION
100.0000 mg | Freq: Two times a day (BID) | ORAL | Status: DC
  Administered 2017-10-28 – 2017-11-02 (×12): 100 mg
  Filled 2017-10-28 (×13): qty 40

## 2017-10-28 MED ORDER — ACETAMINOPHEN 160 MG/5ML PO SOLN
650.0000 mg | ORAL | Status: DC | PRN
  Administered 2017-10-28 – 2017-11-02 (×12): 650 mg
  Filled 2017-10-28 (×12): qty 20.3

## 2017-10-28 MED ORDER — MAGNESIUM OXIDE 400 (241.3 MG) MG PO TABS
400.0000 mg | ORAL_TABLET | Freq: Every day | ORAL | Status: DC
  Administered 2017-10-28 – 2017-11-02 (×6): 400 mg
  Filled 2017-10-28 (×6): qty 1

## 2017-10-28 MED ORDER — VITAL HIGH PROTEIN PO LIQD
1000.0000 mL | ORAL | Status: DC
  Administered 2017-10-28 – 2017-11-02 (×6): 1000 mL
  Filled 2017-10-28 (×4): qty 1000

## 2017-10-28 MED ORDER — POTASSIUM CHLORIDE 20 MEQ/15ML (10%) PO SOLN
40.0000 meq | ORAL | Status: AC
Start: 2017-10-28 — End: 2017-10-28
  Administered 2017-10-28 (×3): 40 meq
  Filled 2017-10-28 (×3): qty 30

## 2017-10-28 MED ORDER — LABETALOL HCL 5 MG/ML IV SOLN
20.0000 mg | INTRAVENOUS | Status: AC | PRN
  Administered 2017-10-28 – 2017-10-31 (×10): 20 mg via INTRAVENOUS
  Filled 2017-10-28 (×11): qty 4

## 2017-10-28 MED FILL — Thrombin For Soln 5000 Unit: CUTANEOUS | Qty: 5000 | Status: AC

## 2017-10-28 MED FILL — Gelatin Absorbable MT Powder: OROMUCOSAL | Qty: 1 | Status: AC

## 2017-10-28 NOTE — Progress Notes (Signed)
Unavailable to get EEG at this time. Pt has bandages. Nurse checking with Neuro surg to see if EEG is warranted and bandages can come off.  Will check later when schedule permits

## 2017-10-28 NOTE — Progress Notes (Signed)
OT Cancellation Note  Patient Details Name: [name] MRN: [id number] DOB: 17-May-1941   Cancelled Treatment:    Reason Eval/Treat Not Completed: Patient not medically ready (pt on vent and not yet medically appropriate).    Almon Register  [phone] 10/28/2017, 10:25 AM

## 2017-10-28 NOTE — Progress Notes (Signed)
No acute events PERRL Trace withdraw to painful stimulus Bandage c/d/i I have independently reviewed his CT.  Much improved.  No significant residual hematoma. Keep drain for now Will give through the weekend to wake up and have family discussion if not improving by then

## 2017-10-28 NOTE — Progress Notes (Signed)
PT Cancellation Note  Patient Details Name: [name] MRN: [id number] DOB: 1941-02-07   Cancelled Treatment:    Reason Eval/Treat Not Completed: Medical issues which prohibited therapy(pt on vent and not yet medically appropriate)   Khyler Urda B Prakash Kimberling 10/28/2017, 8:03 AM  Elwyn Reach, [place]

## 2017-10-28 NOTE — Progress Notes (Signed)
Initial Nutrition Assessment  DOCUMENTATION CODES:   Obesity unspecified  INTERVENTION:   Vital High Protein @ 40 ml/hr (960 ml/day) 30 ml Prostat five times per day  Provides: 1460 kcal, 159 grams protein, and 802 ml free water.    NUTRITION DIAGNOSIS:   Increased nutrient needs related to (surgery) as evidenced by estimated needs.  GOAL:   Patient will meet greater than or equal to 90% of their needs  MONITOR:   TF tolerance, Vent status, Labs  REASON FOR ASSESSMENT:   Consult, Ventilator Enteral/tube feeding initiation and management  ASSESSMENT:   Pt with PMH of ETOH, DM, HTN, afib, HLD admitted 1/17 after fall 1/16 with SDH s/p crani.    Noted hyperglycemia. Remains on vent after crani.   Patient is currently intubated on ventilator support MV: 9.5 L/min Temp (24hrs), Avg:99 F (37.2 C), Min:96.1 F (35.6 C), Max:100.7 F (38.2 C)  Medications reviewed and include: colace, mag-ox, KCl, senokot, vitamin C Labs reviewed: K+ 3.2 (L) CBG: 230    NUTRITION - FOCUSED PHYSICAL EXAM:    Most Recent Value  Orbital Region  No depletion  Upper Arm Region  No depletion  Thoracic and Lumbar Region  No depletion  Buccal Region  No depletion  Temple Region  No depletion  Clavicle Bone Region  No depletion  Clavicle and Acromion Bone Region  No depletion  Scapular Bone Region  Unable to assess  Dorsal Hand  No depletion  Patellar Region  No depletion  Anterior Thigh Region  No depletion  Posterior Calf Region  No depletion  Edema (RD Assessment)  None  Hair  Reviewed  Eyes  Unable to assess  Mouth  Unable to assess  Skin  Reviewed  Nails  Reviewed       Diet Order:  Diet NPO time specified  EDUCATION NEEDS:   No education needs have been identified at this time  Skin:  Skin Assessment: Reviewed RN Assessment  Last BM:  unknown  Height:   Ht Readings from Last 1 Encounters:  10/26/17 5\' 10"  (1.778 m)    Weight:   Wt Readings from Last 1  Encounters:  10/26/17 237 lb (107.5 kg)    Ideal Body Weight:  75.4 kg  BMI:  There is no height or weight on file to calculate BMI.  Estimated Nutritional Needs:   Kcal:  8185-6314  Protein:  >150 grams  Fluid:  >1.5 L/day  Maylon Peppers RD, LDN, CNSC [phone] Pager [phone] After Hours Pager

## 2017-10-28 NOTE — Anesthesia Postprocedure Evaluation (Signed)
Anesthesia Post Note  Patient: [name]  Procedure(s) Performed: CRANIOTOMY HEMATOMA EVACUATION SUBDURAL (Left Head)     Patient location during evaluation: NICU Anesthesia Type: General Level of consciousness: patient remains intubated per anesthesia plan Vital Signs Assessment: post-procedure vital signs reviewed and stable Respiratory status: patient on ventilator - see flowsheet for VS and patient remains intubated per anesthesia plan Anesthetic complications: no    Last Vitals:  Vitals:   10/28/17 1936 10/28/17 2000  BP: 106/83   Pulse: (!) 120   Resp: 17   Temp:  (!) 38.1 C  SpO2: 99%     Last Pain:  Vitals:   10/28/17 2000  TempSrc: Oral                 [name]

## 2017-10-28 NOTE — Progress Notes (Signed)
EEG complete - results pending 

## 2017-10-28 NOTE — Progress Notes (Signed)
RT transported pt to and from CT without event. RT will continue to monitor. 

## 2017-10-28 NOTE — Progress Notes (Signed)
PULMONARY / CRITICAL CARE MEDICINE   Name: [name] MRN: [id number] DOB: 25-Jul-1941    ADMISSION DATE:  11/06/2017 CONSULTATION DATE:  1/17  REFERRING MD:  Ditty   CHIEF COMPLAINT:  Vent management   HISTORY OF PRESENT ILLNESS:   77 year old male with history of DM, hypertension, A. fib on Coumadin, hyperlipidemia presented 1/17 to the ER as code stroke after he was found unresponsive.  Of note he had a fall 1/16 at which time he came to the ER and had negative head CT.  He was discharged home.  Woke up on the morning of admission with a headache and went to bed, was then found unresponsive.  Was found to have large left subdural hematoma with midline shift. KCentra given.  Seen in consultation by neurosurgery and taken urgently to the OR for evacuation of SDH.  He remained intubated postop and critical care consulted for vent and medical management.    SUBJECTIVE:  Minimally responsive on ventilator  VITAL SIGNS: BP (!) 178/82   Pulse (!) 115   Temp 100.2 F (37.9 C) (Axillary)   Resp 16   SpO2 98%   HEMODYNAMICS:    VENTILATOR SETTINGS: Vent Mode: PRVC FiO2 (%):  [35 %-100 %] 35 % Set Rate:  [15 bmp] 15 bmp Vt Set:  [567 mL-570 mL] 567 mL PEEP:  [5 cmH20] 5 cmH20 Plateau Pressure:  [14 cmH20-20 cmH20] 14 cmH20  INTAKE / OUTPUT:  Intake/Output Summary (Last 24 hours) at 10/28/2017 0854 Last data filed at 10/28/2017 0700 Gross per 24 hour  Intake 2937.21 ml  Output 1440 ml  Net 1497.21 ml     PHYSICAL EXAMINATION: General: 77 year old white male currently on ventilator HEENT: Orally intubated.  Right JP drain in place with bloody output Pulmonary: Clear to auscultation no accessory use equal chest rise on ventilator Cardiac: Irregular irregular.  Atrial fibrillation on monitor Abdomen: Soft, nontender, positive bowel sounds Extremities/musculoskeletal: Warm, dry, brisk cap refill, equal strength and bulk. GU: Clear yellow Neuro: Grimaces some to  noxious stimulus otherwise unresponsive  LABS:  BMET Recent Labs  Lab 10/26/17 2323 10/13/2017 1228 10/21/2017 1253 10/30/2017 1438 10/22/2017 1500 10/28/17 0406  NA 141 140 142 142 142 141  K 3.9 4.3 4.3 4.2 4.3 3.2*  CL 105 101 99*  --   --  104  CO2 22 25  --   --   --  23  BUN 25* 22* 25*  --   --  22*  CREATININE 1.14 1.10 0.90  --   --  0.99  GLUCOSE 135* 248* 248* 261*  --  188*    Electrolytes Recent Labs  Lab 10/26/17 2323 11/10/2017 1228 10/28/17 0406  CALCIUM 9.5 9.1 8.3*    CBC Recent Labs  Lab 11/07/2017 1228  11/02/2017 1438 11/05/2017 1500 10/28/17 0406  WBC 14.9*  --   --   --  17.6*  HGB 14.1   < > 12.2* 12.2* 11.5*  HCT 40.7   < > 36.0* 36.0* 34.0*  PLT 149*  --   --   --  136*   < > = values in this interval not displayed.    Coag's Recent Labs  Lab 11/05/2017 1300 10/11/2017 1617 10/28/17 0406  APTT 39*  --   --   INR 2.68 1.36 1.19    Sepsis Markers Recent Labs  Lab 11/04/2017 1255  LATICACIDVEN 2.54*    ABG Recent Labs  Lab 11/08/2017 1500 10/20/2017 1626  PHART 7.269* 7.355  PCO2ART 57.3* 46.0  PO2ART 421.0* 387.0*    Liver Enzymes Recent Labs  Lab 10/26/2017 1228  AST 32  ALT 27  ALKPHOS 68  BILITOT 2.2*  ALBUMIN 3.9    Cardiac Enzymes No results for input(s): TROPONINI, PROBNP in the last 168 hours.  Glucose Recent Labs  Lab 10/24/2017 1626 10/21/2017 2000 10/28/17 0013 10/28/17 0404 10/28/17 0827  GLUCAP 250* 221* 153* 192* 230*    Imaging Ct Head Wo Contrast  Result Date: 10/28/2017 CLINICAL DATA:  77 y/o  M; follow-up subdural hemorrhage. EXAM: CT HEAD WITHOUT CONTRAST TECHNIQUE: Contiguous axial images were obtained from the base of the skull through the vertex without intravenous contrast. COMPARISON:  10/14/2017 CT head. FINDINGS: Brain: Interval evacuation of left convexity subdural hematoma with postoperative pneumocephalus. Small residual hemorrhage along the falx and left tentorium. 8 mm residual left-to-right  midline shift. Decreased right ventricular entrapment. Improved patency of left lateral ventricle. Improved patency of basilar cisterns and resolution of left-sided uncal herniation. Left lateral and anterior temporal lobe contusion with mild edema is stable in distribution. Additional small areas of cortical contusion or present bilateral inferior frontal lobes and right anterior temporal lobe. Interval tiny focus of parenchymal hemorrhage in the right inferior frontal lobe. Vascular: Calcific atherosclerosis of carotid siphons. Skull: Postsurgical changes related to left hemi craniotomy with air and edema in the overlying scalp, left-sided extra-axial drain, and skin staples. Sinuses/Orbits: Right maxillary sinus mucous retention cyst and mild paranasal sinus mucosal thickening. Normal aeration of mastoid air cells. Bilateral intra-ocular lens replacement. Other: None. IMPRESSION: 1. Evacuation of left convexity subdural hematoma with small residual. 2. Decreased mass effect: 8 mm left-to-right midline shift. Decreased right ventricular entrapment. Improved patency of basilar cisterns and left lateral ventricle. No residual downward herniation. 3. Bilateral inferior frontal, right anterior temporal, and left lateral/anterior temporal cortical contusions. Small interval tiny focus of hemorrhage in right inferior frontal lobe. 4. Left hemi craniotomy postsurgical changes with air and edema in the overlying scalp and left-sided pneumocephalus. Electronically Signed   By: Kristine Garbe M.D.   On: 10/28/2017 06:26   Dg Chest Port 1 View  Result Date: 10/13/2017 CLINICAL DATA:  Hypoxia EXAM: PORTABLE CHEST 1 VIEW COMPARISON:  March 18, 2015 FINDINGS: Endotracheal tube tip is 5.1 cm above the carina. Nasogastric tube tip and side port are below the diaphragm. No pneumothorax. There is a small left pleural effusion with mild left base atelectasis. Lungs elsewhere clear. Heart is upper normal in size with  pulmonary vascularity within normal limits. There is aortic atherosclerosis. No adenopathy. No bone lesions. IMPRESSION: Tube positions as described without pneumothorax. Small left effusion with mild left base atelectasis. Lungs elsewhere clear. Stable cardiac silhouette. There is aortic atherosclerosis. Aortic Atherosclerosis (ICD10-I70.0). Electronically Signed   By: Lowella Grip III M.D.   On: 11/08/2017 13:08   Ct Head Code Stroke Wo Contrast  Result Date: 10/25/2017 CLINICAL DATA:  Code stroke.  Altered mental status.  Fall. EXAM: CT HEAD WITHOUT CONTRAST TECHNIQUE: Contiguous axial images were obtained from the base of the skull through the vertex without intravenous contrast. COMPARISON:  10/26/2017 FINDINGS: Brain: There is a large, predominantly hyperdense subdural hematoma diffusely over the left cerebral convexity which measures up to approximately 3 cm in thickness. There is prominent mass effect on the left cerebral hemisphere with diffuse sulcal effacement, partial effacement of the lateral and third ventricles, and 2.6 cm of rightward midline shift. There is new mild dilatation of the atrium and occipital and  temporal horns of the right lateral ventricle. A small amount of subdural blood extends along the falx and left tentorium. There is also a new 3 cm parenchymal hemorrhage in the left temporal lobe with surrounding vasogenic edema. There is left uncal herniation, mass effect on the midbrain with effacement of the ambient cistern, and effacement of the suprasellar cistern. No acute large territory vascular arterial infarct is identified. Vascular: Calcified atherosclerosis at the skull base. Skull: No fracture or focal osseous lesion. Sinuses/Orbits: Scattered mild paranasal sinus mucosal thickening. Clear mastoid air cells. Bilateral cataract extraction. Other: None. ASPECTS [facility] Stroke Program Early CT Score) Not scored due to the presence of hemorrhage. IMPRESSION: 1. Large  subdural hematoma over the left cerebral convexity. Prominent mass effect with uncal herniation, 2.6 cm of rightward midline shift, and trapping of the right lateral ventricle. 2. 3 cm hemorrhagic contusion in the left temporal lobe with surrounding edema. Critical Value/emergent results were called by telephone at the time of interpretation on 10/22/2017 at 12:57 pm to Dr. Jola Schmidt , who verbally acknowledged these results. Electronically Signed   By: Logan Bores M.D.   On: 10/12/2017 12:58     STUDIES:  Ct head 1/17>>>1. Large subdural hematoma over the left cerebral convexity. Prominent mass effect with uncal herniation, 2.6 cm of rightward midline shift, and trapping of the right lateral ventricle. 2. 3 cm hemorrhagic contusion in the left temporal lobe with surrounding edema.  CULTURES: UC 1/18>>>  ANTIBIOTICS: Ancef 1/17 > peri-op   SIGNIFICANT EVENTS: 1/17 OR>>> crani, evacuation of L SDH   LINES/TUBES: ETT 1/17 >>  DISCUSSION: 77 year old male with large SDH in setting fall on Coumadin.  s/P OR for craniotomy and evacuation of subdural hematoma by neurosurgery.  Remains vented postop.  ASSESSMENT / PLAN:   Large left subdural hematoma with midline shift in setting fall on Coumadin- status post OR for evacuation -follow-up CT head 1/18 without evidence of worsening, expected postoperative changes. Plan RASS goal 0 PAD protocol Physical therapy when appropriate Serial neuro checks Will obtain EEG rule out subclinical seizure Treat fever JP management per neurosurgery Repeat coags in a.m. Vit K (plan to complete 3 doses)  Acute respiratory failure- remains on vent post neurosurgery for evacuation of subdural hematoma Chest x-ray was personally reviewed this is clear, endotracheal tubes in satisfactory position His mental status will not support extubation at this point Plan Continue full ventilator support Repeat chest x-ray in a.m. PAD protocol RASS of 0 But  repeat ABG in a.m. Daily assessment for readiness for weaning  Hypertension Plan: Add as needed labetalol goal systolic blood pressure less than 160  Chronic atrial fibrillation; currently with rapid ventricular response -Anti-coagulation reversed Plan Rate control; adding scheduled Lopressor Continue telemetry monitoring Hold anticoagulation until cleared by surgery to resume  Fluid and electrolyte imbalance: Hypokalemia Plan Replace and recheck in a.m.  Fever and leukocytosis; suspect this is postop systemic inflammatory response Plan Send urine culture Check procalcitonin Treat fever Hold off on antibiotics  Anemia of chronic disease Plan Trend CBC Transfuse per protocol  DM with hyperglycemia Plan Sliding scale insulin Add basal dosing of aspart  FAMILY  - Updates: Family updated 1/17  - Inter-disciplinary family meet or Palliative Care meeting due by:  1/24  DVT prophylaxis: scd SUP: ppi   Diet: start tubefeeds Activity: BR Disposition : Neuro-icu   My cct 40 min   10/28/2017 8:42 AM

## 2017-10-28 NOTE — Procedures (Signed)
EEG Report  Clinical History:  Fall with resultant large left SDH and midline shift s/p evacuation with improved CT findings.    Technical Summary:  A 19 channel digital EEG recording was performed using the 10-20 international system of electrode placement.  Bipolar and Referential montages were used.  The total recording time was approx 20 minutes.  Findings: This is a very low voltage recording.  There is no posterior dominant alpha rhythm.  Posterior background frequencies average about 5 Hz and symmetrical.   No focal slowing is present.  Intermittently, there are triphasic waveforms in a synchronous and symmetrical fashion.  There are no epileptiform discharges or electrographic seizures present.    Impression:  This is an abnormal EEG.  There is moderate generalized low voltage slowing of brain activity consistent with metabolic, toxic, infectious, intracranial hemorrhage and/or sedation effects.  Clinical correlation is recommended.  The patient is not in non-convulsive status epilepticus.  Rogue Jury, MS,  MD

## 2017-10-29 ENCOUNTER — Other Ambulatory Visit: Payer: Self-pay

## 2017-10-29 ENCOUNTER — Inpatient Hospital Stay (HOSPITAL_COMMUNITY): Payer: Medicare HMO

## 2017-10-29 LAB — BLOOD GAS, ARTERIAL
ACID-BASE DEFICIT: 0.9 mmol/L (ref 0.0–2.0)
BICARBONATE: 23.5 mmol/L (ref 20.0–28.0)
DRAWN BY: 51133
FIO2: 35
LHR: 15 {breaths}/min
MECHVT: 567 mL
O2 SAT: 99.4 %
PEEP/CPAP: 5 cmH2O
PH ART: 7.382 (ref 7.350–7.450)
PO2 ART: 213 mmHg — AB (ref 83.0–108.0)
Patient temperature: 98.4
pCO2 arterial: 40.3 mmHg (ref 32.0–48.0)

## 2017-10-29 LAB — CBC
HCT: 30.1 % — ABNORMAL LOW (ref 39.0–52.0)
Hemoglobin: 10.1 g/dL — ABNORMAL LOW (ref 13.0–17.0)
MCH: 33.7 pg (ref 26.0–34.0)
MCHC: 33.6 g/dL (ref 30.0–36.0)
MCV: 100.3 fL — ABNORMAL HIGH (ref 78.0–100.0)
PLATELETS: 120 10*3/uL — AB (ref 150–400)
RBC: 3 MIL/uL — ABNORMAL LOW (ref 4.22–5.81)
RDW: 13.9 % (ref 11.5–15.5)
WBC: 14.3 10*3/uL — ABNORMAL HIGH (ref 4.0–10.5)

## 2017-10-29 LAB — COMPREHENSIVE METABOLIC PANEL
ALBUMIN: 2.9 g/dL — AB (ref 3.5–5.0)
ALT: 14 U/L — ABNORMAL LOW (ref 17–63)
ANION GAP: 10 (ref 5–15)
AST: 19 U/L (ref 15–41)
Alkaline Phosphatase: 44 U/L (ref 38–126)
BUN: 26 mg/dL — ABNORMAL HIGH (ref 6–20)
CO2: 23 mmol/L (ref 22–32)
Calcium: 8 mg/dL — ABNORMAL LOW (ref 8.9–10.3)
Chloride: 109 mmol/L (ref 101–111)
Creatinine, Ser: 0.86 mg/dL (ref 0.61–1.24)
GFR calc Af Amer: >60 mL/min (ref >60)
GFR calc non Af Amer: >60 mL/min (ref >60)
GLUCOSE: 257 mg/dL — AB (ref 65–99)
POTASSIUM: 3.5 mmol/L (ref 3.5–5.1)
SODIUM: 142 mmol/L (ref 135–145)
Total Bilirubin: 2 mg/dL — ABNORMAL HIGH (ref 0.3–1.2)
Total Protein: 5.4 g/dL — ABNORMAL LOW (ref 6.5–8.1)

## 2017-10-29 LAB — GLUCOSE, CAPILLARY
GLUCOSE-CAPILLARY: 272 mg/dL — AB (ref 65–99)
GLUCOSE-CAPILLARY: 227 mg/dL — AB (ref 65–99)
GLUCOSE-CAPILLARY: 172 mg/dL — AB (ref 65–99)
Glucose-Capillary: 247 mg/dL — ABNORMAL HIGH (ref 65–99)
Glucose-Capillary: 255 mg/dL — ABNORMAL HIGH (ref 65–99)

## 2017-10-29 LAB — URINE CULTURE: Culture: NO GROWTH

## 2017-10-29 LAB — MAGNESIUM: MAGNESIUM: 1.6 mg/dL — AB (ref 1.7–2.4)

## 2017-10-29 LAB — PROTIME-INR
INR: 1.25
PROTHROMBIN TIME: 15.6 s — AB (ref 11.4–15.2)

## 2017-10-29 LAB — PHOSPHORUS: PHOSPHORUS: 2.1 mg/dL — AB (ref 2.5–4.6)

## 2017-10-29 MED ORDER — ORAL CARE MOUTH RINSE
15.0000 mL | OROMUCOSAL | Status: DC
  Administered 2017-10-29 – 2017-11-03 (×48): 15 mL via OROMUCOSAL

## 2017-10-29 MED ORDER — INSULIN ASPART 100 UNIT/ML ~~LOC~~ SOLN
7.0000 [IU] | SUBCUTANEOUS | Status: DC
  Administered 2017-10-29 – 2017-10-30 (×6): 7 [IU] via SUBCUTANEOUS

## 2017-10-29 MED ORDER — INSULIN ASPART 100 UNIT/ML ~~LOC~~ SOLN
0.0000 [IU] | SUBCUTANEOUS | Status: DC
  Administered 2017-10-29: 7 [IU] via SUBCUTANEOUS
  Administered 2017-10-29: 11 [IU] via SUBCUTANEOUS
  Administered 2017-10-29: 7 [IU] via SUBCUTANEOUS
  Administered 2017-10-29: 11 [IU] via SUBCUTANEOUS
  Administered 2017-10-29: 4 [IU] via SUBCUTANEOUS
  Administered 2017-10-29: 11 [IU] via SUBCUTANEOUS
  Administered 2017-10-30 (×3): 4 [IU] via SUBCUTANEOUS
  Administered 2017-10-30: 7 [IU] via SUBCUTANEOUS
  Administered 2017-10-30: 4 [IU] via SUBCUTANEOUS
  Administered 2017-10-30: 11 [IU] via SUBCUTANEOUS
  Administered 2017-10-30: 7 [IU] via SUBCUTANEOUS
  Administered 2017-10-31: 4 [IU] via SUBCUTANEOUS
  Administered 2017-10-31 (×3): 7 [IU] via SUBCUTANEOUS
  Administered 2017-10-31: 15 [IU] via SUBCUTANEOUS
  Administered 2017-10-31 – 2017-11-01 (×2): 7 [IU] via SUBCUTANEOUS
  Administered 2017-11-01 (×2): 4 [IU] via SUBCUTANEOUS
  Administered 2017-11-01: 7 [IU] via SUBCUTANEOUS
  Administered 2017-11-01: 4 [IU] via SUBCUTANEOUS
  Administered 2017-11-02: 3 [IU] via SUBCUTANEOUS
  Administered 2017-11-02 (×2): 4 [IU] via SUBCUTANEOUS
  Administered 2017-11-02: 11 [IU] via SUBCUTANEOUS
  Administered 2017-11-02 (×2): 7 [IU] via SUBCUTANEOUS
  Administered 2017-11-02: 11 [IU] via SUBCUTANEOUS
  Administered 2017-11-03 (×2): 7 [IU] via SUBCUTANEOUS

## 2017-10-29 MED ORDER — SODIUM CHLORIDE 0.9 % IV SOLN
1000.0000 mg | Freq: Two times a day (BID) | INTRAVENOUS | Status: DC
  Administered 2017-10-29 – 2017-10-30 (×3): 1000 mg via INTRAVENOUS
  Filled 2017-10-29 (×4): qty 10

## 2017-10-29 MED ORDER — SODIUM CHLORIDE 0.9 % IV SOLN
500.0000 mg | Freq: Once | INTRAVENOUS | Status: AC
  Administered 2017-10-29: 500 mg via INTRAVENOUS
  Filled 2017-10-29: qty 5

## 2017-10-29 NOTE — Progress Notes (Signed)
PULMONARY / CRITICAL CARE MEDICINE   Name: [name] MRN: [id number] DOB: [birth year]    ADMISSION DATE:  10/31/2017 CONSULTATION DATE:  1/17  REFERRING MD:  Ditty   CHIEF COMPLAINT:  Vent management   HISTORY OF PRESENT ILLNESS:   77 year old male with history of DM, hypertension, A. fib on Coumadin, hyperlipidemia presented 1/17 to the ER as code stroke after he was found unresponsive.  Of note he had a fall 1/16 at which time he came to the ER and had negative head CT.  He was discharged home.  Woke up on the morning of admission with a headache and went to bed, was then found unresponsive.  Was found to have large left subdural hematoma with midline shift. KCentra given.  Seen in consultation by neurosurgery and taken urgently to the OR for evacuation of SDH.  He remained intubated postop and critical care consulted for vent and medical management.    SUBJECTIVE:  Tolerating first pressure support 5 and 5 of PEEP with tidal volume 630 cc respiratory rate 16.  He is not awake enough for consideration of extubation.  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  He will most likely need a tracheostomy  to be liberated from the ventilator  VITAL SIGNS: BP 128/86   Pulse (!) 125   Temp 98.4 F (36.9 C) (Oral)   Resp 16   Wt 113.2 kg (249 lb 9 oz)   SpO2 100%   BMI 35.81 kg/m   HEMODYNAMICS:    VENTILATOR SETTINGS: Vent Mode: PSV;CPAP FiO2 (%):  [30 %-35 %] 35 % Set Rate:  [15 bmp] 15 bmp Vt Set:  [196 mL] 567 mL PEEP:  [5 cmH20] 5 cmH20 Pressure Support:  [5 cmH20] 5 cmH20 Plateau Pressure:  [11 cmH20-12 cmH20] 11 cmH20  INTAKE / OUTPUT:  Intake/Output Summary (Last 24 hours) at 10/29/2017 0825 Last data filed at 10/29/2017 0700 Gross per 24 hour  Intake 3473.33 ml  Output 1935 ml  Net 1538.33 ml     PHYSICAL EXAMINATION: General: Morbidly obese male currently on  endotracheal tube connected to mechanical ventilatory support tolerating pressure support 5/5 HEENT: Endotracheal tube to ventilator, orogastric tube PSY: Unable Neuro: Does not follow commands CV: Heart sounds are regular regular PULM: Decreased breath sounds in the base QI:WLNL, non-tender, bsx4 active  Extremities: warm/dry, 1+ edema  Skin: no rashes or lesions   LABS:  BMET Recent Labs  Lab 10/31/2017 1228 11/08/2017 1253 11/08/2017 1438 11/09/2017 1500 10/28/17 0406 10/29/17 0520  NA 140 142 142 142 141 142  K 4.3 4.3 4.2 4.3 3.2* 3.5  CL 101 99*  --   --  104 109  CO2 25  --   --   --  23 23  BUN 22* 25*  --   --  22* 26*  CREATININE 1.10 0.90  --   --  0.99 0.86  GLUCOSE 248* 248* 261*  --  188* 257*    Electrolytes Recent Labs  Lab 10/31/2017 1228 10/28/17 0406 10/28/17 1027 10/28/17 1523 10/29/17 0520  CALCIUM 9.1 8.3*  --   --  8.0*  MG  --   --  1.0* 1.3* 1.6*  PHOS  --   --  3.0 1.2* 2.1*    CBC Recent Labs  Lab 11/10/2017 1228  10/12/2017 1500 10/28/17 0406 10/29/17 0520  WBC 14.9*  --   --  17.6* 14.3*  HGB  14.1   < > 12.2* 11.5* 10.1*  HCT 40.7   < > 36.0* 34.0* 30.1*  PLT 149*  --   --  136* 120*   < > = values in this interval not displayed.    Coag's Recent Labs  Lab 10/17/2017 1300 10/29/2017 1617 10/28/17 0406 10/29/17 0520  APTT 39*  --   --   --   INR 2.68 1.36 1.19 1.25    Sepsis Markers Recent Labs  Lab 10/29/2017 1255 10/28/17 1027  LATICACIDVEN 2.54*  --   PROCALCITON  --  0.22    ABG Recent Labs  Lab 10/18/2017 1500 10/21/2017 1626 10/29/17 0425  PHART 7.269* 7.355 7.382  PCO2ART 57.3* 46.0 40.3  PO2ART 421.0* 387.0* 213*    Liver Enzymes Recent Labs  Lab 11/09/2017 1228 10/29/17 0520  AST 32 19  ALT 27 14*  ALKPHOS 68 44  BILITOT 2.2* 2.0*  ALBUMIN 3.9 2.9*    Cardiac Enzymes No results for input(s): TROPONINI, PROBNP in the last 168 hours.  Glucose Recent Labs  Lab 10/28/17 1534 10/28/17 2024 10/28/17 2232  10/28/17 2355 10/29/17 0401 10/29/17 0808  GLUCAP 245* 206* 215* 298* 247* 272*    Imaging Dg Chest Port 1 View  Result Date: 10/29/2017 CLINICAL DATA:  Acute respiratory failure EXAM: PORTABLE CHEST 1 VIEW COMPARISON:  Yesterday FINDINGS: Endotracheal tube tip just below the clavicular heads. An orogastric tube reaches the stomach. Mild interstitial coarsening. No edema, effusion, or pneumothorax. Normal heart size. IMPRESSION: 1. Stable positioning of endotracheal and orogastric tubes. 2. Stable inflation. Electronically Signed   By: Monte Fantasia M.D.   On: 10/29/2017 07:45     STUDIES:  Ct head 1/17>>>1. Large subdural hematoma over the left cerebral convexity. Prominent mass effect with uncal herniation, 2.6 cm of rightward midline shift, and trapping of the right lateral ventricle. 2. 3 cm hemorrhagic contusion in the left temporal lobe with surrounding edema.  CULTURES: UC 1/18>>> not done  ANTIBIOTICS: Ancef 1/17 > peri-op   SIGNIFICANT EVENTS: 1/17 OR>>> crani, evacuation of L SDH   LINES/TUBES: ETT 1/17 >> Orogastric tube 10/19/2017>>  DISCUSSION: 77 year old male with large SDH in setting fall on Coumadin.  s/P OR for craniotomy and evacuation of subdural hematoma by neurosurgery.  Remains vented postop.  ASSESSMENT / PLAN:   Large left subdural hematoma with midline shift in setting fall on Coumadin- status post OR for evacuation -follow-up CT head 1/18 without evidence of worsening, expected postoperative changes. Plan RASS goal 0 PAD protocol Physical therapy when appropriate Serial neuro checks Will obtain EEG rule out subclinical seizure Treat fever JP management per neurosurgery 10/29/2017 INR has been normalized  Acute respiratory failure- remains on vent post neurosurgery for evacuation of subdural hematoma Chest x-ray was personally reviewed this is clear, endotracheal tubes in satisfactory position His mental status will not support extubation at  this point Plan Continue full ventilator support Daily chest x-ray while intubated PAD protocol RASS of 0 Wean FiO2 Daily assessment for readiness for weaning Note he has obstructive sleep apnea is been noncompliant for the last 3 years with CPAP. Immediate tracheostomy to be successfully liberated from mechanical ventilatory support due to his altered mental status from subdural hematoma.  Hypertension Plan: Add as needed labetalol goal systolic blood pressure less than 160  Chronic atrial fibrillation; currently with rapid ventricular response -Anti-coagulation reversed Plan Rate control; adding scheduled Lopressor Continue telemetry monitoring Hold anticoagulation until cleared by surgery to resume Due to frequent falls he  may not be a candidate for further anticoagulation  Fluid and electrolyte imbalance: Hypokalemia Plan Replace and recheck in a.m.  Fever and leukocytosis; suspect this is postop systemic inflammatory response Plan Send urine culture  procalcitonin 0.22 Treat fever Hold off on antibiotics  Anemia of chronic disease Recent Labs    10/28/17 0406 10/29/17 0520  HGB 11.5* 10.1*    Plan Trend CBC Transfuse per protocol  DM with hyperglycemia CBG (last 3)  Recent Labs    10/28/17 2355 10/29/17 0401 10/29/17 0808  GLUCAP 298* 247* 272*    Plan Sliding scale insulin increased 1/19 Add basal dosing of aspart  FAMILY  - Updates: Family updated 1/17  - Inter-disciplinary family meet or Palliative Care meeting due by:  1/24  DVT prophylaxis: scd SUP: ppi   Diet: start tubefeeds Activity: BR Disposition : Neuro-icu   My cct 30 min   Richardson Landry Zlaty Alexa ACNP Maryanna Shape PCCM Pager [phone] till 1 pm If no answer page [phone] 10/29/2017, 8:25 AM

## 2017-10-29 NOTE — Progress Notes (Signed)
Patient ID: [name], male   DOB: 1941/08/09, 77 y.o.   MRN: [id number] Subjective: The patient is comatose.  His wife and daughter are at the bedside.  Objective: Vital signs in last 24 hours: Temp:  [98.4 F (36.9 C)-102.2 F (39 C)] 98.4 F (36.9 C) (01/19 0400) Pulse Rate:  [72-138] 125 (01/19 0736) Resp:  [14-25] 16 (01/19 0736) BP: (106-176)/(64-104) 128/86 (01/19 0736) SpO2:  [96 %-100 %] 100 % (01/19 0736) Arterial Line BP: (89-179)/(55-94) 103/88 (01/19 0700) FiO2 (%):  [30 %-35 %] 35 % (01/19 0736) Weight:  [113.2 kg (249 lb 9 oz)] 113.2 kg (249 lb 9 oz) (01/19 0500) Estimated body mass index is 35.81 kg/m as calculated from the following:   Height as of 10/26/17: 5\' 10"  (1.778 m).   Weight as of this encounter: 113.2 kg (249 lb 9 oz).   Intake/Output from previous day: 01/18 0701 - 01/19 0700 In: 3573.3 [I.V.:2400; NG/GT:713.3; IV Piggyback:460] Out: 2115 [Urine:1855; Drains:260] Intake/Output this shift: No intake/output data recorded.  Physical exam Glasgow Coma Scale 6 intubated [id number] and he will weakly flex to painful stimuli.  His pupils are equal.  The patient's follow-up head CT performed yesterday demonstrates good evacuation of the left subdural hematoma.  He has persistent midline shift.  The patient's JP drain has been putting out bloody spinal fluid.  Lab Results: Recent Labs    10/28/17 0406 10/29/17 0520  WBC 17.6* 14.3*  HGB 11.5* 10.1*  HCT 34.0* 30.1*  PLT 136* 120*   BMET Recent Labs    10/28/17 0406 10/29/17 0520  NA 141 142  K 3.2* 3.5  CL 104 109  CO2 23 23  GLUCOSE 188* 257*  BUN 22* 26*  CREATININE 0.99 0.86  CALCIUM 8.3* 8.0*    Studies/Results: Ct Head Wo Contrast  Result Date: 10/28/2017 CLINICAL DATA:  77 y/o  M; follow-up subdural hemorrhage. EXAM: CT HEAD WITHOUT CONTRAST TECHNIQUE: Contiguous axial images were obtained from the base of the skull through the vertex without intravenous contrast.  COMPARISON:  11/06/2017 CT head. FINDINGS: Brain: Interval evacuation of left convexity subdural hematoma with postoperative pneumocephalus. Small residual hemorrhage along the falx and left tentorium. 8 mm residual left-to-right midline shift. Decreased right ventricular entrapment. Improved patency of left lateral ventricle. Improved patency of basilar cisterns and resolution of left-sided uncal herniation. Left lateral and anterior temporal lobe contusion with mild edema is stable in distribution. Additional small areas of cortical contusion or present bilateral inferior frontal lobes and right anterior temporal lobe. Interval tiny focus of parenchymal hemorrhage in the right inferior frontal lobe. Vascular: Calcific atherosclerosis of carotid siphons. Skull: Postsurgical changes related to left hemi craniotomy with air and edema in the overlying scalp, left-sided extra-axial drain, and skin staples. Sinuses/Orbits: Right maxillary sinus mucous retention cyst and mild paranasal sinus mucosal thickening. Normal aeration of mastoid air cells. Bilateral intra-ocular lens replacement. Other: None. IMPRESSION: 1. Evacuation of left convexity subdural hematoma with small residual. 2. Decreased mass effect: 8 mm left-to-right midline shift. Decreased right ventricular entrapment. Improved patency of basilar cisterns and left lateral ventricle. No residual downward herniation. 3. Bilateral inferior frontal, right anterior temporal, and left lateral/anterior temporal cortical contusions. Small interval tiny focus of hemorrhage in right inferior frontal lobe. 4. Left hemi craniotomy postsurgical changes with air and edema in the overlying scalp and left-sided pneumocephalus. Electronically Signed   By: Kristine Garbe M.D.   On: 10/28/2017 06:26   Dg Chest [facility] 1 View  Result  Date: 10/29/2017 CLINICAL DATA:  Acute respiratory failure EXAM: PORTABLE CHEST 1 VIEW COMPARISON:  Yesterday FINDINGS: Endotracheal  tube tip just below the clavicular heads. An orogastric tube reaches the stomach. Mild interstitial coarsening. No edema, effusion, or pneumothorax. Normal heart size. IMPRESSION: 1. Stable positioning of endotracheal and orogastric tubes. 2. Stable inflation. Electronically Signed   By: Monte Fantasia M.D.   On: 10/29/2017 07:45   Portable Chest Xray  Result Date: 10/28/2017 CLINICAL DATA:  Hypoxia EXAM: PORTABLE CHEST 1 VIEW COMPARISON:  October 27, 2017 FINDINGS: Endotracheal tube tip is 5.1 cm above the carina. Nasogastric tube tip and side port are below the diaphragm. No pneumothorax. There is no edema or consolidation. Heart size and pulmonary vascularity are normal. No adenopathy. There is aortic atherosclerosis. No evident bone lesions. IMPRESSION: Tube positions as described without evident pneumothorax. No edema or consolidation. There is aortic atherosclerosis. Aortic Atherosclerosis (ICD10-I70.0). Electronically Signed   By: Lowella Grip III M.D.   On: 10/28/2017 09:38   Dg Chest Port 1 View  Result Date: 10/28/2017 CLINICAL DATA:  Hypoxia EXAM: PORTABLE CHEST 1 VIEW COMPARISON:  March 18, 2015 FINDINGS: Endotracheal tube tip is 5.1 cm above the carina. Nasogastric tube tip and side port are below the diaphragm. No pneumothorax. There is a small left pleural effusion with mild left base atelectasis. Lungs elsewhere clear. Heart is upper normal in size with pulmonary vascularity within normal limits. There is aortic atherosclerosis. No adenopathy. No bone lesions. IMPRESSION: Tube positions as described without pneumothorax. Small left effusion with mild left base atelectasis. Lungs elsewhere clear. Stable cardiac silhouette. There is aortic atherosclerosis. Aortic Atherosclerosis (ICD10-I70.0). Electronically Signed   By: Lowella Grip III M.D.   On: 10/26/2017 13:08   Dg Abd Portable 1v  Result Date: 10/28/2017 CLINICAL DATA:  Orogastric tube placement EXAM: PORTABLE ABDOMEN - 1  VIEW COMPARISON:  None. FINDINGS: Nasogastric tube tip and side port are in the stomach. There is a paucity of gas. There is no bowel dilatation or air-fluid level to suggest bowel obstruction. No free air. IMPRESSION: Orogastric tube tip and side port in body of stomach. Paucity of gas may be seen normally but also may be indicative of early ileus or enteritis. No free air evident. Electronically Signed   By: Lowella Grip III M.D.   On: 10/28/2017 09:37   Ct Head Code Stroke Wo Contrast  Result Date: 10/12/2017 CLINICAL DATA:  Code stroke.  Altered mental status.  Fall. EXAM: CT HEAD WITHOUT CONTRAST TECHNIQUE: Contiguous axial images were obtained from the base of the skull through the vertex without intravenous contrast. COMPARISON:  10/26/2017 FINDINGS: Brain: There is a large, predominantly hyperdense subdural hematoma diffusely over the left cerebral convexity which measures up to approximately 3 cm in thickness. There is prominent mass effect on the left cerebral hemisphere with diffuse sulcal effacement, partial effacement of the lateral and third ventricles, and 2.6 cm of rightward midline shift. There is new mild dilatation of the atrium and occipital and temporal horns of the right lateral ventricle. A small amount of subdural blood extends along the falx and left tentorium. There is also a new 3 cm parenchymal hemorrhage in the left temporal lobe with surrounding vasogenic edema. There is left uncal herniation, mass effect on the midbrain with effacement of the ambient cistern, and effacement of the suprasellar cistern. No acute large territory vascular arterial infarct is identified. Vascular: Calcified atherosclerosis at the skull base. Skull: No fracture or focal osseous lesion. Sinuses/Orbits:  Scattered mild paranasal sinus mucosal thickening. Clear mastoid air cells. Bilateral cataract extraction. Other: None. ASPECTS [facility] Stroke Program Early CT Score) Not scored due to the presence of  hemorrhage. IMPRESSION: 1. Large subdural hematoma over the left cerebral convexity. Prominent mass effect with uncal herniation, 2.6 cm of rightward midline shift, and trapping of the right lateral ventricle. 2. 3 cm hemorrhagic contusion in the left temporal lobe with surrounding edema. Critical Value/emergent results were called by telephone at the time of interpretation on 11/05/2017 at 12:57 pm to Dr. Jola Schmidt , who verbally acknowledged these results. Electronically Signed   By: Logan Bores M.D.   On: 11/10/2017 12:58    Assessment/Plan: Postop day #2: I will plan to discontinue his JP drain tomorrow.  We will continue supportive care.  I have answered all the patient's wife's and daughter's questions.  LOS: 2 days     Ophelia Charter 10/29/2017, 9:07 AM

## 2017-10-30 ENCOUNTER — Inpatient Hospital Stay (HOSPITAL_COMMUNITY): Payer: Medicare HMO

## 2017-10-30 LAB — GLUCOSE, CAPILLARY
GLUCOSE-CAPILLARY: 256 mg/dL — AB (ref 65–99)
GLUCOSE-CAPILLARY: 173 mg/dL — AB (ref 65–99)
GLUCOSE-CAPILLARY: 225 mg/dL — AB (ref 65–99)
Glucose-Capillary: 171 mg/dL — ABNORMAL HIGH (ref 65–99)
Glucose-Capillary: 189 mg/dL — ABNORMAL HIGH (ref 65–99)
Glucose-Capillary: 194 mg/dL — ABNORMAL HIGH (ref 65–99)
Glucose-Capillary: 205 mg/dL — ABNORMAL HIGH (ref 65–99)

## 2017-10-30 LAB — CBC WITH DIFFERENTIAL/PLATELET
BASOS ABS: 0 10*3/uL (ref 0.0–0.1)
BASOS PCT: 0 %
EOS ABS: 0 10*3/uL (ref 0.0–0.7)
Eosinophils Relative: 0 %
HEMATOCRIT: 29.9 % — AB (ref 39.0–52.0)
Hemoglobin: 9.7 g/dL — ABNORMAL LOW (ref 13.0–17.0)
LYMPHS ABS: 1.1 10*3/uL (ref 0.7–4.0)
LYMPHS PCT: 10 %
MCH: 33.4 pg (ref 26.0–34.0)
MCHC: 32.4 g/dL (ref 30.0–36.0)
MCV: 103.1 fL — ABNORMAL HIGH (ref 78.0–100.0)
MONOS PCT: 8 %
Monocytes Absolute: 0.8 10*3/uL (ref 0.1–1.0)
NEUTROS ABS: 8.7 10*3/uL — AB (ref 1.7–7.7)
Neutrophils Relative %: 82 %
Platelets: 143 10*3/uL — ABNORMAL LOW (ref 150–400)
RBC: 2.9 MIL/uL — ABNORMAL LOW (ref 4.22–5.81)
RDW: 14.1 % (ref 11.5–15.5)
WBC: 10.6 10*3/uL — ABNORMAL HIGH (ref 4.0–10.5)

## 2017-10-30 LAB — BASIC METABOLIC PANEL
Anion gap: 9 (ref 5–15)
BUN: 26 mg/dL — AB (ref 6–20)
CALCIUM: 8.1 mg/dL — AB (ref 8.9–10.3)
CO2: 25 mmol/L (ref 22–32)
CREATININE: 0.87 mg/dL (ref 0.61–1.24)
Chloride: 111 mmol/L (ref 101–111)
GFR calc Af Amer: >60 mL/min (ref >60)
GFR calc non Af Amer: >60 mL/min (ref >60)
GLUCOSE: 216 mg/dL — AB (ref 65–99)
Potassium: 3.6 mmol/L (ref 3.5–5.1)
Sodium: 145 mmol/L (ref 135–145)

## 2017-10-30 LAB — MAGNESIUM: Magnesium: 1.6 mg/dL — ABNORMAL LOW (ref 1.7–2.4)

## 2017-10-30 LAB — PHOSPHORUS: Phosphorus: 1.9 mg/dL — ABNORMAL LOW (ref 2.5–4.6)

## 2017-10-30 MED ORDER — DEXMEDETOMIDINE HCL IN NACL 200 MCG/50ML IV SOLN: 0.2000 ug/kg/h | INTRAVENOUS | Status: DC

## 2017-10-30 MED ORDER — FOLIC ACID 1 MG PO TABS
1.0000 mg | ORAL_TABLET | Freq: Every day | ORAL | Status: DC
  Administered 2017-10-30 – 2017-11-02 (×4): 1 mg via ORAL
  Filled 2017-10-30 (×4): qty 1

## 2017-10-30 MED ORDER — INSULIN ASPART 100 UNIT/ML ~~LOC~~ SOLN
8.0000 [IU] | SUBCUTANEOUS | Status: DC
  Administered 2017-10-30 – 2017-11-03 (×23): 8 [IU] via SUBCUTANEOUS

## 2017-10-30 MED ORDER — BISACODYL 10 MG RE SUPP
10.0000 mg | Freq: Every day | RECTAL | Status: DC | PRN
  Administered 2017-10-30: 10 mg via RECTAL
  Filled 2017-10-30: qty 1

## 2017-10-30 MED ORDER — VITAMIN B-1 100 MG PO TABS
100.0000 mg | ORAL_TABLET | Freq: Every day | ORAL | Status: DC
  Administered 2017-10-30 – 2017-11-02 (×4): 100 mg via ORAL
  Filled 2017-10-30 (×4): qty 1

## 2017-10-30 NOTE — Progress Notes (Signed)
Subjective: The patient is sedated and comatose.  He is in no apparent distress.  Objective: Vital signs in last 24 hours: Temp:  [98.6 F (37 C)-100.6 F (38.1 C)] 99.5 F (37.5 C) (01/20 0800) Pulse Rate:  [78-135] 112 (01/20 0730) Resp:  [12-23] 17 (01/20 0730) BP: (114-173)/(59-133) 161/78 (01/20 0730) SpO2:  [99 %-100 %] 100 % (01/20 0730) Arterial Line BP: (102-115)/(77-97) 114/97 (01/19 0930) FiO2 (%):  [35 %] 35 % (01/20 0400) Weight:  [114 kg (251 lb 5.2 oz)] 114 kg (251 lb 5.2 oz) (01/20 0500) Estimated body mass index is 36.06 kg/m as calculated from the following:   Height as of this encounter: 5\' 10"  (1.778 m).   Weight as of this encounter: 114 kg (251 lb 5.2 oz).   Intake/Output from previous day: 01/19 0701 - 01/20 0700 In: 4030 [I.V.:2400; NG/GT:960; IV Piggyback:670] Out: 9381 [Urine:1475; Drains:230] Intake/Output this shift: No intake/output data recorded.  Physical exam the patient is sedated and comatose.  He will weakly flex to pain.  His JP was draining mildly bloody spinal fluid.  I removed it.  He had a approximately 5-second right facial focal seizure.  Lab Results: Recent Labs    10/28/17 0406 10/29/17 0520  WBC 17.6* 14.3*  HGB 11.5* 10.1*  HCT 34.0* 30.1*  PLT 136* 120*   BMET Recent Labs    10/28/17 0406 10/29/17 0520  NA 141 142  K 3.2* 3.5  CL 104 109  CO2 23 23  GLUCOSE 188* 257*  BUN 22* 26*  CREATININE 0.99 0.86  CALCIUM 8.3* 8.0*    Studies/Results: Dg Chest Port 1 View  Result Date: 10/29/2017 CLINICAL DATA:  Acute respiratory failure EXAM: PORTABLE CHEST 1 VIEW COMPARISON:  Yesterday FINDINGS: Endotracheal tube tip just below the clavicular heads. An orogastric tube reaches the stomach. Mild interstitial coarsening. No edema, effusion, or pneumothorax. Normal heart size. IMPRESSION: 1. Stable positioning of endotracheal and orogastric tubes. 2. Stable inflation. Electronically Signed   By: Monte Fantasia M.D.   On:  10/29/2017 07:45    Assessment/Plan: Postop day #3: The patient has not improved much.  He is having occasional brief focal seizures and is on Keppra.  The patient's family tells me he drinks quite a bit.  I will add thiamine and folate.  We will continue supportive care.  I have answered all their questions.  LOS: 3 days     Ophelia Charter 10/30/2017, 8:30 AM     Patient ID: [name], male   DOB: 06/16/1941, 77 y.o.   MRN: [id number]

## 2017-10-30 NOTE — Progress Notes (Signed)
PULMONARY / CRITICAL CARE MEDICINE   Name: [name] MRN: [id number] DOB: 1941-08-18    ADMISSION DATE:  11/06/2017 CONSULTATION DATE:  1/17  REFERRING MD:  Ditty   CHIEF COMPLAINT:  Vent management   HISTORY OF PRESENT ILLNESS:   77 year old male with history of DM, hypertension, A. fib on Coumadin, hyperlipidemia presented 1/17 to the ER as code stroke after he was found unresponsive.  Of note he had a fall 1/16 at which time he came to the ER and had negative head CT.  He was discharged home.  Woke up on the morning of admission with a headache and went to bed, was then found unresponsive.  Was found to have large left subdural hematoma with midline shift. KCentra given.  Seen in consultation by neurosurgery and taken urgently to the OR for evacuation of SDH.  He remained intubated postop and critical care consulted for vent and medical management.    SUBJECTIVE:  He has underlying obstructive sleep apnea his current neurological status prevents any consideration of extubation.  Most likely need tracheostomy to be successfully liberated from mechanical ventilatory support.  If his quality life does not improve his family demonstrates consideration to comfort care but not have not totally committed to that course of action.  VITAL SIGNS: BP (!) 162/67   Pulse (!) 132   Temp 99.5 F (37.5 C) (Axillary)   Resp 17   Ht 5\' 10"  (1.778 m)   Wt 114 kg (251 lb 5.2 oz)   SpO2 100%   BMI 36.06 kg/m   HEMODYNAMICS:    VENTILATOR SETTINGS: Vent Mode: PSV FiO2 (%):  [35 %] 35 % Set Rate:  [15 bmp] 15 bmp Vt Set:  [567 mL-580 mL] 580 mL PEEP:  [5 cmH20] 5 cmH20 Pressure Support:  [5 cmH20] 5 cmH20 Plateau Pressure:  [10 cmH20-15 cmH20] 10 cmH20  INTAKE / OUTPUT:  Intake/Output Summary (Last 24 hours) at 10/30/2017 5732 Last data filed at 10/30/2017 0700 Gross per 24 hour  Intake 3750 ml  Output 1405 ml  Net 2345 ml     PHYSICAL EXAMINATION: General: Morbidly obese  male with ventilator poorly responsive HEENT: Endotracheal tube to ventilator orogastric tube PSY: Available Neuro: Poorly responsive grimaces to noxious stimuli CV: Heart sounds are irregular PULM: even/non-labored, lungs bilaterally coarse rhonchi KG:URKY, non-tender, bsx4 active obese Extremities: warm/dry, 2+ edema areas of ecchymosis Skin: no rashes or lesions    LABS:  BMET Recent Labs  Lab 11/06/2017 1228 10/24/2017 1253 10/28/2017 1438 10/26/2017 1500 10/28/17 0406 10/29/17 0520  NA 140 142 142 142 141 142  K 4.3 4.3 4.2 4.3 3.2* 3.5  CL 101 99*  --   --  104 109  CO2 25  --   --   --  23 23  BUN 22* 25*  --   --  22* 26*  CREATININE 1.10 0.90  --   --  0.99 0.86  GLUCOSE 248* 248* 261*  --  188* 257*    Electrolytes Recent Labs  Lab 10/19/2017 1228 10/28/17 0406 10/28/17 1027 10/28/17 1523 10/29/17 0520  CALCIUM 9.1 8.3*  --   --  8.0*  MG  --   --  1.0* 1.3* 1.6*  PHOS  --   --  3.0 1.2* 2.1*    CBC Recent Labs  Lab 11/04/2017 1228  10/24/2017 1500 10/28/17 0406 10/29/17 0520  WBC 14.9*  --   --  17.6* 14.3*  HGB 14.1   < > 12.2*  11.5* 10.1*  HCT 40.7   < > 36.0* 34.0* 30.1*  PLT 149*  --   --  136* 120*   < > = values in this interval not displayed.    Coag's Recent Labs  Lab 10/15/2017 1300 10/19/2017 1617 10/28/17 0406 10/29/17 0520  APTT 39*  --   --   --   INR 2.68 1.36 1.19 1.25    Sepsis Markers Recent Labs  Lab 10/17/2017 1255 10/28/17 1027  LATICACIDVEN 2.54*  --   PROCALCITON  --  0.22    ABG Recent Labs  Lab 10/15/2017 1500 10/23/2017 1626 10/29/17 0425  PHART 7.269* 7.355 7.382  PCO2ART 57.3* 46.0 40.3  PO2ART 421.0* 387.0* 213*    Liver Enzymes Recent Labs  Lab 10/17/2017 1228 10/29/17 0520  AST 32 19  ALT 27 14*  ALKPHOS 68 44  BILITOT 2.2* 2.0*  ALBUMIN 3.9 2.9*    Cardiac Enzymes No results for input(s): TROPONINI, PROBNP in the last 168 hours.  Glucose Recent Labs  Lab 10/29/17 1201 10/29/17 1545  10/29/17 2047 10/30/17 0010 10/30/17 0408 10/30/17 0757  GLUCAP 255* 227* 172* 189* 171* 205*    Imaging Dg Chest Port 1 View  Result Date: 10/30/2017 CLINICAL DATA:  Hypoxia EXAM: PORTABLE CHEST 1 VIEW COMPARISON:  October 29, 2017 FINDINGS: Endotracheal tube tip is 4.5 cm above the carina. Nasogastric tube tip and side port are in the stomach. No pneumothorax. There is atelectatic change in the right mid and lower lung zone regions. There is a minimal left pleural effusion. Lungs elsewhere clear. Heart is upper normal in size with pulmonary vascularity within normal limits. There is aortic atherosclerosis. No adenopathy. There is degenerative change in each shoulder. IMPRESSION: Tube positions as described without pneumothorax. Atelectasis is noted in the right mid and lower lung zones. No edema or consolidation. Stable cardiac silhouette. There is aortic atherosclerosis. Aortic Atherosclerosis (ICD10-I70.0). Electronically Signed   By: Lowella Grip III M.D.   On: 10/30/2017 08:33     STUDIES:  Ct head 1/17>>>1. Large subdural hematoma over the left cerebral convexity. Prominent mass effect with uncal herniation, 2.6 cm of rightward midline shift, and trapping of the right lateral ventricle. 2. 3 cm hemorrhagic contusion in the left temporal lobe with surrounding edema.  CULTURES: UC 1/18>>> negative  ANTIBIOTICS: Ancef 1/17 > peri-op   SIGNIFICANT EVENTS: 1/17 OR>>> crani, evacuation of L SDH   LINES/TUBES: ETT 1/17 >> Orogastric tube 11/07/2017>>  DISCUSSION: 77 year old male with large SDH in setting fall on Coumadin.  s/P OR for craniotomy and evacuation of subdural hematoma by neurosurgery.  Remains vented postop.  ASSESSMENT / PLAN:   Large left subdural hematoma with midline shift in setting fall on Coumadin- status post OR for evacuation -follow-up CT head 1/18 without evidence of worsening, expected postoperative changes. Plan RASS goal 0 PAD protocol Physical  therapy when appropriate Serial neuro checks Will obtain EEG rule out subclinical seizure Treat fever JP management per neurosurgery 10/29/2017 INR has been normalized  Acute respiratory failure- remains on vent post neurosurgery for evacuation of subdural hematoma Chest x-ray was personally reviewed this is clear, endotracheal tubes in satisfactory position His mental status will not support extubation at this point Plan Continue full ventilator support Daily chest x-ray while intubated PAD protocol RASS of 0 Wean FiO2 Daily assessment for readiness for weaning Note he has obstructive sleep apnea is been noncompliant for the last 3 years with CPAP. Immediate tracheostomy to be successfully liberated from mechanical  ventilatory support due to his altered mental status from subdural hematoma.  Hypertension Plan: Add as needed labetalol goal systolic blood pressure less than 160  Chronic atrial fibrillation; currently with rapid ventricular response -Anti-coagulation reversed Plan Rate control; adding scheduled Lopressor Continue telemetry monitoring Hold anticoagulation until cleared by surgery to resume Due to frequent falls he may not be a candidate for further anticoagulation  Fluid and electrolyte imbalance: Hypokalemia Lab Results  Component Value Date   CREATININE 0.86 10/29/2017   CREATININE 0.99 10/28/2017   CREATININE 0.90 10/16/2017   Recent Labs  Lab 11/04/2017 1500 10/28/17 0406 10/29/17 0520  K 4.3 3.2* 3.5    Plan Replace and recheck in a.m.  Fever and leukocytosis; suspect this is postop systemic inflammatory response Plan  urine culture negative  procalcitonin 0.22 Treat fever Hold off on antibiotics  Anemia of chronic disease Recent Labs    10/28/17 0406 10/29/17 0520  HGB 11.5* 10.1*    Plan Trend CBC Transfuse per protocol  DM with hyperglycemia CBG (last 3)  Recent Labs    10/30/17 0010 10/30/17 0408 10/30/17 0757  GLUCAP 189*  171* 205*    Plan Sliding scale insulin increased 1/20   FAMILY  - Updates: Wife and daughter updated at bedside 10/30/2017  - Inter-disciplinary family meet or Palliative Care meeting due by:  1/24  DVT prophylaxis: scd SUP: ppi   Diet: start tubefeeds Activity: BR Disposition : Neuro-icu   My cct 3 min   Richardson Landry Adrieana Fennelly ACNP Maryanna Shape PCCM Pager [phone] till 1 pm If no answer page [phone] 10/30/2017, 9:23 AM

## 2017-10-31 ENCOUNTER — Inpatient Hospital Stay (HOSPITAL_COMMUNITY): Payer: Medicare HMO

## 2017-10-31 DIAGNOSIS — R569 Unspecified convulsions: Secondary | ICD-10-CM

## 2017-10-31 DIAGNOSIS — S065X9A Traumatic subdural hemorrhage with loss of consciousness of unspecified duration, initial encounter: Secondary | ICD-10-CM

## 2017-10-31 DIAGNOSIS — Z978 Presence of other specified devices: Secondary | ICD-10-CM

## 2017-10-31 DIAGNOSIS — I10 Essential (primary) hypertension: Secondary | ICD-10-CM

## 2017-10-31 DIAGNOSIS — I482 Chronic atrial fibrillation: Secondary | ICD-10-CM

## 2017-10-31 LAB — POCT I-STAT 3, ART BLOOD GAS (G3+)
Bicarbonate: 24.8 mmol/L (ref 20.0–28.0)
O2 SAT: 97 %
TCO2: 26 mmol/L (ref 22–32)
pCO2 arterial: 39.9 mmHg (ref 32.0–48.0)
pH, Arterial: 7.402 (ref 7.350–7.450)
pO2, Arterial: 94 mmHg (ref 83.0–108.0)

## 2017-10-31 LAB — GLUCOSE, CAPILLARY
GLUCOSE-CAPILLARY: 215 mg/dL — AB (ref 65–99)
GLUCOSE-CAPILLARY: 260 mg/dL — AB (ref 65–99)
GLUCOSE-CAPILLARY: 306 mg/dL — AB (ref 65–99)
Glucose-Capillary: 244 mg/dL — ABNORMAL HIGH (ref 65–99)
Glucose-Capillary: 228 mg/dL — ABNORMAL HIGH (ref 65–99)
Glucose-Capillary: 159 mg/dL — ABNORMAL HIGH (ref 65–99)

## 2017-10-31 LAB — BASIC METABOLIC PANEL
Anion gap: 12 (ref 5–15)
BUN: 27 mg/dL — AB (ref 6–20)
CALCIUM: 8.5 mg/dL — AB (ref 8.9–10.3)
CO2: 25 mmol/L (ref 22–32)
CREATININE: 0.93 mg/dL (ref 0.61–1.24)
Chloride: 112 mmol/L — ABNORMAL HIGH (ref 101–111)
GFR calc Af Amer: >60 mL/min (ref >60)
GLUCOSE: 246 mg/dL — AB (ref 65–99)
Potassium: 3.6 mmol/L (ref 3.5–5.1)
Sodium: 149 mmol/L — ABNORMAL HIGH (ref 135–145)

## 2017-10-31 LAB — CBC WITH DIFFERENTIAL/PLATELET
Basophils Absolute: 0 10*3/uL (ref 0.0–0.1)
Basophils Relative: 0 %
EOS PCT: 1 %
Eosinophils Absolute: 0.1 10*3/uL (ref 0.0–0.7)
HCT: 31.5 % — ABNORMAL LOW (ref 39.0–52.0)
Hemoglobin: 10.1 g/dL — ABNORMAL LOW (ref 13.0–17.0)
LYMPHS ABS: 1.1 10*3/uL (ref 0.7–4.0)
LYMPHS PCT: 9 %
MCH: 33.6 pg (ref 26.0–34.0)
MCHC: 32.1 g/dL (ref 30.0–36.0)
MCV: 104.7 fL — AB (ref 78.0–100.0)
MONO ABS: 1.2 10*3/uL — AB (ref 0.1–1.0)
MONOS PCT: 11 %
Neutro Abs: 8.8 10*3/uL — ABNORMAL HIGH (ref 1.7–7.7)
Neutrophils Relative %: 79 %
PLATELETS: 167 10*3/uL (ref 150–400)
RBC: 3.01 MIL/uL — ABNORMAL LOW (ref 4.22–5.81)
RDW: 14.7 % (ref 11.5–15.5)
WBC: 11.2 10*3/uL — ABNORMAL HIGH (ref 4.0–10.5)

## 2017-10-31 LAB — PROTIME-INR
INR: 1.17
Prothrombin Time: 14.8 seconds (ref 11.4–15.2)

## 2017-10-31 LAB — PHENYTOIN LEVEL, TOTAL: PHENYTOIN LVL: 15.5 ug/mL (ref 10.0–20.0)

## 2017-10-31 LAB — MAGNESIUM: Magnesium: 1.8 mg/dL (ref 1.7–2.4)

## 2017-10-31 LAB — PHOSPHORUS: Phosphorus: 2.9 mg/dL (ref 2.5–4.6)

## 2017-10-31 MED ORDER — FUROSEMIDE 10 MG/ML IJ SOLN
40.0000 mg | Freq: Two times a day (BID) | INTRAMUSCULAR | Status: AC
  Administered 2017-10-31 (×2): 40 mg via INTRAVENOUS
  Filled 2017-10-31 (×2): qty 4

## 2017-10-31 MED ORDER — SODIUM CHLORIDE 0.9 % IV SOLN
1250.0000 mg | Freq: Two times a day (BID) | INTRAVENOUS | Status: DC
  Filled 2017-10-31: qty 12.5

## 2017-10-31 MED ORDER — LORAZEPAM 2 MG/ML IJ SOLN
2.0000 mg | Freq: Once | INTRAMUSCULAR | Status: AC
  Administered 2017-10-31: 2 mg via INTRAVENOUS

## 2017-10-31 MED ORDER — LABETALOL HCL 5 MG/ML IV SOLN
20.0000 mg | INTRAVENOUS | Status: DC | PRN
  Administered 2017-10-31 – 2017-11-02 (×6): 20 mg via INTRAVENOUS
  Filled 2017-10-31 (×6): qty 4

## 2017-10-31 MED ORDER — PHENYTOIN SODIUM 50 MG/ML IJ SOLN
100.0000 mg | Freq: Three times a day (TID) | INTRAMUSCULAR | Status: DC
  Administered 2017-10-31 – 2017-11-03 (×8): 100 mg via INTRAVENOUS
  Filled 2017-10-31 (×9): qty 2

## 2017-10-31 MED ORDER — INSULIN GLARGINE 100 UNIT/ML ~~LOC~~ SOLN
10.0000 [IU] | Freq: Every day | SUBCUTANEOUS | Status: DC
  Administered 2017-10-31 – 2017-11-02 (×3): 10 [IU] via SUBCUTANEOUS
  Filled 2017-10-31 (×3): qty 0.1

## 2017-10-31 MED ORDER — LORAZEPAM 2 MG/ML IJ SOLN
INTRAMUSCULAR | Status: AC
  Administered 2017-10-31: 2 mg via INTRAVENOUS
  Filled 2017-10-31: qty 1

## 2017-10-31 MED ORDER — SODIUM CHLORIDE 0.9 % IV SOLN
0.0000 ug/min | INTRAVENOUS | Status: DC
  Filled 2017-10-31: qty 1

## 2017-10-31 MED ORDER — POTASSIUM CHLORIDE 20 MEQ/15ML (10%) PO SOLN
40.0000 meq | Freq: Two times a day (BID) | ORAL | Status: AC
  Administered 2017-10-31 (×2): 40 meq
  Filled 2017-10-31 (×2): qty 30

## 2017-10-31 MED ORDER — SODIUM CHLORIDE 0.9 % IV SOLN
1500.0000 mg | Freq: Two times a day (BID) | INTRAVENOUS | Status: DC
  Administered 2017-10-31 – 2017-11-03 (×6): 1500 mg via INTRAVENOUS
  Filled 2017-10-31 (×6): qty 15

## 2017-10-31 MED ORDER — HYDRALAZINE HCL 20 MG/ML IJ SOLN
10.0000 mg | INTRAMUSCULAR | Status: DC | PRN
  Administered 2017-11-01: 20 mg via INTRAVENOUS
  Filled 2017-10-31: qty 1

## 2017-10-31 MED ORDER — SODIUM CHLORIDE 0.9 % IV SOLN
1000.0000 mg | INTRAVENOUS | Status: AC
  Administered 2017-10-31: 1000 mg via INTRAVENOUS
  Filled 2017-10-31: qty 10

## 2017-10-31 MED ORDER — SODIUM CHLORIDE 0.9 % IV SOLN
200.0000 mg | Freq: Two times a day (BID) | INTRAVENOUS | Status: DC
  Administered 2017-10-31 – 2017-11-03 (×6): 200 mg via INTRAVENOUS
  Filled 2017-10-31 (×9): qty 20

## 2017-10-31 MED ORDER — SODIUM CHLORIDE 0.9 % IV SOLN
200.0000 mg | Freq: Once | INTRAVENOUS | Status: AC
  Administered 2017-10-31: 200 mg via INTRAVENOUS
  Filled 2017-10-31: qty 20

## 2017-10-31 MED ORDER — LOSARTAN POTASSIUM 50 MG PO TABS
100.0000 mg | ORAL_TABLET | Freq: Every day | ORAL | Status: DC
  Administered 2017-10-31 – 2017-11-02 (×3): 100 mg via ORAL
  Filled 2017-10-31 (×5): qty 2

## 2017-10-31 MED ORDER — SODIUM CHLORIDE 0.9 % IV BOLUS (SEPSIS)
500.0000 mL | Freq: Once | INTRAVENOUS | Status: AC
  Administered 2017-10-31: 500 mL via INTRAVENOUS

## 2017-10-31 MED ORDER — LORAZEPAM 2 MG/ML IJ SOLN
2.0000 mg | Freq: Once | INTRAMUSCULAR | Status: AC
  Administered 2017-10-31: 2 mg via INTRAVENOUS
  Filled 2017-10-31: qty 1

## 2017-10-31 MED ORDER — SODIUM CHLORIDE 0.9 % IV SOLN
1500.0000 mg | Freq: Once | INTRAVENOUS | Status: AC
  Administered 2017-10-31: 1500 mg via INTRAVENOUS
  Filled 2017-10-31: qty 30

## 2017-10-31 MED ORDER — LORAZEPAM 2 MG/ML IJ SOLN
0.0000 mg | INTRAMUSCULAR | Status: DC | PRN
  Administered 2017-11-02: 2 mg via INTRAVENOUS
  Filled 2017-10-31: qty 1

## 2017-10-31 NOTE — Procedures (Signed)
ELECTROENCEPHALOGRAM REPORT  Date of Study: 10/31/2017  Patient's Name: [name] MRN: [id number] Date of Birth: 20-Jun-1941  Referring Provider: Dr. Amie Portland  Clinical History: This is a 78 year old man s/p craniotomy for left SDH, with episode of eyelid and right facial twitching.  Medications: Keppra Ativan Lasix Hydralazine Insulin Losartan  Technical Summary: A multichannel digital EEG recording measured by the international 10-20 system with electrodes applied with paste and impedances below 5000 ohms performed as portable with EKG monitoring in an intubated and unresponsive patient.  Hyperventilation and photic stimulation were not performed.  The digital EEG was referentially recorded, reformatted, and digitally filtered in a variety of bipolar and referential montages for optimal display.   Description: The patient is intubated and unresposnive during the recording. There is no clear posterior dominant rhythm seen. The background consists of a moderate amount of diffuse theta and delta slowing admixed with diffuse alpha activity. There is loss of faster frequencies over the left hemisphere. There are lateralized periodic discharges seen over the left mid-temporal region occurring every 0.5 to 4 seconds. There is one electrographic seizure captured lasting 60 seconds with evolution in frequency and amplitude over the left hemisphere, maximal over the left temporal region with no clear clinical correlate seen. This is followed by suppression over the left hemisphere. Normal sleep architecture is not seen. Hyperventilation and photic stimulation were not performed. .  EKG lead showed tachycardia with irregular rhythm.  Impression: This EEG is abnormal due to the presence of: 1. Mild to moderate diffuse background slowing 2. Additional slowing over the left hemisphere 3. Lateralized periodic discharges over the left mid-temporal region 4. Electrographic seizure  arising from the left temporal region lasting 60 seconds  Clinical Correlation of the above findings indicates diffuse cerebral dysfunction that is non-specific in etiology and can be seen with hypoxic/ischemic injury, toxic/metabolic encephalopathies, or medication effect. Additional focal slowing over the left hemisphere indicates focal cerebral dysfunction suggestive of underlying structural or physiologic abnormality. Lateralized periodic discharges are commonly associated with an acute brain lesion and clinical focal seizures, with one electrographic seizure arising from the left temporal region captured in this study. Long-term EEG is recommended.   Ellouise Newer, M.D.

## 2017-10-31 NOTE — Progress Notes (Signed)
OT Cancellation    10/31/17 1600  OT Visit Information  Last OT Received On 10/31/17  Reason Eval/Treat Not Completed Patient not medically ready. Pt with active seizures and currently on continuous EEG. Will return tomorrow for OT eval and as pt is medically ready. Thank you.   [place], OTR/L Acute Rehab Pager: [phone] Office: [phone]

## 2017-10-31 NOTE — Consult Note (Addendum)
NEURO HOSPITALIST CONSULT NOTE   Requestig physician: P CCM   Reason for Consult: Seizure   History obtained from: Chart and family  HPI:                                                                                                                                          [name] is an 77 y.o. male was initially brought to [facility] for a code stroke secondary to being found unresponsive.  Initially he was noted to have a fall the day before however upon arriving at [facility] it was evident he had a large subdural hematoma on the left.  Patient underwent a number see craniotomy to relieve the subdural, started on Keppra 500 mg twice daily for seizure prophylaxis, given K Centra for the Coumadin reader reversal, and then admitted to the ICU.  This is status post a 5 and patient was noted to have bilateral eyelid twitching right facial twitching that was ceased by the administration of Ativan.  P CCM consulted neurology for further care and recommendations for seizure management.  Patient's Keppra was increased from 1000 twice daily to 1250 twice daily and loaded with phenytoin 1500 mg 1 time.  Currently patient is breathing over the vent showing no active seizure activity.  However he does have triple reflex bilaterally in the lower extremities.  He is on very little sedation.  He is on 50 mcg of fentanyl.  Past Medical History:  Diagnosis Date  . Diabetes mellitus   . Diverticulosis   . Hyperlipidemia   . Hypertension   . Type 2 diabetes mellitus ([place])     Past Surgical History:  Procedure Laterality Date  . ACHILLES TENDON SURGERY     L  . CRANIOTOMY Left 10/20/2017   Procedure: CRANIOTOMY HEMATOMA EVACUATION SUBDURAL;  Surgeon: Ditty, Kevan Ny, MD;  Location: [place];  Service: Neurosurgery;  Laterality: Left;  . TONSILLECTOMY      Family History  Problem Relation Age of Onset  . Cancer Father        pancreatic  . Heart  disease Mother   . Cancer Mother   . Cancer Other        cervical     Social History:  reports that he quit smoking about 33 years ago. He has a 7.50 pack-year smoking history. He does not have any smokeless tobacco history on file. He reports that he drinks about 7.0 oz of alcohol per week. He reports that he does not use drugs.  No Known Allergies  MEDICATIONS:  Scheduled: . chlorhexidine gluconate (MEDLINE KIT)  15 mL Mouth Rinse BID  . docusate  100 mg Oral BID  . feeding supplement (PRO-STAT SUGAR FREE 64)  30 mL Per Tube 5 X Daily  . feeding supplement (VITAL HIGH PROTEIN)  1,000 mL Per Tube Q24H  . finasteride  5 mg Oral Daily  . folic acid  1 mg Oral Daily  . furosemide  40 mg Intravenous Q12H  . insulin aspart  0-20 Units Subcutaneous Q4H  . insulin aspart  8 Units Subcutaneous Q4H  . insulin glargine  10 Units Subcutaneous QHS  . losartan  100 mg Oral Daily  . magnesium oxide  400 mg Per Tube Daily  . mouth rinse  15 mL Mouth Rinse Q2H  . metoprolol tartrate  100 mg Per Tube BID  . pantoprazole (PROTONIX) IV  40 mg Intravenous QHS  . potassium chloride  40 mEq Per Tube BID  . senna  1 tablet Oral BID  . thiamine  100 mg Oral Daily  . vitamin C  500 mg Oral QODAY   Continuous: .  ceFAZolin (ANCEF) IV 2 g (10/31/17 1039)  . dexmedetomidine (PRECEDEX) IV infusion    . fosPHENYtoin (CEREBYX) IV    . levETIRAcetam     JKD:TOIZTIWPYKDXI (TYLENOL) oral liquid 160 mg/5 mL, bisacodyl, fentaNYL (SUBLIMAZE) injection, hydrALAZINE, labetalol, LORazepam, naLOXone (NARCAN)  injection, ondansetron **OR** ondansetron (ZOFRAN) IV, promethazine, sodium phosphate   ROS:                                                                                                                                       History obtained from unobtainable from patient due to mental  status     Blood pressure (!) 157/86, pulse (!) 113, temperature 98.5 F (36.9 C), temperature source Axillary, resp. rate 14, height 5' 10"  (1.778 m), weight 113.9 kg (251 lb 1.7 oz), SpO2 98 %.   General Examination:                                                                                                       Physical Exam  HEENT-staples present along midline curving over to the left lateral side of scalp secondary to craniotomy Cardiovascular-pulse irregularly irregular Lungs-no rhonchi or wheezing noted, no excessive working breathing.  Saturations within normal limits Abdomen- All 4 quadrants palpated and nontender Extremities- Warm, dry and intact--with triple reflex bilaterally in the lower extremities when touched Musculoskeletal-no joint tenderness, deformity or swelling Skin-warm and  dry, no hyperpigmentation, vitiligo, or suspicious lesions   Neurological exam Mental Status: Patient does not respond to verbal stimuli.  Does not respond to deep sternal rub.  Does not follow commands.  No verbalizations noted.  Cranial Nerves: II: patient does not respond confrontation bilaterally, pupils right 2 mm, left 2 mm,and sluggishly reactive bilaterally III,IV,VI: doll's response absent bilaterally.  V,VII: corneal reflex present bilaterally  VIII: patient does not respond to verbal stimuli IX,X: gag reflex present, XI: trapezius strength unable to test bilaterally XII: tongue strength unable to test Motor: Extremities flaccid throughout.  As noted bilateral triple reflex when stimulated  sensory: Does not respond to noxious stimuli in any extremity. Deep Tendon Reflexes:  Absent throughout. Plantars: upgoing bilaterally Cerebellar: Unable to perform        Lab Results: Basic Metabolic Panel: Recent Labs  Lab 10/30/2017 1228 10/11/2017 1253 10/22/2017 1438 10/20/2017 1500 10/28/17 0406 10/28/17 1027 10/28/17 1523 10/29/17 0520 10/30/17 0851 10/31/17 0428   NA 140 142 142 142 141  --   --  142 145 149*  K 4.3 4.3 4.2 4.3 3.2*  --   --  3.5 3.6 3.6  CL 101 99*  --   --  104  --   --  109 111 112*  CO2 25  --   --   --  23  --   --  23 25 25   GLUCOSE 248* 248* 261*  --  188*  --   --  257* 216* 246*  BUN 22* 25*  --   --  22*  --   --  26* 26* 27*  CREATININE 1.10 0.90  --   --  0.99  --   --  0.86 0.87 0.93  CALCIUM 9.1  --   --   --  8.3*  --   --  8.0* 8.1* 8.5*  MG  --   --   --   --   --  1.0* 1.3* 1.6* 1.6* 1.8  PHOS  --   --   --   --   --  3.0 1.2* 2.1* 1.9* 2.9    Liver Function Tests: Recent Labs  Lab 10/26/2017 1228 10/29/17 0520  AST 32 19  ALT 27 14*  ALKPHOS 68 44  BILITOT 2.2* 2.0*  PROT 6.4* 5.4*  ALBUMIN 3.9 2.9*   No results for input(s): LIPASE, AMYLASE in the last 168 hours. No results for input(s): AMMONIA in the last 168 hours.  CBC: Recent Labs  Lab 10/12/2017 1228  10/25/2017 1500 10/28/17 0406 10/29/17 0520 10/30/17 0851 10/31/17 0428  WBC 14.9*  --   --  17.6* 14.3* 10.6* 11.2*  NEUTROABS 13.1*  --   --   --   --  8.7* 8.8*  HGB 14.1   < > 12.2* 11.5* 10.1* 9.7* 10.1*  HCT 40.7   < > 36.0* 34.0* 30.1* 29.9* 31.5*  MCV 96.9  --   --  98.6 100.3* 103.1* 104.7*  PLT 149*  --   --  136* 120* 143* 167   < > = values in this interval not displayed.    Cardiac Enzymes: No results for input(s): CKTOTAL, CKMB, CKMBINDEX, TROPONINI in the last 168 hours.  Lipid Panel: No results for input(s): CHOL, TRIG, HDL, CHOLHDL, VLDL, LDLCALC in the last 168 hours.  CBG: Recent Labs  Lab 10/30/17 1537 10/30/17 2046 10/30/17 2305 10/31/17 0319 10/31/17 0826  GLUCAP 194* 173* 225* 244* 228*    Microbiology: Results  for orders placed or performed during the hospital encounter of 10/31/2017  MRSA PCR Screening     Status: None   Collection Time: 10/22/2017  5:21 PM  Result Value Ref Range Status   MRSA by PCR NEGATIVE NEGATIVE Final    Comment:        The GeneXpert MRSA Assay (FDA approved for NASAL  specimens only), is one component of a comprehensive MRSA colonization surveillance program. It is not intended to diagnose MRSA infection nor to guide or monitor treatment for MRSA infections.   Culture, Urine     Status: None   Collection Time: 10/28/17 10:27 AM  Result Value Ref Range Status   Specimen Description URINE, CATHETERIZED  Final   Special Requests NONE  Final   Culture NO GROWTH  Final   Report Status 10/29/2017 FINAL  Final    Coagulation Studies: Recent Labs    10/29/17 0520  LABPROT 15.6*  INR 1.25    Imaging: Dg Chest Port 1 View  Result Date: 10/31/2017 CLINICAL DATA:  Respiratory failure. History of hypertension and diabetes. EXAM: PORTABLE CHEST 1 VIEW COMPARISON:  10/30/2017; 10/29/2017; 10/28/2017 FINDINGS: Grossly unchanged enlarged cardiac silhouette and mediastinal contours. Atherosclerotic plaque within the thoracic aorta. Stable position of support apparatus including tip of enteric tube regional to the gastroesophageal junction. No pneumothorax. Pulmonary vasculature remains indistinct with cephalization of flow. No new focal airspace opacities. No pleural effusion or pneumothorax. No acute osseus abnormalities. IMPRESSION: 1.  Stable positioning of support apparatus.  No pneumothorax. 2. Similar findings of mild pulmonary edema without interval change. Electronically Signed   By: Sandi Mariscal M.D.   On: 10/31/2017 07:31   Dg Chest Port 1 View  Result Date: 10/30/2017 CLINICAL DATA:  Hypoxia EXAM: PORTABLE CHEST 1 VIEW COMPARISON:  October 29, 2017 FINDINGS: Endotracheal tube tip is 4.5 cm above the carina. Nasogastric tube tip and side port are in the stomach. No pneumothorax. There is atelectatic change in the right mid and lower lung zone regions. There is a minimal left pleural effusion. Lungs elsewhere clear. Heart is upper normal in size with pulmonary vascularity within normal limits. There is aortic atherosclerosis. No adenopathy. There is  degenerative change in each shoulder. IMPRESSION: Tube positions as described without pneumothorax. Atelectasis is noted in the right mid and lower lung zones. No edema or consolidation. Stable cardiac silhouette. There is aortic atherosclerosis. Aortic Atherosclerosis (ICD10-I70.0). Electronically Signed   By: Lowella Grip III M.D.   On: 10/30/2017 08:33       Assessment and plan per attending neurologist  Etta Quill PA-C [facility] [phone]  10/31/2017, 10:40 AM  Attending Neurohospitalist Addendum Patient seen and examined with APP/Resident. Agree with the history and physical as documented above. Independently reviewed imaging. CTH with SDD and 7.57m shift.  Assessment/Plan: 77year old male status post left subdural hematoma day 5 status post craniotomy.  Now showing signs of seizure activity.  At current time patient has been given 2 mg diazepam which stopped the clinically visual seizures.  It is unsure patient is now subclinically seizing.  IMP: Eval for status epilepticus SDH Resp failure   Recommend -Spot EEG if negative may consider LTM -Continue to evaluate for further clinical seizures -Have administered bolus of phenytoin 1500 mg x1 along with increasing his Keppra to 1250 mg twice daily -Seizure precautions -We will monitor patient's Dilantin level in 2 hours to evaluate level if need to start Dilantin.  Discussed plan in detail with family at bedside.  CRITICAL CARE ATTESTATION This patient is critically ill and at significant risk of neurological worsening, death and care requires constant monitoring of vital signs, hemodynamics,respiratory and cardiac monitoring. I spent 35  minutes of neurocritical care time performing neurological assessment, discussion with family, other specialists and medical decision making of high complexityin the care of  this patient.

## 2017-10-31 NOTE — Progress Notes (Addendum)
PULMONARY / CRITICAL CARE MEDICINE   Name: [name] MRN: [id number] DOB: 1941/07/11    ADMISSION DATE:  11/01/2017 CONSULTATION DATE:  1/17  REFERRING MD:  Ditty   CHIEF COMPLAINT:  Vent management   HISTORY OF PRESENT ILLNESS:   77 year old male with history of DM, hypertension, A. fib on Coumadin, hyperlipidemia presented 1/17 to the ER as code stroke after he was found unresponsive.  Of note he had a fall 1/16 at which time he came to the ER and had negative head CT.  He was discharged home.  Woke up on the morning of admission with a headache and went to bed, was then found unresponsive.  Was found to have large left subdural hematoma with midline shift. KCentra given.  Seen in consultation by neurosurgery and taken urgently to the OR for evacuation of SDH.  He remained intubated postop and critical care consulted for vent and medical management.    SUBJECTIVE:  Having intermittent seizures of mouth, face, left foot.  Was weaning this AM on PS 5/5, but switched back to [facility] after another seizure. Remains unresponsive despite no sedation. Family interested in meeting with palliative care to assist with goals of care planning   VITAL SIGNS: BP (!) 157/86   Pulse (!) 107   Temp 98.5 F (36.9 C) (Axillary)   Resp 18   Ht 5\' 10"  (1.778 m)   Wt 113.9 kg (251 lb 1.7 oz)   SpO2 97%   BMI 36.03 kg/m   HEMODYNAMICS:    VENTILATOR SETTINGS: Vent Mode: PSV;CPAP FiO2 (%):  [30 %-35 %] 30 % Set Rate:  [15 bmp] 15 bmp Vt Set:  [580 mL] 580 mL PEEP:  [5 cmH20] 5 cmH20 Pressure Support:  [5 cmH20] 5 cmH20 Plateau Pressure:  [11 cmH20-19 cmH20] 11 cmH20  INTAKE / OUTPUT:  Intake/Output Summary (Last 24 hours) at 10/31/2017 4132 Last data filed at 10/31/2017 0800 Gross per 24 hour  Intake 3630 ml  Output 1725 ml  Net 1905 ml     PHYSICAL EXAMINATION:  General: Morbidly obese male, in NAD HEENT: Scalp staples in place.  PERRL.  MMM.  Twitching to mouth and face  noted. Neuro: Unresponsive, no sedation CV: IRIR, no M/R/G PULM: even/non-labored, lungs bilaterally coarse rhonchi GM:WNUU, non-tender, bsx4 active obese Extremities: warm/dry, 2+ edema areas of ecchymosis Skin: no rashes or lesions   LABS:  BMET Recent Labs  Lab 10/29/17 0520 10/30/17 0851 10/31/17 0428  NA 142 145 149*  K 3.5 3.6 3.6  CL 109 111 112*  CO2 23 25 25   BUN 26* 26* 27*  CREATININE 0.86 0.87 0.93  GLUCOSE 257* 216* 246*    Electrolytes Recent Labs  Lab 10/29/17 0520 10/30/17 0851 10/31/17 0428  CALCIUM 8.0* 8.1* 8.5*  MG 1.6* 1.6* 1.8  PHOS 2.1* 1.9* 2.9    CBC Recent Labs  Lab 10/29/17 0520 10/30/17 0851 10/31/17 0428  WBC 14.3* 10.6* 11.2*  HGB 10.1* 9.7* 10.1*  HCT 30.1* 29.9* 31.5*  PLT 120* 143* 167    Coag's Recent Labs  Lab 11/09/2017 1300 10/31/2017 1617 10/28/17 0406 10/29/17 0520  APTT 39*  --   --   --   INR 2.68 1.36 1.19 1.25    Sepsis Markers Recent Labs  Lab 11/01/2017 1255 10/28/17 1027  LATICACIDVEN 2.54*  --   PROCALCITON  --  0.22    ABG Recent Labs  Lab 10/24/2017 1500 11/01/2017 1626 10/29/17 0425  PHART 7.269* 7.355 7.382  PCO2ART  57.3* 46.0 40.3  PO2ART 421.0* 387.0* 213*    Liver Enzymes Recent Labs  Lab 11/04/2017 1228 10/29/17 0520  AST 32 19  ALT 27 14*  ALKPHOS 68 44  BILITOT 2.2* 2.0*  ALBUMIN 3.9 2.9*    Cardiac Enzymes No results for input(s): TROPONINI, PROBNP in the last 168 hours.  Glucose Recent Labs  Lab 10/30/17 1146 10/30/17 1537 10/30/17 2046 10/30/17 2305 10/31/17 0319 10/31/17 0826  GLUCAP 256* 194* 173* 225* 244* 228*    Imaging Dg Chest Port 1 View  Result Date: 10/31/2017 CLINICAL DATA:  Respiratory failure. History of hypertension and diabetes. EXAM: PORTABLE CHEST 1 VIEW COMPARISON:  10/30/2017; 10/29/2017; 10/28/2017 FINDINGS: Grossly unchanged enlarged cardiac silhouette and mediastinal contours. Atherosclerotic plaque within the thoracic aorta. Stable  position of support apparatus including tip of enteric tube regional to the gastroesophageal junction. No pneumothorax. Pulmonary vasculature remains indistinct with cephalization of flow. No new focal airspace opacities. No pleural effusion or pneumothorax. No acute osseus abnormalities. IMPRESSION: 1.  Stable positioning of support apparatus.  No pneumothorax. 2. Similar findings of mild pulmonary edema without interval change. Electronically Signed   By: Sandi Mariscal M.D.   On: 10/31/2017 07:31    STUDIES:  CT head 1/17 > left SDH, mass effect with uncal herniation, 2.6cm rightward MLS, 3cm hemorrhagic contusion in left temporal lobe. EEG 1/18 > mod generalized low voltage slowing of brain activity.  No non-convulsive status.  CULTURES: UC 1/18>>> negative  ANTIBIOTICS: Ancef 1/17 > peri-op   SIGNIFICANT EVENTS: 1/17 OR>>> crani, evacuation of L SDH  1/21 > seizing more frequently to face, mouth, left foot.  Loaded with fosphenytoin, keppra increased, neuro consulted  LINES/TUBES: ETT 1/17 >> Orogastric tube 10/28/2017>>  DISCUSSION: 77 year old male with large SDH in setting fall on Coumadin.  s/p OR for craniotomy and evacuation of subdural hematoma by neurosurgery.  Remains vented postop. 1/21 having more frequent seizure activity to face, mouth, left foot - consulted neuro, loaded with fosphyenytoin, keppra increased, ativan added PRN.  Family also requesting palliative care to meet with them to assist with goals of care planning.  ASSESSMENT / PLAN:   Large left subdural hematoma with midline shift in setting fall on Coumadin - status post OR for evacuation.  Follow-up CT head 1/18 without evidence of worsening, expected postoperative changes.  EEG 1/18 without evidence of non-convulsive status epilepticus. Seizures - face, mouth, left foot.  Per family, getting more frequent as of 1/21 AM. Plan Sedation: precedex, fentanyl (currently not needing either) RASS goal 0 Serial  neuro checks JP drain and post op management per neurosurgery Ativan PRN added 1/21 Continue empiric keppra, dose increased 1/21 per neuro recs Loaded with 20mg /kg fosphenytoin per neuro recs Neuro consulted, will see today.  Likely to need cEEG Will consult palliative care to assist family with goals of care discussions (per their request)  Acute respiratory failure- remains on vent post neurosurgery for evacuation of subdural hematoma Hx OSA - of note, has hx noncompliance over past 3 years with CPAP Plan Continue full ventilator support (was weaning at 5/5 AM 1/21, but flipped back to [facility] after recurrent seizure) Daily chest x-ray while intubated Daily assessment for readiness for weaning Will likely ultimately need tracheostomy to help with prolonged weaning and eventual successful liberation from mechanical ventilatory support - family currently undecided on whether they would like to pursue this.  Awaiting update from neurosurgery and neurology regarding prognosis.  Hypertension Plan: Continue labetalol, cozaar, lopressor  Chronic atrial fibrillation (  on warfarin) Plan Continue Lopressor (added 1/20) Continue telemetry monitoring Hold anticoagulation until cleared by surgery to resume Due to frequent falls he may not be a candidate for further anticoagulation  Hypernatremia Volume overload - +7.3L positive since admit Plan: D/c saline 40mg  lasix x 2 doses.  Fever and leukocytosis - resolved. Plan: Continue to monitor off abx  Anemia of chronic disease Plan Trend CBC Transfuse per protocol  DM with hyperglycemia Plan Sliding scale insulin increased 1/20   FAMILY  - Updates: Wife and family updated extensively at bedside 1/21.  They have asked for palliative care to meet with them.  Awaiting updates from neurosurgery and neurology regarding prognosis - unsure if they would want to pursue tracheostomy at this time.  - Inter-disciplinary family meet or Palliative  Care meeting due by:  1/24  DVT prophylaxis: scd SUP: ppi   Diet: tube feeds Activity: BR Disposition : Neuro-icu   CC time: 30 min.   Montey Hora, [facility] Pager: [phone]  or [phone] 10/31/2017, 9:39 AM  Mr. Dolley has been having focal seizures this morning.  He was given 2 mg of Ativan we have not seen her so focal seizures.  His dose of Keppra was increased and he has been loaded with fosphenytoin.  An EEG is pending. His examination at present is extremely poor.  He is not showing any response to voice or pain for me.  His pupils are equal and reactive, his EOMs are absent by doll's eyes.  An MRI to evaluate for structural injury is pending.  Lars Masson, MD

## 2017-10-31 NOTE — Progress Notes (Signed)
Continues to have intermittent seizures on EEG. Will add lacosamide 200mg  Q12H.  Roland Rack, MD [facility] [phone]  If 7pm- 7am, please page neurology on call as listed in [place].

## 2017-10-31 NOTE — Progress Notes (Signed)
Pt placed back on full vent support per MD due to seizures.

## 2017-10-31 NOTE — Progress Notes (Signed)
Pt seen and examined. Right sided partial seizures observed with increasing frequency.  EXAM: Temp:  [98.5 F (36.9 C)-100.2 F (37.9 C)] 98.5 F (36.9 C) (01/21 0400) Pulse Rate:  [56-135] 113 (01/21 0900) Resp:  [12-33] 14 (01/21 0900) BP: (113-185)/(62-126) 157/86 (01/21 0839) SpO2:  [96 %-100 %] 98 % (01/21 0900) FiO2 (%):  [30 %-35 %] 30 % (01/21 0900) Weight:  [113.9 kg (251 lb 1.7 oz)] 113.9 kg (251 lb 1.7 oz) (01/21 0500) Intake/Output      01/20 0701 - 01/21 0700 01/21 0701 - 01/22 0700   I.V. (mL/kg) 2400 (21.1) 100 (0.9)   NG/GT 960 40   IV Piggyback 410    Total Intake(mL/kg) 3770 (33.1) 140 (1.2)   Urine (mL/kg/hr) 1575 (0.6) 275 (0.7)   Emesis/NG output  0   Drains 25 0   Stool 0    Total Output 1600 275   Net +2170 -135        Urine Occurrence 1 x    Stool Occurrence 1 x     PERRL, corneals intact Reflexive movement only in response to painful stimulus  LABS: Lab Results  Component Value Date   CREATININE 0.93 10/31/2017   BUN 27 (H) 10/31/2017   NA 149 (H) 10/31/2017   K 3.6 10/31/2017   CL 112 (H) 10/31/2017   CO2 25 10/31/2017   Lab Results  Component Value Date   WBC 11.2 (H) 10/31/2017   HGB 10.1 (L) 10/31/2017   HCT 31.5 (L) 10/31/2017   MCV 104.7 (H) 10/31/2017   PLT 167 10/31/2017    IMAGING: No new imaging  IMPRESSION: - 77 y.o. male with large SDH.  Comatose.  Poor prognosis.  PLAN: - Check EEG, possibly LTEEG, to r/o ongoing epileptic activity responsible for depressed mental status - MRI if EEG unrevealing - Family discussion after all of this has been performed.

## 2017-10-31 NOTE — Progress Notes (Signed)
[facility] Progress Note Patient Name: [name] DOB: [birth year] MRN: [id number]   Date of Service  10/31/2017  HPI/Events of Note  Hypertension - BP = 173/100. Labetalol order has expired. Patient is on Cozaar 100 mg PO Q day.   eICU Interventions  Will order: 1. Will reorder Labetalol 20 mg IV PRN. 2. Restart home Cozaar 100 mg PO now and Q day.      Intervention Category Major Interventions: Hypertension - evaluation and management  Jettie Mannor [place] 10/31/2017, 4:24 AM

## 2017-10-31 NOTE — Progress Notes (Signed)
PT Cancellation Note  Patient Details Name: [name] MRN: [id number] DOB: 1941/06/30   Cancelled Treatment:    Reason Eval/Treat Not Completed: Patient not medically ready. Per RN pt has been seizing all morning and she just gave him ativan. Pt doesn't follow commands and is on full vent support. PT to await for pt to be more medically stable and able to participate in mobility before re-evaluation.   Kittie Plater, PT, DPT Pager #: [phone] Office #: [phone]    [place] 10/31/2017, 10:15 AM

## 2017-10-31 NOTE — Progress Notes (Signed)
EEG complete - results pending 

## 2017-10-31 NOTE — Progress Notes (Signed)
Patient had been loaded with Phenytoin, see MAR, and became hypotensive SBP 60's. Pt position changed with no improvement, CCM to give 1L NS bolus and start Neo gtt if no improvement. Pt improved with bolus, will continue to monitor.   [place] Sours, Therapist, sports

## 2017-10-31 NOTE — Progress Notes (Signed)
Pt staying on for LTM

## 2017-11-01 ENCOUNTER — Inpatient Hospital Stay (HOSPITAL_COMMUNITY): Payer: Medicare HMO

## 2017-11-01 ENCOUNTER — Encounter (HOSPITAL_COMMUNITY): Payer: Self-pay

## 2017-11-01 ENCOUNTER — Other Ambulatory Visit: Payer: Self-pay

## 2017-11-01 DIAGNOSIS — Z7189 Other specified counseling: Secondary | ICD-10-CM

## 2017-11-01 DIAGNOSIS — Z515 Encounter for palliative care: Secondary | ICD-10-CM

## 2017-11-01 LAB — GLUCOSE, CAPILLARY
GLUCOSE-CAPILLARY: 217 mg/dL — AB (ref 65–99)
GLUCOSE-CAPILLARY: 252 mg/dL — AB (ref 65–99)
Glucose-Capillary: 184 mg/dL — ABNORMAL HIGH (ref 65–99)
Glucose-Capillary: 238 mg/dL — ABNORMAL HIGH (ref 65–99)
Glucose-Capillary: 163 mg/dL — ABNORMAL HIGH (ref 65–99)
Glucose-Capillary: 151 mg/dL — ABNORMAL HIGH (ref 65–99)

## 2017-11-01 MED ORDER — LORAZEPAM 2 MG/ML IJ SOLN
2.0000 mg | Freq: Once | INTRAMUSCULAR | Status: AC
  Administered 2017-11-01: 2 mg via INTRAVENOUS
  Filled 2017-11-01: qty 1

## 2017-11-01 MED ORDER — POTASSIUM CHLORIDE 20 MEQ/15ML (10%) PO SOLN
40.0000 meq | Freq: Two times a day (BID) | ORAL | Status: DC
  Administered 2017-11-01 – 2017-11-02 (×4): 40 meq
  Filled 2017-11-01 (×5): qty 30

## 2017-11-01 MED ORDER — FUROSEMIDE 10 MG/ML IJ SOLN
40.0000 mg | Freq: Two times a day (BID) | INTRAMUSCULAR | Status: DC
  Administered 2017-11-01 – 2017-11-03 (×5): 40 mg via INTRAVENOUS
  Filled 2017-11-01 (×5): qty 4

## 2017-11-01 NOTE — Progress Notes (Signed)
Inpatient Diabetes Program Recommendations  AACE/ADA: New Consensus Statement on Inpatient Glycemic Control (2015)  Target Ranges:  Prepandial:   less than 140 mg/dL      Peak postprandial:   less than 180 mg/dL (1-2 hours)      Critically ill patients:  140 - 180 mg/dL  Results for DORANCE, SPINK (MRN [id number]) as of 11/01/2017 11:01  Ref. Range 10/31/2017 08:26 10/31/2017 11:46 10/31/2017 15:53 10/31/2017 20:20 10/31/2017 23:35 11/01/2017 03:42 11/01/2017 08:21  Glucose-Capillary Latest Ref Range: 65 - 99 mg/dL 228 (H) 306 (H) 215 (H) 159 (H) 260 (H) 217 (H) 184 (H)    Review of Glycemic Control  Current orders for Inpatient glycemic control: Lantus 10 units QHS, Novolog 8 units Q4H, Novolog 0-20 units Q4H  Inpatient Diabetes Program Recommendations:  Insulin - Basal: Please consider increasing Lantus to 15 units QHS.  Insulin - Tube Feeding Coverage: Please consider increasing tube feeding coverage to Novolog 10 units Q4H.  Thanks, Barnie Alderman, RN, MSN, CDE Diabetes Coordinator Inpatient Diabetes Program [phone] (Team Pager from 8am to 5pm)

## 2017-11-01 NOTE — Plan of Care (Signed)
Pt currently receiving tube feeds.  If patient is extubated will need a consult from speech therapy for swallowing abilities.  Katherine Mantle RN

## 2017-11-01 NOTE — Progress Notes (Signed)
PULMONARY / CRITICAL CARE MEDICINE   Name: [name] MRN: [id number] DOB: Sep 20, 1941    ADMISSION DATE:  11/06/2017 CONSULTATION DATE:  1/17  REFERRING MD:  Ditty   CHIEF COMPLAINT:  Vent management   HISTORY OF PRESENT ILLNESS:   77 year old male with history of DM, hypertension, A. fib on Coumadin, hyperlipidemia presented 1/17 to the ER as code stroke after he was found unresponsive.  Of note he had a fall 1/16 at which time he came to the ER and had negative head CT.  He was discharged home.  Woke up on the morning of admission with a headache and went to bed, was then found unresponsive.  Was found to have large left subdural hematoma with midline shift. KCentra given.  Seen in consultation by neurosurgery and taken urgently to the OR for evacuation of SDH.  He remained intubated postop and critical care consulted for vent and medical management.    SUBJECTIVE:  EEG showed seizures (see studies section below).  Neuro added lacosamide overnight. cEEG ongoing now.  MRI planned for later today.   VITAL SIGNS: BP (!) 156/98   Pulse (!) 115   Temp 99.5 F (37.5 C) (Oral)   Resp 17   Ht 5\' 10"  (1.778 m)   Wt 113.1 kg (249 lb 5.4 oz)   SpO2 94%   BMI 35.78 kg/m   HEMODYNAMICS:    VENTILATOR SETTINGS: Vent Mode: PRVC FiO2 (%):  [30 %] 30 % Set Rate:  [15 bmp] 15 bmp Vt Set:  [580 mL] 580 mL PEEP:  [5 cmH20] 5 cmH20 Pressure Support:  [5 cmH20] 5 cmH20 Plateau Pressure:  [13 cmH20-16 cmH20] 15 cmH20  INTAKE / OUTPUT:  Intake/Output Summary (Last 24 hours) at 11/01/2017 3818 Last data filed at 11/01/2017 0800 Gross per 24 hour  Intake 2710 ml  Output 3950 ml  Net -1240 ml     PHYSICAL EXAMINATION:  General: Morbidly obese male, in NAD HEENT: Scalp staples in place.  PERRL.  MMM. No facial twitching noted this AM Neuro: Unresponsive, does not follow any commands CV: IRIR, no M/R/G PULM: even/non-labored, lungs bilaterally coarse rhonchi EX:HBZJ,  non-tender, bsx4 active obese Extremities: warm/dry, 2+ edema areas of ecchymosis Skin: no rashes or lesions   LABS:  BMET Recent Labs  Lab 10/29/17 0520 10/30/17 0851 10/31/17 0428  NA 142 145 149*  K 3.5 3.6 3.6  CL 109 111 112*  CO2 23 25 25   BUN 26* 26* 27*  CREATININE 0.86 0.87 0.93  GLUCOSE 257* 216* 246*    Electrolytes Recent Labs  Lab 10/29/17 0520 10/30/17 0851 10/31/17 0428  CALCIUM 8.0* 8.1* 8.5*  MG 1.6* 1.6* 1.8  PHOS 2.1* 1.9* 2.9    CBC Recent Labs  Lab 10/29/17 0520 10/30/17 0851 10/31/17 0428  WBC 14.3* 10.6* 11.2*  HGB 10.1* 9.7* 10.1*  HCT 30.1* 29.9* 31.5*  PLT 120* 143* 167    Coag's Recent Labs  Lab 10/16/2017 1300  10/28/17 0406 10/29/17 0520 10/31/17 1123  APTT 39*  --   --   --   --   INR 2.68   < > 1.19 1.25 1.17   < > = values in this interval not displayed.    Sepsis Markers Recent Labs  Lab 11/06/2017 1255 10/28/17 1027  LATICACIDVEN 2.54*  --   PROCALCITON  --  0.22    ABG Recent Labs  Lab 11/05/2017 1626 10/29/17 0425 10/31/17 1328  PHART 7.355 7.382 7.402  PCO2ART 46.0 40.3  39.9  PO2ART 387.0* 213* 94.0    Liver Enzymes Recent Labs  Lab 10/11/2017 1228 10/29/17 0520  AST 32 19  ALT 27 14*  ALKPHOS 68 44  BILITOT 2.2* 2.0*  ALBUMIN 3.9 2.9*    Cardiac Enzymes No results for input(s): TROPONINI, PROBNP in the last 168 hours.  Glucose Recent Labs  Lab 10/31/17 0826 10/31/17 1146 10/31/17 1553 10/31/17 2020 10/31/17 2335 11/01/17 0342  GLUCAP 228* 306* 215* 159* 260* 217*    Imaging Dg Chest Port 1 View  Result Date: 11/01/2017 CLINICAL DATA:  Respiratory failure, intubated patient EXAM: PORTABLE CHEST 1 VIEW COMPARISON:  Portable chest x-ray of October 31, 2017 FINDINGS: The lungs are well-expanded. There is subtle increased density in the infrahilar regions bilaterally. There is no pleural effusion on the right. There is a small left pleural effusion. There is no pneumothorax. The heart  and pulmonary vascularity are normal. There calcification in the wall of the aortic arch. The endotracheal tube tip projects 4.1 cm above the carina. The esophagogastric tube tip and proximal port project below the GE junction. IMPRESSION: Improved aeration of both lungs. Persistent mild subsegmental atelectasis in the infrahilar regions bilaterally. Stable small left pleural effusion. Thoracic aortic atherosclerosis. The support tubes are in reasonable position. Electronically Signed   By: David  Martinique M.D.   On: 11/01/2017 07:46    STUDIES:  CT head 1/17 > left SDH, mass effect with uncal herniation, 2.6cm rightward MLS, 3cm hemorrhagic contusion in left temporal lobe. EEG 1/18 > mod generalized low voltage slowing of brain activity.  No non-convulsive status. EEG 1/21 > diffuse cerebral dysfunction.  Slowing over left hemisphere, lateralized periodic discharges over left mid temporal region, seizure arising from left temporal region lasting 60 seconds. Brain MRI 1/22 >   CULTURES: UC 1/18>>> negative  ANTIBIOTICS: Ancef 1/17 > peri-op   SIGNIFICANT EVENTS: 1/17 OR>>> crani, evacuation of L SDH  1/21 > seizing more frequently to face, mouth, left foot.  Loaded with fosphenytoin, keppra increased, neuro consulted  LINES/TUBES: ETT 1/17 >> Orogastric tube 10/14/2017>>  DISCUSSION: 77 year old male with large SDH in setting fall on Coumadin.  s/p OR for craniotomy and evacuation of subdural hematoma by neurosurgery.  Remains vented postop. 1/21 having more frequent seizure activity to face, mouth, left foot - consulted neuro, loaded with fosphyenytoin, keppra increased, ativan added PRN.  Family also requesting palliative care to meet with them to assist with goals of care planning.  ASSESSMENT / PLAN:   Large left subdural hematoma with midline shift in setting fall on Coumadin - status post OR for evacuation.  Follow-up CT head 1/18 without evidence of worsening, expected  postoperative changes.  EEG 1/18 without evidence of non-convulsive status epilepticus. Seizures - face, mouth, left foot.  Per family, getting more frequent as of 1/21 AM. Plan Sedation: precedex, fentanyl RASS goal 0 Serial neuro checks Post op management per neurosurgery Ativan PRN added 1/21 Neuro following AED's per neuro cEEG ongoing now and MRI to follow Palliative care consulted to assist family with goals of care discussions (per their request)  Acute respiratory failure- remains on vent post neurosurgery for evacuation of subdural hematoma Hx OSA - of note, has hx noncompliance over past 3 years with CPAP Plan Continue full ventilator support (no weaning trials given seizures) Follow CXR Daily assessment for readiness for weaning Will likely ultimately need tracheostomy to help with prolonged weaning and eventual successful liberation from mechanical ventilatory support - family currently undecided on whether they  would like to pursue this.  Awaiting update from neurosurgery and neurology regarding prognosis.  Hypertension Plan: Continue labetalol, cozaar, lopressor  Chronic atrial fibrillation (on warfarin) Plan Continue Lopressor (added 1/20) Continue telemetry monitoring Hold anticoagulation until cleared by surgery to resume Due to frequent falls he may not be a candidate for further anticoagulation  Hypernatremia Volume overload - +6L positive since admit, responded well to 40mg  lasix BID on 1/21 Plan: Continue diuresis  Fever and leukocytosis - resolved. Plan: Continue to monitor off abx  Anemia of chronic disease Plan Trend CBC Transfuse per protocol  DM with hyperglycemia Plan Sliding scale insulin increased 1/20   FAMILY  - Updates: Wife and family updated extensively at bedside 1/21, 1/22.  They have asked for palliative care to meet with them.  Awaiting updates from neurosurgery and neurology regarding prognosis - unsure if they would want to  pursue tracheostomy at this time.  - Inter-disciplinary family meet or Palliative Care meeting due by:  1/24  DVT prophylaxis: scd SUP: ppi   Diet: tube feeds Activity: BR Disposition : Neuro-icu   CC time: 30 min.   Montey Hora, [facility] Pager: [phone]  or [phone] 11/01/2017, 8:26 AM

## 2017-11-01 NOTE — Progress Notes (Signed)
No charge note:   Palliative consult received.  Meeting with family today at 3pm.   Thank you for allowing Palliative Medicine to assist in the care of this patient.  Mariana Kaufman, AGNP-C Palliative Medicine  Please call Palliative Medicine team phone with any questions [phone]. For individual providers please see AMION.

## 2017-11-01 NOTE — Progress Notes (Signed)
Could not take patient to MRI d/t continuous EEG in place.

## 2017-11-01 NOTE — Progress Notes (Signed)
PT Cancellation Note  Patient Details Name: [name] MRN: [id number] DOB: [birth year]   Cancelled Treatment:    Reason Eval/Treat Not Completed: Patient not medically ready. Pt now on continuous EEG. PT to cancel orders. Please re-consult when pt medically appropriate. Thank you.   Rondell Pardon M Brandee Markin 11/01/2017, 7:39 AM   Kittie Plater, PT, DPT Pager #: [phone] Office #: [phone]

## 2017-11-01 NOTE — Progress Notes (Signed)
Continues to have seizures On LTEEG PERRL No response to painful stimulus Poor prognosis Continue LTEEG Seizure management at direction of Neurology

## 2017-11-01 NOTE — Progress Notes (Signed)
OT Cancellation   11/01/17 0700  OT Visit Information  Last OT Received On 11/01/17  Reason Eval/Treat Not Completed Patient not medically ready. Pt on continuous EEG. Will return for OT evaluation as pt medically ready and as schedule allows. Thank you.   [place], OTR/L Acute Rehab Pager: [phone] Office: [phone]

## 2017-11-01 NOTE — Procedures (Signed)
  Electroencephalogram report- LTM    Data acquisition: 10-20 electrode placement.  Additional T1, T2, and EKG electrodes; 26 channel digital referential acquisition reformatted to 18 channel/7 channel coronal bipolar     Spike detection: ON     Seizure detection: ON   Beginning time: 10/31/2017 at 1323 Ending time: 11/01/2017 at 8:25 AM  CPT: 95951 Day of study: Day 1   This 19 hours of intensive EEG monitoring with simultaneous video monitoring was performed for this patient with spells company by head shaking and mild restriction to rule out clinical subclinical seizures. Medications as per EMR  Several pushbutton activation events were noted.  These were activated by family and staff in response to rhythmic head jerking and mouth twitching.  Electrographically there is a ictal pattern noted with buildup of polyspike and wave discharges across left mid temporal and central parietal region involving in morphology frequency and amplitude however continue to be confined to the left hemisphere.  Movement artifact was present across right hemisphere based on head position.    In addition there is also multiple electrographic seizures were present with very subtle on no clinical manifestations with the same EEG pattern lateralizing to the left hemisphere localizing to the left temporal parietal region.  Seizures occur on average 2-3 seizures an hour during first half of the recording.  Seizures become less frequent during the second half of the recording and occur every 1-2 hours.  Last seizure was around 7 AM.  Seizure prior to that was around 3 AM.  Clinical interpretation: This 19 hours of intensive EEG monitoring was simultaneous with video monitoring recorded multiple clinical and electrographic seizures marked by rhythmic head twitching mouth twitching.  Electrographically seizures lateralized to the left hemisphere localizing to the left anterior temporal/parietal/ central parietal region.   Seizures remain confined to the left hemisphere.  Clinical correlation is advised

## 2017-11-01 NOTE — Progress Notes (Signed)
Neurology Progress Note   S:// Patient seen and examined.  Multiple pushbutton events concerning for seizures overnight. Long-term EEG report below.  Continues to have a poor exam Vimpat added overnight  O:// Current vital signs: BP (!) 167/94 Comment: retook 121/68  Pulse (!) 108   Temp 100.1 F (37.8 C) (Axillary)   Resp (!) 22   Ht 5' 10"  (1.778 m)   Wt 113.1 kg (249 lb 5.4 oz)   SpO2 96%   BMI 35.78 kg/m  Vital signs in last 24 hours: Temp:  [99.5 F (37.5 C)-100.9 F (38.3 C)] 100.1 F (37.8 C) (01/22 0800) Pulse Rate:  [79-134] 108 (01/22 1100) Resp:  [9-24] 22 (01/22 1100) BP: (67-174)/(45-110) 167/94 (01/22 1100) SpO2:  [92 %-100 %] 96 % (01/22 1127) FiO2 (%):  [30 %] 30 % (01/22 1127) Weight:  [113.1 kg (249 lb 5.4 oz)] 113.1 kg (249 lb 5.4 oz) (01/22 0500) Neurological exam remains poor and unchanged. Mental Status: Patient does not respond to verbal stimuli.  Does not respond to deep sternal rub.  Does not follow commands.  No verbalizations noted.  Cranial Nerves: II: patient does not respond confrontation bilaterally, pupils right 2 mm, left 2 mm,and sluggishly reactive bilaterally III,IV,VI: doll's response absent bilaterally.  V,VII: corneal reflex present bilaterally  VIII: patient does not respond to verbal stimuli IX,X: gag reflex present, XI: trapezius strength unable to test bilaterally XII: tongue strength unable to test Motor: Extremities flaccid throughout.  As noted bilateral triple reflex when stimulated  sensory: Does not respond to noxious stimuli in any extremity. Deep Tendon Reflexes:  Absent throughout. Plantars: upgoing bilaterally Cerebellar: Unable to perform  Medications  Current Facility-Administered Medications:  .  acetaminophen (TYLENOL) solution 650 mg, 650 mg, Per Tube, Q4H PRN, Renee Pain, MD, 650 mg at 11/01/17 0923 .  bisacodyl (DULCOLAX) suppository 10 mg, 10 mg, Rectal, Daily PRN, Newman Pies, MD, 10 mg at  10/30/17 0953 .  ceFAZolin (ANCEF) IVPB 2g/100 mL premix, 2 g, Intravenous, Q8H, Ditty, Kevan Ny, MD, Stopped at 11/01/17 1009 .  chlorhexidine gluconate (MEDLINE KIT) (PERIDEX) 0.12 % solution 15 mL, 15 mL, Mouth Rinse, BID, Corey Harold, NP, 15 mL at 11/01/17 0800 .  dexmedetomidine (PRECEDEX) 200 MCG/50ML (4 mcg/mL) infusion, 0.2-0.7 mcg/kg/hr, Intravenous, Continuous, Newman Pies, MD .  docusate (COLACE) 50 MG/5ML liquid 100 mg, 100 mg, Oral, BID, Ditty, Kevan Ny, MD, 100 mg at 11/01/17 0923 .  feeding supplement (PRO-STAT SUGAR FREE 64) liquid 30 mL, 30 mL, Per Tube, 5 X Daily, Renee Pain, MD, 30 mL at 11/01/17 0921 .  feeding supplement (VITAL HIGH PROTEIN) liquid 1,000 mL, 1,000 mL, Per Tube, Q24H, Salvadore Dom E, NP, 1,000 mL at 11/01/17 1026 .  fentaNYL (SUBLIMAZE) injection 50 mcg, 50 mcg, Intravenous, Q2H PRN, Corey Harold, NP, 50 mcg at 10/30/17 0303 .  finasteride (PROSCAR) tablet 5 mg, 5 mg, Oral, Daily, Ditty, Kevan Ny, MD, 5 mg at 11/01/17 0926 .  folic acid (FOLVITE) tablet 1 mg, 1 mg, Oral, Daily, Newman Pies, MD, 1 mg at 11/01/17 0925 .  furosemide (LASIX) injection 40 mg, 40 mg, Intravenous, Q12H, Desai, Rahul P, PA-C, 40 mg at 11/01/17 2409 .  hydrALAZINE (APRESOLINE) injection 10-40 mg, 10-40 mg, Intravenous, Q4H PRN, Desai, Rahul P, PA-C .  insulin aspart (novoLOG) injection 0-20 Units, 0-20 Units, Subcutaneous, Q4H, Rigoberto Noel, MD, 4 Units at 11/01/17 0854 .  insulin aspart (novoLOG) injection 8 Units, 8 Units, Subcutaneous, Q4H, Minor, Grace Bushy,  NP, 8 Units at 11/01/17 0854 .  insulin glargine (LANTUS) injection 10 Units, 10 Units, Subcutaneous, QHS, Desai, Rahul P, PA-C, 10 Units at 10/31/17 2111 .  labetalol (NORMODYNE,TRANDATE) injection 20 mg, 20 mg, Intravenous, Q10 min PRN, Anders Simmonds, MD, 20 mg at 10/31/17 0817 .  lacosamide (VIMPAT) 200 mg in sodium chloride 0.9 % 25 mL IVPB, 200 mg, Intravenous, Q12H, Greta Doom, MD, Stopped at 11/01/17 1120 .  levETIRAcetam (KEPPRA) 1,500 mg in sodium chloride 0.9 % 100 mL IVPB, 1,500 mg, Intravenous, Q12H, Greta Doom, MD, Stopped at 11/01/17 [phone] .  LORazepam (ATIVAN) injection 0-2 mg, 0-2 mg, Intravenous, Q1H PRN, Desai, Rahul P, PA-C .  losartan (COZAAR) tablet 100 mg, 100 mg, Oral, Daily, Anders Simmonds, MD, 100 mg at 11/01/17 0924 .  magnesium oxide (MAG-OX) tablet 400 mg, 400 mg, Per Tube, Daily, Erick Colace, NP, 400 mg at 11/01/17 0925 .  MEDLINE mouth rinse, 15 mL, Mouth Rinse, Q2H, Newman Pies, MD, 15 mL at 11/01/17 0942 .  metoprolol tartrate (LOPRESSOR) 25 mg/10 mL oral suspension 100 mg, 100 mg, Per Tube, BID, Erick Colace, NP, 100 mg at 11/01/17 0921 .  naloxone [facility]) injection 0.08 mg, 0.08 mg, Intravenous, PRN, Ditty, Kevan Ny, MD .  ondansetron (ZOFRAN) tablet 4 mg, 4 mg, Oral, Q4H PRN **OR** ondansetron (ZOFRAN) injection 4 mg, 4 mg, Intravenous, Q4H PRN, Ditty, Kevan Ny, MD .  pantoprazole (PROTONIX) injection 40 mg, 40 mg, Intravenous, QHS, Ditty, Kevan Ny, MD, 40 mg at 10/31/17 2113 .  phenylephrine (NEO-SYNEPHRINE) 10 mg in sodium chloride 0.9 % 250 mL (0.04 mg/mL) infusion, 0-400 mcg/min, Intravenous, Titrated, Sampson Goon, MD, Stopped at 10/31/17 1507 .  phenytoin (DILANTIN) injection 100 mg, 100 mg, Intravenous, Q8H, Marliss Coots, PA-C, 100 mg at 11/01/17 5993 .  potassium chloride 20 MEQ/15ML (10%) solution 40 mEq, 40 mEq, Per Tube, BID, Desai, Rahul P, PA-C, 40 mEq at 11/01/17 5701 .  promethazine (PHENERGAN) tablet 12.5-25 mg, 12.5-25 mg, Oral, Q4H PRN, Ditty, Kevan Ny, MD .  senna [facility]) tablet 8.6 mg, 1 tablet, Oral, BID, Ditty, Kevan Ny, MD, 8.6 mg at 11/01/17 0923 .  sodium phosphate (FLEET) 7-19 GM/118ML enema 1 enema, 1 enema, Rectal, Once PRN, Ditty, Kevan Ny, MD .  thiamine (VITAMIN B-1) tablet 100 mg, 100 mg, Oral, Daily, Newman Pies, MD, 100 mg at  11/01/17 0924 .  vitamin C (ASCORBIC ACID) tablet 500 mg, 500 mg, Oral, QODAY, Ditty, Kevan Ny, MD, 500 mg at 11/01/17 0923 Labs CBC    Component Value Date/Time   WBC 11.2 (H) 10/31/2017 0428   RBC 3.01 (L) 10/31/2017 0428   HGB 10.1 (L) 10/31/2017 0428   HCT 31.5 (L) 10/31/2017 0428   PLT 167 10/31/2017 0428   MCV 104.7 (H) 10/31/2017 0428   MCH 33.6 10/31/2017 0428   MCHC 32.1 10/31/2017 0428   RDW 14.7 10/31/2017 0428   LYMPHSABS 1.1 10/31/2017 0428   MONOABS 1.2 (H) 10/31/2017 0428   EOSABS 0.1 10/31/2017 0428   BASOSABS 0.0 10/31/2017 0428    CMP     Component Value Date/Time   NA 149 (H) 10/31/2017 0428   K 3.6 10/31/2017 0428   CL 112 (H) 10/31/2017 0428   CO2 25 10/31/2017 0428   GLUCOSE 246 (H) 10/31/2017 0428   GLUCOSE 182 (H) 10/18/2006 1016   BUN 27 (H) 10/31/2017 0428   CREATININE 0.93 10/31/2017 0428   CALCIUM 8.5 (L) 10/31/2017 7793  PROT 5.4 (L) 10/29/2017 0520   ALBUMIN 2.9 (L) 10/29/2017 0520   AST 19 10/29/2017 0520   ALT 14 (L) 10/29/2017 0520   ALKPHOS 44 10/29/2017 0520   BILITOT 2.0 (H) 10/29/2017 0520   GFRNONAA >60 10/31/2017 0428   GFRAA >60 10/31/2017 0428    LTM EEG report Clinical interpretation: This 19 hours of intensive EEG monitoring was simultaneous with video monitoring recorded multiple clinical and electrographic seizures marked by rhythmic head twitching mouth twitching.  Electrographically seizures lateralized to the left hemisphere localizing to the left anterior temporal/parietal/ central parietal region.  Seizures remain confined to the left hemisphere.  Clinical correlation is advised  Imaging I have reviewed images in epic and the results pertinent to this consultation are:  CT-scan of the brain -CT had shows subdural 7.8 mm shift.  Assessment:  77 year old man with left subdural hematoma status post craniotomy day 6, showing signs of seizure activity clinically and multiple seizures electrographically on the EEG  overnight. His seizures originating from the left hemisphere, possibly from the underlying subdural. He is currently intubated and on 3 antiepileptics-Keppra, Dilantin and Vimpat. Neurosurgically, he does not have a very good prognosis given the size of his bleed and poor exam this far out from surgery. The presence of seizures on top of the poor neurological exam also makes his prognosis going forward poor. I discussed with the family-wife-in detail that the next step to control the seizures would be to start him on heavy sedation with propofol or Versed to achieve burst suppression, which we might be able to achieve but when we go down on the sedation we might run back into the problem that we have today of seizures emanating from the left hemisphere. The wife remains understanding of the current situation and the possibility that this situation might not lead to a good prognosis at all.  She will discussed this with the other family members. I also discussed my findings with Dr. Cyndy Freeze over the phone, who is also in agreement that in general, he is a very sick patient with a poor prognosis and pursuing more aggressive antiepileptic treatment will not alter the poor outcome.  Impression: Evaluate for status epilepticus Subdural hematoma Respiratory failure  Recommendations: Family is meeting with palliative care team this afternoon.  I would recommend having a serious goals of care conversation with the family with the poor prognosis and mine. Until a final decision is made, continue with the current doses of antiepileptic. I would also leave him on the LTM EEG telemetry a further decision on goals of care is made. If a decision is made to withdraw care to limit care, EEG can then be discontinued. If a decision is made to pursue aggressive care, we would then go on to attempt to achieve burst suppression with incremental doses of propofol and Versed.  -- Amie Portland, MD [facility]uro[facility] Pager: [phone] If 7pm to 7am, please call on call as listed on AMION.  CRITICAL CARE ATTESTATION This patient is critically ill and at significant risk of neurological worsening, death and care requires constant monitoring of vital signs, hemodynamics,respiratory and cardiac monitoring. I spent 40  minutes of neurocritical care time performing neurological assessment, discussion with family, other specialists and medical decision making of high complexityin the care of  this patient.

## 2017-11-01 NOTE — Consult Note (Signed)
Consultation Note Date: 11/01/2017   Patient Name: [name]  DOB: 07/16/1941  MRN: [id number]  Age / Sex: 77 y.o., male  PCP: Loraine Leriche., MD Referring Physician: Ditty, Kevan Ny, MD  Reason for Consultation: Establishing goals of care  HPI/Patient Profile: 77 y.o. male  with past medical history of DM, HTN, dislipidemia. A. fib  admitted on 10/21/2017 after being found unresponsive at home. Workup revealed large left subdural hematoma. Craniotomy performed. Has remained vent dependent and non-responsive with little sedation. Having seizures originating from area of bleed. Neurology discussed possible deep sedation and burst suppression. Palliative consulted per family request for [place].     Clinical Assessment and Goals of Care:  I have reviewed medical records including EPIC notes, labs and imaging, received report from Dr. Rory Percy, assessed the patient and then met at the bedside along with patient's wife- Jeannene Patella, daughter- Abigail Butts, and son - Nicki Reaper (via speakerphone) to discuss diagnosis prognosis, GOC, EOL wishes, disposition and options.  I introduced Palliative Medicine as specialized medical care for people living with serious illness. It focuses on providing relief from the symptoms and stress of a serious illness. The goal is to improve quality of life for both the patient and the family.  We discussed a brief life review of the patient. He is retired from working in the prison system helping to rehab inmates. He enjoyed volunteering for [employer] after his retirement.   As far as functional and nutritional status- prior to admission Pam notes he had declined significantly over the last year. He had lower extremity pain that impaired his ability to walk. He was leaving the house less and less. He mostly stayed home and watched television. He did enjoy [place].    We discussed  their current illness and what it means in the larger context of their on-going co-morbidities.  Natural disease trajectory and expectations at EOL were discussed.   I attempted to elicit values and goals of care important to the patient. Pam notes that he would want to be able to return to level of function where he could interact with family, speak, eat. His family was always important and he had expressed distress to [facility] that he wasn't able to spend time with his grandchildren as he used to be able to, due to his increasing immobility.   The difference between aggressive medical intervention and comfort care was explained and considered in light of the patient's goals of care.   Advanced directives, concepts specific to code status, artifical feeding and hydration, and rehospitalization were considered and discussed. Patient has living will and notes he would not want long term ventilation, artificial feeding or other extraordinary measures. Pam requests DNR status- understands this to mean that if patient dies despite all current care, then further resuscitative efforts will not be performed. If he is extubated, they would not want him reintubated.   Questions and concerns were addressed.  Hard Choices booklet left for review. The family was encouraged to call with questions or concerns.  Primary Decision Maker NEXT OF KIN patient's spouse- Pam    SUMMARY OF RECOMMENDATIONS -DNR -Discussed patient's poor prognosis with family. Pam indicated that patient would not want to continue aggressive medical therapy and would prefer comfort measures if he knew he would not return to a state of function that he was at prior to admission. Noted information from all care providers indicating patient's poor prognosis. Discussed with Pam that I believe we are in that place. Discussed compassionate extubation and comfort care. -Family requests meeting with CCM and Dr. Rory Percy so that daughter Abigail Butts and son Nicki Reaper  can hear information regarding poor prognosis from them.  -F/U meeting scheduled with family, CCM, Dr. Rory Percy (if able) at 1pm tomorrow -No trach -No PEG    Code Status/Advance Care Planning:  DNR   Additional Recommendations (Limitations, Scope, Preferences):  No Tracheostomy  Psycho-social/Spiritual:   Desire for further Chaplaincy support:no  Prognosis:    Unable to determine  Discharge Planning: To Be Determined  Primary Diagnoses: Present on Admission: . Subdural hematoma ([place]) . Atrial fibrillation ([place]) . Endotracheally intubated   I have reviewed the medical record, interviewed the patient and family, and examined the patient. The following aspects are pertinent.  Past Medical History:  Diagnosis Date  . Diabetes mellitus   . Diverticulosis   . Hyperlipidemia   . Hypertension   . Type 2 diabetes mellitus ([place])    Social History   Socioeconomic History  . Marital status: Married    Spouse name: None  . Number of children: None  . Years of education: None  . Highest education level: None  Social Needs  . Financial resource strain: None  . Food insecurity - worry: None  . Food insecurity - inability: None  . Transportation needs - medical: None  . Transportation needs - non-medical: None  Occupational History  . Occupation: Agricultural consultant    Comment: retired  Tobacco Use  . Smoking status: Former Smoker    Packs/day: 0.30    Years: 25.00    Pack years: 7.50    Last attempt to quit: 10/11/1984    Years since quitting: 33.0  . Smokeless tobacco: Never Used  Substance and Sexual Activity  . Alcohol use: Yes    Alcohol/week: 7.0 oz    Types: 14 Standard drinks or equivalent per week  . Drug use: No  . Sexual activity: Yes    Partners: Female  Other Topics Concern  . None  Social History Narrative   Exercise-- walk qd for 25-30 min   Family History  Problem Relation Age of Onset  . Cancer Father        pancreatic  . Heart disease  Mother   . Cancer Mother   . Cancer Other        cervical   Scheduled Meds: . chlorhexidine gluconate (MEDLINE KIT)  15 mL Mouth Rinse BID  . docusate  100 mg Oral BID  . feeding supplement (PRO-STAT SUGAR FREE 64)  30 mL Per Tube 5 X Daily  . feeding supplement (VITAL HIGH PROTEIN)  1,000 mL Per Tube Q24H  . finasteride  5 mg Oral Daily  . folic acid  1 mg Oral Daily  . furosemide  40 mg Intravenous Q12H  . insulin aspart  0-20 Units Subcutaneous Q4H  . insulin aspart  8 Units Subcutaneous Q4H  . insulin glargine  10 Units Subcutaneous QHS  . losartan  100 mg Oral Daily  . magnesium oxide  400 mg Per  Tube Daily  . mouth rinse  15 mL Mouth Rinse Q2H  . metoprolol tartrate  100 mg Per Tube BID  . pantoprazole (PROTONIX) IV  40 mg Intravenous QHS  . phenytoin (DILANTIN) IV  100 mg Intravenous Q8H  . potassium chloride  40 mEq Per Tube BID  . senna  1 tablet Oral BID  . thiamine  100 mg Oral Daily  . vitamin C  500 mg Oral QODAY   Continuous Infusions: .  ceFAZolin (ANCEF) IV Stopped (11/01/17 1009)  . dexmedetomidine (PRECEDEX) IV infusion    . lacosamide (VIMPAT) IV Stopped (11/01/17 1120)  . levETIRAcetam Stopped (11/01/17 0939)  . phenylephrine (NEO-SYNEPHRINE) Adult infusion Stopped (10/31/17 1507)   PRN Meds:.acetaminophen (TYLENOL) oral liquid 160 mg/5 mL, bisacodyl, fentaNYL (SUBLIMAZE) injection, hydrALAZINE, labetalol, LORazepam, naLOXone (NARCAN)  injection, ondansetron **OR** ondansetron (ZOFRAN) IV, promethazine, sodium phosphate Medications Prior to Admission:  Prior to Admission medications   Medication Sig Start Date End Date Taking? Authorizing Provider  Ascorbic Acid (VITAMIN C) 500 MG tablet Take 500 mg by mouth every other day.     Yes [provider]  bisoprolol (ZEBETA) 10 MG tablet Take 10 mg by mouth daily.   Yes [provider]  Efinaconazole 10 % SOLN Apply daily to the affected are for 28 days Patient taking differently: Apply 1  application topically daily as needed (toenails). Apply daily to the affected are for 28 days 12/18/13  Yes Roma Schanz R, DO  finasteride (PROSCAR) 5 MG tablet Take 1 tablet (5 mg total) by mouth daily. 09/17/14  Yes Roma Schanz R, DO  glipiZIDE (GLUCOTROL) 5 MG tablet Take 1 tablet (5 mg total) by mouth daily before breakfast. Patient taking differently: Take 10 mg by mouth daily before breakfast.  05/02/14  Yes Carollee Herter, Alferd Apa, DO  losartan (COZAAR) 100 MG tablet Take 1 tablet (100 mg total) by mouth daily. 04/15/15  Yes Roma Schanz R, DO  Magnesium Oxide 500 MG CAPS Take 1 capsule by mouth daily.   Yes [provider]  metFORMIN (GLUCOPHAGE-XR) 500 MG 24 hr tablet TAKE 2 TABLETS TWICE DAILY 12/24/14  Yes Roma Schanz R, DO  metoprolol tartrate (LOPRESSOR) 100 MG tablet Take 100 mg by mouth 2 (two) times daily.   Yes [provider]  naproxen sodium (ALEVE) 220 MG tablet Take 220 mg by mouth 2 (two) times daily.   Yes [provider]  Omega-3 Fatty Acids (FISH OIL) 1200 MG CAPS Take 2 capsules by mouth 2 (two) times daily.     Yes [provider]  sildenafil (VIAGRA) 100 MG tablet Take 1 tablet (100 mg total) by mouth as needed for erectile dysfunction. 08/29/12  Yes Roma Schanz R, DO  sitaGLIPtin (JANUVIA) 100 MG tablet Take 100 mg by mouth every evening.   Yes [provider]  triamterene-hydrochlorothiazide (DYAZIDE) 37.5-25 MG per capsule Take 1 each (1 capsule total) by mouth every morning. 04/15/15  Yes Roma Schanz R, DO  warfarin (COUMADIN) 5 MG tablet Take 5-7.5 mg by mouth daily. Alternating days   Yes [provider]  ACCU-CHEK SMARTVIEW test strip USE TO TEST BLOOD SUGAR EVERY DAY 12/04/14   Roma Schanz R, DO  Alcohol Swabs (B-D SINGLE USE SWABS REGULAR) PADS Use one swab as directed 10/24/13   Carollee Herter, Alferd Apa, DO  Blood Glucose Monitoring Suppl (ACCU-CHEK NANO SMARTVIEW)  W/DEVICE KIT Check blood sugar daily. Dx Code: 250.00 11/16/13  Carollee Herter, Alferd Apa, DO  Lancet Devices (AUTO-LANCET) MISC Test blood sugar daily. Dx Code: 250.00. Accu-Chek nano smart view device 11/16/13   Carollee Herter, Alferd Apa, DO  niacin (NIASPAN) 500 MG CR tablet Take 1 tablet (500 mg total) by mouth at bedtime. 12/30/11 11/15/13  Ann Held, DO   No Known Allergies Review of Systems  Unable to perform ROS: Intubated    Physical Exam  Cardiovascular: Normal rate and regular rhythm.  Pulmonary/Chest:  Intubated   Neurological:  nonresponsive    Vital Signs: BP 120/70   Pulse (!) 108   Temp (!) 100.4 F (38 C) (Axillary)   Resp 19   Ht 5' 10"  (1.778 m)   Wt 113.1 kg (249 lb 5.4 oz)   SpO2 100%   BMI 35.78 kg/m  Pain Assessment: CPOT       SpO2: SpO2: 100 % O2 Device:SpO2: 100 % O2 Flow Rate: .   IO: Intake/output summary:   Intake/Output Summary (Last 24 hours) at 11/01/2017 1611 Last data filed at 11/01/2017 1400 Gross per 24 hour  Intake 1300 ml  Output 2900 ml  Net -1600 ml    LBM: Last BM Date: 10/30/17 Baseline Weight: Weight: 113.2 kg (249 lb 9 oz) Most recent weight: Weight: 113.1 kg (249 lb 5.4 oz)     Palliative Assessment/Data: PPS: 10%     Thank you for this consult. Palliative medicine will continue to follow and assist as needed.   Time In: 1500 Time Out: 1645 Time Total: 105 minutes Prolonged services billed: Yes Greater than 50%  of this time was spent counseling and coordinating care related to the above assessment and plan.  Signed by: Mariana Kaufman, AGNP-C Palliative Medicine    Please contact Palliative Medicine Team phone at [phone] for questions and concerns.  For individual provider: See Shea Evans

## 2017-11-02 ENCOUNTER — Inpatient Hospital Stay (HOSPITAL_COMMUNITY): Payer: Medicare HMO

## 2017-11-02 DIAGNOSIS — Z515 Encounter for palliative care: Secondary | ICD-10-CM

## 2017-11-02 DIAGNOSIS — S065X9A Traumatic subdural hemorrhage with loss of consciousness of unspecified duration, initial encounter: Secondary | ICD-10-CM

## 2017-11-02 LAB — BASIC METABOLIC PANEL
Anion gap: 14 (ref 5–15)
BUN: 37 mg/dL — AB (ref 6–20)
CALCIUM: 8.8 mg/dL — AB (ref 8.9–10.3)
CO2: 27 mmol/L (ref 22–32)
CREATININE: 0.91 mg/dL (ref 0.61–1.24)
Chloride: 112 mmol/L — ABNORMAL HIGH (ref 101–111)
GFR calc Af Amer: >60 mL/min (ref >60)
Glucose, Bld: 243 mg/dL — ABNORMAL HIGH (ref 65–99)
Potassium: 3.6 mmol/L (ref 3.5–5.1)
SODIUM: 153 mmol/L — AB (ref 135–145)

## 2017-11-02 LAB — CBC
HCT: 33.9 % — ABNORMAL LOW (ref 39.0–52.0)
Hemoglobin: 10.3 g/dL — ABNORMAL LOW (ref 13.0–17.0)
MCH: 33 pg (ref 26.0–34.0)
MCHC: 30.4 g/dL (ref 30.0–36.0)
MCV: 108.7 fL — ABNORMAL HIGH (ref 78.0–100.0)
PLATELETS: 200 10*3/uL (ref 150–400)
RBC: 3.12 MIL/uL — ABNORMAL LOW (ref 4.22–5.81)
RDW: 14.6 % (ref 11.5–15.5)
WBC: 12.1 10*3/uL — AB (ref 4.0–10.5)

## 2017-11-02 LAB — BLOOD GAS, ARTERIAL
Acid-Base Excess: 7.2 mmol/L — ABNORMAL HIGH (ref 0.0–2.0)
BICARBONATE: 31 mmol/L — AB (ref 20.0–28.0)
Drawn by: 398981
FIO2: 0.3
LHR: 15 {breaths}/min
O2 Saturation: 96.5 %
PCO2 ART: 42.4 mmHg (ref 32.0–48.0)
PEEP: 5 cmH2O
PO2 ART: 84.6 mmHg (ref 83.0–108.0)
Patient temperature: 98.6
VT: 580 mL
pH, Arterial: 7.477 — ABNORMAL HIGH (ref 7.350–7.450)

## 2017-11-02 LAB — GLUCOSE, CAPILLARY
GLUCOSE-CAPILLARY: 148 mg/dL — AB (ref 65–99)
GLUCOSE-CAPILLARY: 196 mg/dL — AB (ref 65–99)
Glucose-Capillary: 226 mg/dL — ABNORMAL HIGH (ref 65–99)
Glucose-Capillary: 216 mg/dL — ABNORMAL HIGH (ref 65–99)
Glucose-Capillary: 162 mg/dL — ABNORMAL HIGH (ref 65–99)

## 2017-11-02 LAB — PHOSPHORUS: Phosphorus: 2.6 mg/dL (ref 2.5–4.6)

## 2017-11-02 LAB — MAGNESIUM: MAGNESIUM: 1.7 mg/dL (ref 1.7–2.4)

## 2017-11-02 MED ORDER — GLYCOPYRROLATE 1 MG PO TABS: 1.0000 mg | ORAL_TABLET | ORAL | Status: DC

## 2017-11-02 MED ORDER — GLYCOPYRROLATE 1 MG PO TABS
1.0000 mg | ORAL_TABLET | ORAL | Status: DC | PRN
  Administered 2017-11-02: 1 mg via ORAL
  Filled 2017-11-02 (×2): qty 1

## 2017-11-02 MED ORDER — GLYCOPYRROLATE 0.2 MG/ML IJ SOLN
0.2000 mg | INTRAMUSCULAR | Status: DC
  Administered 2017-11-02 – 2017-11-03 (×3): 0.2 mg via INTRAVENOUS
  Filled 2017-11-02 (×5): qty 1

## 2017-11-02 MED ORDER — GLYCOPYRROLATE 0.2 MG/ML IJ SOLN: 0.2000 mg | INTRAMUSCULAR | Status: DC | PRN

## 2017-11-02 MED ORDER — LORAZEPAM 2 MG/ML IJ SOLN
2.0000 mg | Freq: Four times a day (QID) | INTRAMUSCULAR | Status: DC
  Administered 2017-11-02 – 2017-11-03 (×4): 2 mg via INTRAVENOUS
  Filled 2017-11-02 (×4): qty 1

## 2017-11-02 MED ORDER — GLYCOPYRROLATE 0.2 MG/ML IJ SOLN: 0.2000 mg | INTRAMUSCULAR | Status: DC

## 2017-11-02 NOTE — Procedures (Signed)
  Electroencephalogram report- LTM    Data acquisition: 10-20 electrode placement.  Additional T1, T2, and EKG electrodes; 26 channel digital referential acquisition reformatted to 18 channel/7 channel coronal bipolar     Spike detection: ON     Seizure detection: ON   Beginning time: 11/01/2017 a 08 25 am  Ending time: 11/02/2017 at 8: 19 AM  CPT: 17915 Day of study: Day 2   Thisintensive EEG monitoring with simultaneous video monitoring was performed for this patient with spells company by head shaking and mild restriction to rule out clinical subclinical seizures. Medications as per EMR  Day 1 : Several pushbutton activation events were noted.  These were activated by family and staff in response to rhythmic head jerking and mouth twitching.  Electrographically there is a ictal pattern noted with buildup of polyspike and wave discharges across left mid temporal and central parietal region involving in morphology frequency and amplitude however continue to be confined to the left hemisphere.  Movement artifact was present across right hemisphere based on head position.    In addition there is also multiple electrographic seizures were present with very subtle on no clinical manifestations with the same EEG pattern lateralizing to the left hemisphere localizing to the left temporal parietal region.  Seizures occur on average 2-3 seizures an hour during first half of the recording.  Seizures become less frequent during the second half of the recording and occur every 1-2 hours.  Last seizure was around 7 AM.  Seizure prior to that was around 3 AM.  Day 2: There is improvement in seizures frequency.  Now seizures occur with frequency 1 seizure every 2-3 hours.  Seizures manifest similarly clinically and electrographically lateralizing to left hemisphere localizing to the left parietal cortex.  Last seizure was at 4 AM.  Interictal spikes present frequently across the left parietal  cortex  Clinical interpretation: This  intensive EEG monitoring was simultaneous with video monitoring recorded clinical and electrographic seizures unchanged from previously recorded in clinical and electrographic manifestations however frequency had improved.  Seizures now occur one seizure every 2-3 hours.  They remain confined to the left hemisphere.  Last seizure at 4 AM.  Clinical correlation is advised.

## 2017-11-02 NOTE — Progress Notes (Signed)
Daily Progress Note   Patient Name: [name]       Date: 11/02/2017 DOB: 28-Nov-1940  Age: 77 y.o. MRN#: [id number] Attending Physician: Ditty, Kevan Ny, MD Primary Care Physician: Loraine Leriche., MD Admit Date: 10/26/2017  Reason for Consultation/Follow-up: Establishing goals of care, Terminal Care and Withdrawal of life-sustaining treatment  Subjective: Met with patient's spouseJeannene Patella, daughter- Abigail Butts, son- Nicki Reaper, as well as patient's brother and sister in Sports [employer].  Dr. Rory Percy also present and discussed patient's prognosis and answered family's questions- greatly appreciated.  At close of conversation- family determined that one way extubation and transition of care to comfort care would best align with patient's [place].  Discussed stopping medications, IV fluids, artificial feeding. Discussed symptom management- they are distressed by patient's increasing secretions. They have also noticed he is significantly more at ease after administration of lorazepam.  Described process of extubation and answered family's questions related to disposition. If patient does not die immediately they would prefer transfer to residential hospice facility after extubation. We discussed that patient may die very quickly after extubation- however, he may linger. They expressed concerns about what level of care patient would receive if transferred to another floor after extubation.    Review of Systems  Unable to perform ROS: Intubated    Length of Stay: 6  Current Medications: Scheduled Meds:  . chlorhexidine gluconate (MEDLINE KIT)  15 mL Mouth Rinse BID  . feeding supplement (PRO-STAT SUGAR FREE 64)  30 mL Per Tube 5 X Daily  . feeding supplement (VITAL HIGH PROTEIN)  1,000 mL Per Tube Q24H   . finasteride  5 mg Oral Daily  . furosemide  40 mg Intravenous Q12H  . insulin aspart  0-20 Units Subcutaneous Q4H  . insulin aspart  8 Units Subcutaneous Q4H  . insulin glargine  10 Units Subcutaneous QHS  . LORazepam  2 mg Intravenous Q6H  . losartan  100 mg Oral Daily  . mouth rinse  15 mL Mouth Rinse Q2H  . metoprolol tartrate  100 mg Per Tube BID  . pantoprazole (PROTONIX) IV  40 mg Intravenous QHS  . phenytoin (DILANTIN) IV  100 mg Intravenous Q8H  . potassium chloride  40 mEq Per Tube BID    Continuous Infusions: .  ceFAZolin (ANCEF) IV Stopped (11/02/17 1129)  . dexmedetomidine (  PRECEDEX) IV infusion    . lacosamide (VIMPAT) IV Stopped (11/02/17 1210)  . levETIRAcetam Stopped (11/02/17 1047)  . phenylephrine (NEO-SYNEPHRINE) Adult infusion Stopped (10/31/17 1507)    PRN Meds: acetaminophen (TYLENOL) oral liquid 160 mg/5 mL, bisacodyl, fentaNYL (SUBLIMAZE) injection, hydrALAZINE, labetalol, LORazepam, ondansetron **OR** ondansetron (ZOFRAN) IV, promethazine  Physical Exam  Constitutional: He appears well-developed and well-nourished.  HENT:  Healing craniotomy incision to L scalp  Pulmonary/Chest: Breath sounds normal.  Dried secretions noted on face- copious secretions through ET suction  Abdominal: Soft. Bowel sounds are normal.  Neurological:  Not responsive  Skin: Skin is warm and dry.  Psychiatric:  Non responsive  Nursing note and vitals reviewed.           Vital Signs: BP 110/68   Pulse (!) 130   Temp (!) 101 F (38.3 C) (Axillary)   Resp 20   Ht _0  (1.778 m)   Wt 112.5 kg (248 lb 0.3 oz)   SpO2 98%   BMI 35.59 kg/m  SpO2: SpO2: 98 % O2 Device: O2 Device: Ventilator O2 Flow Rate:    Intake/output summary:   Intake/Output Summary (Last 24 hours) at 11/02/2017 1359 Last data filed at 11/02/2017 1140 Gross per 24 hour  Intake 1280 ml  Output 2925 ml  Net -1645 ml   LBM: Last BM Date: 11/01/17 Baseline Weight: Weight: 113.2 kg (249 lb 9  oz) Most recent weight: Weight: 112.5 kg (248 lb 0.3 oz)       Palliative Assessment/Data: PPS: 10%     Patient Active Problem List   Diagnosis Date Noted  . Advance care planning   . Goals of care, counseling/discussion   . Palliative care by specialist   . Seizures ([place])   . Atrial fibrillation ([place]) 10/28/2017  . Endotracheally intubated 10/28/2017  . Subdural hematoma ([place]) 10/12/2017  . OBSTRUCTIVE SLEEP APNEA 05/28/2010  . PSA, INCREASED 11/30/2009  . LOW BACK PAIN SYNDROME 08/02/2008  . NECK SPRAIN AND STRAIN 08/02/2008  . POLYP, COLON 01/18/2007  . Type 2 diabetes mellitus ([place]) 01/18/2007  . HYPERLIPIDEMIA 01/18/2007  . Hypertension 01/18/2007  . DIVERTICULOSIS, COLON 01/18/2007    Palliative Care Assessment & Plan   Patient Profile: 77 y.o. male  with past medical history of DM, HTN, dislipidemia. A. fib  admitted on 11/05/2017 after being found unresponsive at home. Workup revealed large left subdural hematoma. Craniotomy performed. Has remained vent dependent and non-responsive with little sedation. Having seizures originating from area of bleed. Neurology discussed possible deep sedation and burst suppression. Palliative consulted per family request for [place].     Assessment/Recommendations/Plan   DNR  Minimal transition to comfort care tonight with-   d/c of continuous EEG, d/c maintenance medications  no MRI,   scheduled lorazepam 61m IV q6hr;   Robinul .[id number] prn secretions   Continue all other medications and interventions as ordered  Plan for extubation and full transition to comfort care tomorrow morning 9:30-10am  Appreciate presence and assistance from Dr. ArRory Percyuring [place]  Goals of Care and Additional Recommendations:  Limitations on Scope of Treatment: No Lab Draws, No Surgical Procedures and No Tracheostomy  Code Status:  DNR  Prognosis:   Hours - Days  Discharge Planning:  Anticipated Hospital Death  Care plan  was discussed with patient's family, Dr. ArRory Percy  Thank you for allowing the Palliative Medicine Team to assist in the care of this patient.   Time In: 1300 Time Out: 1415 Total Time 75  mins Prolonged Time Billed Yes      Greater than 50%  of this time was spent counseling and coordinating care related to the above assessment and plan.  Mariana Kaufman, AGNP-C Palliative Medicine   Please contact Palliative Medicine Team phone at [phone] for questions and concerns.

## 2017-11-02 NOTE — Progress Notes (Signed)
LTM checked. O2 was off and A2 needed reapplied as well. No skin breakdown seen.

## 2017-11-02 NOTE — Progress Notes (Signed)
vLTM EEG complete. No skin breakdown 

## 2017-11-02 NOTE — Progress Notes (Signed)
PULMONARY / CRITICAL CARE MEDICINE   Name: [name] MRN: [id number] DOB: 24-Oct-1940    ADMISSION DATE:  10/28/2017 CONSULTATION DATE:  1/17  REFERRING MD:  Ditty   CHIEF COMPLAINT:  Vent management   HISTORY OF PRESENT ILLNESS:   77 year old male with history of DM, hypertension, A. fib on Coumadin, hyperlipidemia presented 1/17 to the ER as code stroke after he was found unresponsive.  Of note he had a fall 1/16 at which time he came to the ER and had negative head CT.  He was discharged home.  Woke up on the morning of admission with a headache and went to bed, was then found unresponsive.  Was found to have large left subdural hematoma with midline shift. KCentra given.  Seen in consultation by neurosurgery and taken urgently to the OR for evacuation of SDH.  He remained intubated postop and critical care consulted for vent and medical management.    SUBJECTIVE:  EEG showed seizures overnight last night despite the presence of three anti convulsants. Both Neurology and Neurosurgery feel that we are looking at a poor outcome even if we pursue more aggressive seizure control. I have discussed this with the patient's wife who will meet with myself and Palliative care at 13:00 to discuss withdrawal of care   VITAL SIGNS: BP (!) 170/77   Pulse (!) 115   Temp 99.6 F (37.6 C)   Resp 19   Ht 5\' 10"  (1.778 m)   Wt 248 lb 0.3 oz (112.5 kg)   SpO2 99%   BMI 35.59 kg/m   HEMODYNAMICS:    VENTILATOR SETTINGS: Vent Mode: PRVC FiO2 (%):  [30 %] 30 % Set Rate:  [15 bmp] 15 bmp Vt Set:  [580 mL] 580 mL PEEP:  [5 cmH20] 5 cmH20 Plateau Pressure:  [12 cmH20-14 cmH20] 14 cmH20  INTAKE / OUTPUT:  Intake/Output Summary (Last 24 hours) at 11/02/2017 1047 Last data filed at 11/02/2017 1032 Gross per 24 hour  Intake 1380 ml  Output 3075 ml  Net -1695 ml     PHYSICAL EXAMINATION:  General: obese male, orally intubated and mechanically ventilated.  No overt seizure activity  during my visit.  No overt distress.   HEENT: Scalp staples in place.  PERRL.  MMM. No facial twitching noted this AM Neuro: Unresponsive, does not follow any commands.  No response to sternal rub. CV: Rapid and irregularly irregular , no M/R/G PULM: even/non-labored, lungs bilaterally coarse rhonchi ZT:IWPY, non-tender, bsx4 active obese Extremities: warm/dry, 2+ edema areas of ecchymosis Skin: no rashes or lesions   LABS:  BMET Recent Labs  Lab 10/30/17 0851 10/31/17 0428 11/02/17 0250  NA 145 149* 153*  K 3.6 3.6 3.6  CL 111 112* 112*  CO2 25 25 27   BUN 26* 27* 37*  CREATININE 0.87 0.93 0.91  GLUCOSE 216* 246* 243*    Electrolytes Recent Labs  Lab 10/30/17 0851 10/31/17 0428 11/02/17 0250  CALCIUM 8.1* 8.5* 8.8*  MG 1.6* 1.8 1.7  PHOS 1.9* 2.9 2.6    CBC Recent Labs  Lab 10/30/17 0851 10/31/17 0428 11/02/17 0250  WBC 10.6* 11.2* 12.1*  HGB 9.7* 10.1* 10.3*  HCT 29.9* 31.5* 33.9*  PLT 143* 167 200    Coag's Recent Labs  Lab 10/26/2017 1300  10/28/17 0406 10/29/17 0520 10/31/17 1123  APTT 39*  --   --   --   --   INR 2.68   < > 1.19 1.25 1.17   < > =  values in this interval not displayed.    Sepsis Markers Recent Labs  Lab 10/11/2017 1255 10/28/17 1027  LATICACIDVEN 2.54*  --   PROCALCITON  --  0.22    ABG Recent Labs  Lab 10/29/17 0425 10/31/17 1328 11/02/17 0355  PHART 7.382 7.402 7.477*  PCO2ART 40.3 39.9 42.4  PO2ART 213* 94.0 84.6    Liver Enzymes Recent Labs  Lab 10/15/2017 1228 10/29/17 0520  AST 32 19  ALT 27 14*  ALKPHOS 68 44  BILITOT 2.2* 2.0*  ALBUMIN 3.9 2.9*    Cardiac Enzymes No results for input(s): TROPONINI, PROBNP in the last 168 hours.  Glucose Recent Labs  Lab 11/01/17 1219 11/01/17 1551 11/01/17 2014 11/01/17 2359 11/02/17 0337 11/02/17 0753  GLUCAP 238* 163* 151* 252* 226* 148*    Imaging Dg Chest Port 1 View  Result Date: 11/02/2017 CLINICAL DATA:  Respiratory failure EXAM: PORTABLE  CHEST 1 VIEW COMPARISON:  11/01/2017 FINDINGS: Cardiac shadow is stable. Aortic calcifications are again seen. Endotracheal tube and nasogastric catheter remain in satisfactory position. The lungs are well aerated bilaterally without focal infiltrate or effusion. No bony abnormality is noted. IMPRESSION: No acute abnormality seen at this time. Electronically Signed   By: Inez Catalina M.D.   On: 11/02/2017 08:01   Dg Abd Portable 1v  Result Date: 11/01/2017 CLINICAL DATA:  OG tube placement EXAM: PORTABLE ABDOMEN - 1 VIEW COMPARISON:  11/01/2017 FINDINGS: Esophageal tube is looped upon itself, the tip projects over the distal stomach. Decreased bowel gas in the upper abdomen IMPRESSION: Esophageal tube looped upon itself with the tip projecting over the distal stomach Electronically Signed   By: Donavan Foil M.D.   On: 11/01/2017 18:20   Dg Abd Portable 1v  Result Date: 11/01/2017 CLINICAL DATA:  Orogastric tube placement EXAM: PORTABLE ABDOMEN - 1 VIEW COMPARISON:  KUB of 10/28/2017 FINDINGS: The tip of the OG tube overlies the region of the distal esophagus and could be advanced approximately 15-20 cm. The bowel gas pattern is nonspecific. No opaque calculi are seen. IMPRESSION: OG tube tip overlies the distal esophagus and needs to be advanced. Electronically Signed   By: Ivar Drape M.D.   On: 11/01/2017 15:04    STUDIES:  CT head 1/17 > left SDH, mass effect with uncal herniation, 2.6cm rightward MLS, 3cm hemorrhagic contusion in left temporal lobe. EEG 1/18 > mod generalized low voltage slowing of brain activity.  No non-convulsive status. EEG 1/21 > diffuse cerebral dysfunction.  Slowing over left hemisphere, lateralized periodic discharges over left mid temporal region, seizure arising from left temporal region lasting 60 seconds. Brain MRI 1/22 >   CULTURES: UC 1/18>>> negative  ANTIBIOTICS: Ancef 1/17 > peri-op   SIGNIFICANT EVENTS: 1/17 OR>>> crani, evacuation of L SDH  1/21 >  seizing more frequently to face, mouth, left foot.  Loaded with fosphenytoin, keppra increased, neuro consulted  LINES/TUBES: ETT 1/17 >> Orogastric tube 11/10/2017>>  DISCUSSION: 77 year old male with large SDH in setting fall on Coumadin.  s/p OR for craniotomy and evacuation of subdural hematoma by neurosurgery.  Remains vented postop. 1/21 having more frequent seizure activity to face, mouth, left foot - consulted neuro, loaded with fosphyenytoin, keppra increased, ativan added PRN.   ASSESSMENT / PLAN:   Large left subdural hematoma with midline shift in setting fall on Coumadin - status post OR for evacuation.  Follow-up CT head 1/18 without evidence of worsening, expected postoperative changes.  He has persistent seizures overnight despite the presence of  3 anticonvulsants.  Family is declining further aggressive care and we will be meeting with all family members later today to discuss conversion to comfort measures only status.  Acute respiratory failure- remains on vent post neurosurgery for evacuation of subdural hematoma Hx OSA - of note, has hx noncompliance over past 3 years with CPAP Plan Continue full ventilator support for now pending input from family.  (no weaning trials given seizures)   Hypertension Plan: Continue labetalol, cozaar, lopressor  Chronic atrial fibrillation (on warfarin) Plan Continue Lopressor (added 1/20) Continue telemetry monitoring Hold anticoagulation until cleared by surgery to resume Due to frequent falls he may not be a candidate for further anticoagulation  Hypernatremia Volume overload - +6L positive since admit, responded well to 40mg  lasix BID on 1/21 Plan: Continue diuresis  Fever and leukocytosis - resolved. Plan: Continue to monitor off abx  Anemia of chronic disease Plan Trend CBC Transfuse per protocol  DM with hyperglycemia Plan Sliding scale insulin increased 1/20   CC time: 32 min.   Lars Masson, MD [place]  [facility] Pager: [phone]  or [phone] 11/02/2017, 10:47 AM

## 2017-11-02 NOTE — Progress Notes (Signed)
OT Cancellation    11/02/17 1100  OT Visit Information  Last OT Received On 11/02/17  Reason Eval/Treat Not Completed Patient not medically ready. Pt on continuous EEG and not medically ready. Will cancel OT orders. Please re-consult when pt medically appropiate. Thank you.   [place], OTR/L Acute Rehab Pager: [phone] Office: [phone]

## 2017-11-02 NOTE — Progress Notes (Signed)
Neurology Progress Note   S:// Patient seen and examined.  Continues to have some clinical seizures. One clinical seizure noted lasting about 10 seconds with right facial twitching during this examination.   O:// Current vital signs: BP (!) 145/98   Pulse (!) 106   Temp 99.6 F (37.6 C)   Resp 15   Ht 5' 10"  (1.778 m)   Wt 112.5 kg (248 lb 0.3 oz)   SpO2 98%   BMI 35.59 kg/m  Vital signs in last 24 hours: Temp:  [99.4 F (37.4 C)-100.4 F (38 C)] 99.6 F (37.6 C) (01/23 0800) Pulse Rate:  [57-136] 106 (01/23 0900) Resp:  [15-25] 15 (01/23 0900) BP: (93-197)/(59-111) 145/98 (01/23 0900) SpO2:  [95 %-100 %] 98 % (01/23 0900) FiO2 (%):  [30 %] 30 % (01/23 0754) Weight:  [112.5 kg (248 lb 0.3 oz)] 112.5 kg (248 lb 0.3 oz) (01/23 0700) Neurological exam remains poor and unchanged. Mental Status: Patient does not respond to verbal stimuli. Does not respond to deep sternal rub. Does not follow commands. No verbalizations noted.  10-second facial twitching with EEG correlate on the bedside video EEG noted. Cranial Nerves: II: patient does not respond confrontation bilaterally, pupils right54m, left 225mand sluggishly reactivebilaterally III,IV,VI: doll's response absent bilaterally.  V,VII: corneal reflexpresentbilaterally VIII: patient does not respond to verbal stimuli IX,X: gag reflexpresent, XI: trapezius strength unable to test bilaterally XII: tongue strength unable to test Motor: Extremities flaccid throughout.As noted bilateral triple reflex when stimulated  sensory: Does not respond to noxious stimuli in any extremity. Deep Tendon Reflexes:  Absent throughout. Plantars: upgoingbilaterally Cerebellar: Unable to perform  Medications  Current Facility-Administered Medications:  .  acetaminophen (TYLENOL) solution 650 mg, 650 mg, Per Tube, Q4H PRN, JeRenee PainMD, 650 mg at 11/01/17 1732 .  bisacodyl (DULCOLAX) suppository 10 mg, 10 mg, Rectal,  Daily PRN, JeNewman PiesMD, 10 mg at 10/30/17 0953 .  ceFAZolin (ANCEF) IVPB 2g/100 mL premix, 2 g, Intravenous, Q8H, Ditty, BeKevan NyMD, Stopped at 11/02/17 0110 .  chlorhexidine gluconate (MEDLINE KIT) (PERIDEX) 0.12 % solution 15 mL, 15 mL, Mouth Rinse, BID, HoCorey HaroldNP, 15 mL at 11/02/17 0727 .  dexmedetomidine (PRECEDEX) 200 MCG/50ML (4 mcg/mL) infusion, 0.2-0.7 mcg/kg/hr, Intravenous, Continuous, JeNewman PiesMD .  docusate (COLACE) 50 MG/5ML liquid 100 mg, 100 mg, Oral, BID, Ditty, BeKevan NyMD, 100 mg at 11/01/17 2108 .  feeding supplement (PRO-STAT SUGAR FREE 64) liquid 30 mL, 30 mL, Per Tube, 5 X Daily, JeRenee PainMD, 30 mL at 11/02/17 0543 .  feeding supplement (VITAL HIGH PROTEIN) liquid 1,000 mL, 1,000 mL, Per Tube, Q24H, BaSalvadore Dom, NP, 1,000 mL at 11/01/17 1029 .  fentaNYL (SUBLIMAZE) injection 50 mcg, 50 mcg, Intravenous, Q2H PRN, HoCorey HaroldNP, 50 mcg at 10/30/17 0303 .  finasteride (PROSCAR) tablet 5 mg, 5 mg, Oral, Daily, Ditty, BeKevan NyMD, 5 mg at 11/01/17 0926 .  folic acid (FOLVITE) tablet 1 mg, 1 mg, Oral, Daily, JeNewman PiesMD, 1 mg at 11/01/17 0925 .  furosemide (LASIX) injection 40 mg, 40 mg, Intravenous, Q12H, Desai, Rahul P, PA-C, 40 mg at 11/01/17 2107 .  hydrALAZINE (APRESOLINE) injection 10-40 mg, 10-40 mg, Intravenous, Q4H PRN, Desai, Rahul P, PA-C, 20 mg at 11/01/17 1821 .  insulin aspart (novoLOG) injection 0-20 Units, 0-20 Units, Subcutaneous, Q4H, AlRigoberto NoelMD, 3 Units at 11/02/17 0854 .  insulin aspart (novoLOG) injection 8 Units, 8 Units, Subcutaneous, Q4H, Minor, WiGrace Bushy  NP, 8 Units at 11/02/17 0854 .  insulin glargine (LANTUS) injection 10 Units, 10 Units, Subcutaneous, QHS, Desai, Rahul P, PA-C, 10 Units at 11/01/17 2121 .  labetalol (NORMODYNE,TRANDATE) injection 20 mg, 20 mg, Intravenous, Q10 min PRN, Anders Simmonds, MD, 20 mg at 11/02/17 0854 .  lacosamide (VIMPAT) 200 mg in sodium  chloride 0.9 % 25 mL IVPB, 200 mg, Intravenous, Q12H, Greta Doom, MD, Stopped at 11/01/17 2300 .  levETIRAcetam (KEPPRA) 1,500 mg in sodium chloride 0.9 % 100 mL IVPB, 1,500 mg, Intravenous, Q12H, Greta Doom, MD, Stopped at 11/01/17 2124 .  LORazepam (ATIVAN) injection 0-2 mg, 0-2 mg, Intravenous, Q1H PRN, Desai, Rahul P, PA-C .  losartan (COZAAR) tablet 100 mg, 100 mg, Oral, Daily, Anders Simmonds, MD, 100 mg at 11/01/17 0924 .  magnesium oxide (MAG-OX) tablet 400 mg, 400 mg, Per Tube, Daily, Erick Colace, NP, 400 mg at 11/01/17 0925 .  MEDLINE mouth rinse, 15 mL, Mouth Rinse, Q2H, Newman Pies, MD, 15 mL at 11/02/17 0543 .  metoprolol tartrate (LOPRESSOR) 25 mg/10 mL oral suspension 100 mg, 100 mg, Per Tube, BID, Erick Colace, NP, 100 mg at 11/01/17 2108 .  naloxone [facility]) injection 0.08 mg, 0.08 mg, Intravenous, PRN, Ditty, Kevan Ny, MD .  ondansetron (ZOFRAN) tablet 4 mg, 4 mg, Oral, Q4H PRN **OR** ondansetron (ZOFRAN) injection 4 mg, 4 mg, Intravenous, Q4H PRN, Ditty, Kevan Ny, MD .  pantoprazole (PROTONIX) injection 40 mg, 40 mg, Intravenous, QHS, Ditty, Kevan Ny, MD, 40 mg at 11/01/17 2107 .  phenylephrine (NEO-SYNEPHRINE) 10 mg in sodium chloride 0.9 % 250 mL (0.04 mg/mL) infusion, 0-400 mcg/min, Intravenous, Titrated, Sampson Goon, MD, Stopped at 10/31/17 1507 .  phenytoin (DILANTIN) injection 100 mg, 100 mg, Intravenous, Q8H, Marliss Coots, PA-C, 100 mg at 11/02/17 0543 .  potassium chloride 20 MEQ/15ML (10%) solution 40 mEq, 40 mEq, Per Tube, BID, Desai, Rahul P, PA-C, 40 mEq at 11/01/17 2107 .  promethazine (PHENERGAN) tablet 12.5-25 mg, 12.5-25 mg, Oral, Q4H PRN, Ditty, Kevan Ny, MD .  senna [facility]) tablet 8.6 mg, 1 tablet, Oral, BID, Ditty, Kevan Ny, MD, 8.6 mg at 11/01/17 2111 .  sodium phosphate (FLEET) 7-19 GM/118ML enema 1 enema, 1 enema, Rectal, Once PRN, Ditty, Kevan Ny, MD .  thiamine (VITAMIN B-1)  tablet 100 mg, 100 mg, Oral, Daily, Newman Pies, MD, 100 mg at 11/01/17 0924 .  vitamin C (ASCORBIC ACID) tablet 500 mg, 500 mg, Oral, QODAY, Ditty, Kevan Ny, MD, 500 mg at 11/01/17 0923 Labs CBC    Component Value Date/Time   WBC 12.1 (H) 11/02/2017 0250   RBC 3.12 (L) 11/02/2017 0250   HGB 10.3 (L) 11/02/2017 0250   HCT 33.9 (L) 11/02/2017 0250   PLT 200 11/02/2017 0250   MCV 108.7 (H) 11/02/2017 0250   MCH 33.0 11/02/2017 0250   MCHC 30.4 11/02/2017 0250   RDW 14.6 11/02/2017 0250   LYMPHSABS 1.1 10/31/2017 0428   MONOABS 1.2 (H) 10/31/2017 0428   EOSABS 0.1 10/31/2017 0428   BASOSABS 0.0 10/31/2017 0428    CMP     Component Value Date/Time   NA 153 (H) 11/02/2017 0250   K 3.6 11/02/2017 0250   CL 112 (H) 11/02/2017 0250   CO2 27 11/02/2017 0250   GLUCOSE 243 (H) 11/02/2017 0250   GLUCOSE 182 (H) 10/18/2006 1016   BUN 37 (H) 11/02/2017 0250   CREATININE 0.91 11/02/2017 0250   CALCIUM 8.8 (L) 11/02/2017 0250  PROT 5.4 (L) 10/29/2017 0520   ALBUMIN 2.9 (L) 10/29/2017 0520   AST 19 10/29/2017 0520   ALT 14 (L) 10/29/2017 0520   ALKPHOS 44 10/29/2017 0520   BILITOT 2.0 (H) 10/29/2017 0520   GFRNONAA >60 11/02/2017 0250   GFRAA >60 11/02/2017 0250   LTM EEG from the past 24 hours reported similar findings as the prior Report pasted below.  "Clinical interpretation: This  intensive EEG monitoring was simultaneous with video monitoring recorded clinical and electrographic seizures unchanged from previously recorded in clinical and electrographic manifestations however frequency had improved.  Seizures now occur one seizure every 2-3 hours.  They remain confined to the left hemisphere.  Last seizure at 4 AM.  Clinical correlation is advised."  Imaging I have reviewed images in epic and the results pertinent to this consultation are: CT-scan of the brain -postprocedure CT scan shows evacuation of the left subdural hematoma with minimal residual fluid and 7.8 mm  shift  Assessment and plan: 77 year old man with a left subdural hematoma status post craniotomy day 7 today showing ongoing signs of clinical seizures and electrographic seizures, the frequency of which has reduced from what it initially was but he continues to have seizures once every 2-3 hours, short in duration and always originating from the left side. He is currently on Keppra Dilantin and Vimpat for his antiepileptics. According to his neurosurgeon, neurosurgical he does not have a very good prognosis given the size of his bleed and poor exam this far out of surgery without any sedation. I agree with the poor exam and the fact that this far out from his surgery, not on sedation minimal exam with only brainstem function intact does not provide him a good chance for a neurologically meaningful recovery. Family meeting has been set for 1 PM today with palliative care team and critical care medicine. I have discussed all the findings of my exam, imaging with the family and Dr. Cyndy Freeze. I would meet with the family again with the palliative and P CCM teams during the meeting to decide the further course of action.  Going forward-if decision is made to withdraw care or limit care, no further workup neurologically is necessary. But if a decision is made to continue aggressive treatment, the next step would be to pursue burst suppression with propofol/Versed drip, which in my mind will probably not be as useful given the fact that the structural abnormalities still exists and will keep irritating the cortex in that area and will act as a seizure focus irrespective.  At that time, an MRI can also be considered once the EEG leads come off but at this time I do not see the utility of pursuing an MRI.  For now continue the LTM till the family meeting is done, continue with the 3 antiepileptics.  We will continue to follow.  -- Amie Portland, MD [facility] Pager: [phone] If 7pm to 7am,  please call on call as listed on AMION.  CRITICAL CARE ATTESTATION This patient is critically ill and at significant risk of neurological worsening, death and care requires constant monitoring of vital signs, hemodynamics,respiratory and cardiac monitoring. I spent 40  minutes of neurocritical care time performing neurological assessment, discussion with family, other specialists and medical decision making of high complexityin the care of  this patient.

## 2017-11-02 NOTE — Progress Notes (Signed)
FAMILY MEETING NOTE  Attended family meeting at 1 PM. Answered all questions that the family members had. Explained the poor prognosis. Gather their opinion on what would like to proceed. Meeting continued with palliative later on. Received page from palliative provider that family has decided to go comfort measures tomorrow morning. At this time, we will discontinue the LTM. Continue with the antiepileptic medications for now. Call neurology as needed.  -- Amie Portland, MD [facility] Pager: [phone] If 7pm to 7am, please call on call as listed on AMION.

## 2017-11-02 NOTE — Progress Notes (Signed)
RN requested ETT tube holder changed due to sliding towards pt's mouth.  RT notified RN of a skin abraision on the pt's left cheek bone area and a large scab on the center portion of his upper lip.  RT expressed concern that the tube holder would need to stay away from the center upper lip.  ETT holder changed, due to nasal and oral secretions the left side of the tube holder slid down.  ETT still secure.  RN notified.

## 2017-11-03 DIAGNOSIS — Z7189 Other specified counseling: Secondary | ICD-10-CM

## 2017-11-03 DIAGNOSIS — Z515 Encounter for palliative care: Secondary | ICD-10-CM

## 2017-11-03 LAB — GLUCOSE, CAPILLARY
GLUCOSE-CAPILLARY: 223 mg/dL — AB (ref 65–99)
GLUCOSE-CAPILLARY: 208 mg/dL — AB (ref 65–99)
Glucose-Capillary: 271 mg/dL — ABNORMAL HIGH (ref 65–99)

## 2017-11-03 MED ORDER — GLYCOPYRROLATE 0.2 MG/ML IJ SOLN: 0.2000 mg | INTRAMUSCULAR | Status: DC | PRN

## 2017-11-03 MED ORDER — MORPHINE BOLUS VIA INFUSION
4.0000 mg | INTRAVENOUS | Status: AC
  Administered 2017-11-03: 4 mg via INTRAVENOUS
  Filled 2017-11-03: qty 4

## 2017-11-03 MED ORDER — HALOPERIDOL LACTATE 2 MG/ML PO CONC
0.5000 mg | ORAL | Status: DC | PRN
  Filled 2017-11-03: qty 0.3

## 2017-11-03 MED ORDER — MORPHINE BOLUS VIA INFUSION
2.0000 mg | INTRAVENOUS | Status: DC | PRN
  Administered 2017-11-03: 4 mg via INTRAVENOUS
  Filled 2017-11-03: qty 2

## 2017-11-03 MED ORDER — POLYVINYL ALCOHOL 1.4 % OP SOLN
1.0000 [drp] | Freq: Four times a day (QID) | OPHTHALMIC | Status: DC | PRN
  Filled 2017-11-03: qty 15

## 2017-11-03 MED ORDER — LORAZEPAM 2 MG/ML IJ SOLN
2.0000 mg | INTRAMUSCULAR | Status: AC
  Administered 2017-11-03: 2 mg via INTRAVENOUS
  Filled 2017-11-03: qty 1

## 2017-11-03 MED ORDER — LORAZEPAM 2 MG/ML IJ SOLN
0.5000 mg/h | INTRAMUSCULAR | Status: DC
  Administered 2017-11-03: 0.5 mg/h via INTRAVENOUS
  Filled 2017-11-03: qty 25

## 2017-11-03 MED ORDER — MORPHINE SULFATE 25 MG/ML IV SOLN
4.0000 mg/h | INTRAVENOUS | Status: DC
  Administered 2017-11-03: 4 mg/h via INTRAVENOUS
  Filled 2017-11-03: qty 10

## 2017-11-03 MED ORDER — BIOTENE DRY MOUTH MT LIQD: 15.0000 mL | OROMUCOSAL | Status: DC | PRN

## 2017-11-03 MED ORDER — LORAZEPAM BOLUS VIA INFUSION
1.0000 mg | INTRAVENOUS | Status: DC | PRN
  Filled 2017-11-03: qty 1

## 2017-11-03 MED ORDER — GLYCOPYRROLATE 1 MG PO TABS: 1.0000 mg | ORAL_TABLET | ORAL | Status: DC | PRN

## 2017-11-03 MED ORDER — HALOPERIDOL LACTATE 5 MG/ML IJ SOLN: 0.5000 mg | INTRAMUSCULAR | Status: DC | PRN

## 2017-11-03 MED ORDER — HALOPERIDOL 0.5 MG PO TABS: 0.5000 mg | ORAL_TABLET | ORAL | Status: DC | PRN

## 2017-11-11 NOTE — Discharge Summary (Signed)
Date of Admission: 10/30/2017  Date of Discharge: 11/07/17  Admission Diagnosis: Large left subdural hematoma with left temporal intraparenchymal hemorrhage; coma  Discharge Diagnosis: Same  Procedure Performed: Left craniotomy for evacuation of subdural hematoma and intraparenchymal hematoma  Attending: Aldean Ast, MD  Hospital Course:  The patient was admitted through the ER with a large left sided subdural hematoma and intraparenchymal hemorrhage.  He was comatose.  He was taken to the OR emergently for craniotomy and evacuation of the hematomas.  He never awoke after surgery.  EEG monitoring revealed he was having frequent seizures despite being on multiple medications.  His seizures were never able to be controlled.  After discussion with the family they chose to change his status to comfort care and mechanical ventilation was stopped.  He succumbed shortly thereafter.  Follow up: N/A  Allergies as of 11-26-2017   No Known Allergies     Medication List    ASK your doctor about these medications   ACCU-CHEK NANO SMARTVIEW w/Device Kit Check blood sugar daily. Dx Code: 250.00   ACCU-CHEK SMARTVIEW test strip Generic drug:  glucose blood USE TO TEST BLOOD SUGAR EVERY DAY   AUTO-LANCET Misc Test blood sugar daily. Dx Code: 250.00. Accu-Chek nano smart view device   B-D SINGLE USE SWABS REGULAR Pads Use one swab as directed   bisoprolol 10 MG tablet Commonly known as:  ZEBETA Take 10 mg by mouth daily.   Efinaconazole 10 % Soln Apply daily to the affected are for 28 days   finasteride 5 MG tablet Commonly known as:  PROSCAR Take 1 tablet (5 mg total) by mouth daily.   Fish Oil 1200 MG Caps Take 2 capsules by mouth 2 (two) times daily.   glipiZIDE 5 MG tablet Commonly known as:  GLUCOTROL Take 1 tablet (5 mg total) by mouth daily before breakfast.   losartan 100 MG tablet Commonly known as:  COZAAR Take 1 tablet (100 mg total) by mouth daily.   Magnesium  Oxide 500 MG Caps Take 1 capsule by mouth daily.   metFORMIN 500 MG 24 hr tablet Commonly known as:  GLUCOPHAGE-XR TAKE 2 TABLETS TWICE DAILY   metoprolol tartrate 100 MG tablet Commonly known as:  LOPRESSOR Take 100 mg by mouth 2 (two) times daily.   naproxen sodium 220 MG tablet Commonly known as:  ALEVE Take 220 mg by mouth 2 (two) times daily.   niacin 500 MG CR tablet Commonly known as:  NIASPAN Take 1 tablet (500 mg total) by mouth at bedtime.   sildenafil 100 MG tablet Commonly known as:  VIAGRA Take 1 tablet (100 mg total) by mouth as needed for erectile dysfunction.   sitaGLIPtin 100 MG tablet Commonly known as:  JANUVIA Take 100 mg by mouth every evening.   triamterene-hydrochlorothiazide 37.5-25 MG capsule Commonly known as:  DYAZIDE Take 1 each (1 capsule total) by mouth every morning.   vitamin C 500 MG tablet Commonly known as:  ASCORBIC ACID Take 500 mg by mouth every other day.   warfarin 5 MG tablet Commonly known as:  COUMADIN Take 5-7.5 mg by mouth daily. Alternating days

## 2017-11-11 NOTE — Progress Notes (Signed)
PULMONARY / CRITICAL CARE MEDICINE   Name: [name] MRN: [id number] DOB: 11/12/1940    ADMISSION DATE:  11/08/2017 CONSULTATION DATE:  1/17  REFERRING MD:  Ditty   CHIEF COMPLAINT:  Vent management   HISTORY OF PRESENT ILLNESS:   77 year old male with history of DM, hypertension, A. fib on Coumadin, hyperlipidemia presented 1/17 to the ER as code stroke after he was found unresponsive.  Of note he had a fall 1/16 at which time he came to the ER and had negative head CT.  He was discharged home.  Woke up on the morning of admission with a headache and went to bed, was then found unresponsive.  Was found to have large left subdural hematoma with midline shift. KCentra given.  Seen in consultation by neurosurgery and taken urgently to the OR for evacuation of SDH.  He remained intubated postop and critical care consulted for vent and medical management.  SUBJECTIVE:   Palliative care met with family yesterday.  Family at bedside.  Ready for withdrawal.   VITAL SIGNS: BP 107/69 (BP Location: Right Arm)   Pulse (!) 145   Temp (!) 100.7 F (38.2 C) (Oral) Comment: Alger Simons, RN notified  Resp 20   Ht _0  (1.778 m)   Wt 111.2 kg (245 lb 2.4 oz)   SpO2 97%   BMI 35.18 kg/m   HEMODYNAMICS:    VENTILATOR SETTINGS: Vent Mode: PRVC FiO2 (%):  [30 %] 30 % Set Rate:  [15 bmp] 15 bmp Vt Set:  [580 mL] 580 mL PEEP:  [5 cmH20] 5 cmH20 Plateau Pressure:  [12 cmH20-17 cmH20] 16 cmH20  INTAKE / OUTPUT:  Intake/Output Summary (Last 24 hours) at 11-27-17 0923 Last data filed at Nov 27, 2017 0800 Gross per 24 hour  Intake 1215 ml  Output 2845 ml  Net -1630 ml     PHYSICAL EXAMINATION:  General:  Chronically ill appearing obese male, NAD on vent  HEENT: MM pink/moist, ETT Neuro:  No response, off sedation  CV: s1s2 rrr, no m/r/g, tachy  PULM: even/non-labored, lungs bilaterally coarse  VE:LFYB, non-tender, bsx4 active  Extremities: warm/dry, mild generalized edema  Skin:  no rashes or lesions  LABS:  BMET Recent Labs  Lab 10/30/17 0851 10/31/17 0428 11/02/17 0250  NA 145 149* 153*  K 3.6 3.6 3.6  CL 111 112* 112*  CO2 _1 BUN 26* 27* 37*  CREATININE 0.87 0.93 0.91  GLUCOSE 216* 246* 243*    Electrolytes Recent Labs  Lab 10/30/17 0851 10/31/17 0428 11/02/17 0250  CALCIUM 8.1* 8.5* 8.8*  MG 1.6* 1.8 1.7  PHOS 1.9* 2.9 2.6    CBC Recent Labs  Lab 10/30/17 0851 10/31/17 0428 11/02/17 0250  WBC 10.6* 11.2* 12.1*  HGB 9.7* 10.1* 10.3*  HCT 29.9* 31.5* 33.9*  PLT 143* 167 200    Coag's Recent Labs  Lab 11/06/2017 1300  10/28/17 0406 10/29/17 0520 10/31/17 1123  APTT 39*  --   --   --   --   INR 2.68   < > 1.19 1.25 1.17   < > = values in this interval not displayed.    Sepsis Markers Recent Labs  Lab 10/29/2017 1255 10/28/17 1027  LATICACIDVEN 2.54*  --   PROCALCITON  --  0.22    ABG Recent Labs  Lab 10/29/17 0425 10/31/17 1328 11/02/17 0355  PHART 7.382 7.402 7.477*  PCO2ART 40.3 39.9 42.4  PO2ART 213* 94.0 84.6    Liver Enzymes Recent Labs  Lab 10/25/2017 1228 10/29/17 0520  AST 32 19  ALT 27 14*  ALKPHOS 68 44  BILITOT 2.2* 2.0*  ALBUMIN 3.9 2.9*    Cardiac Enzymes No results for input(s): TROPONINI, PROBNP in the last 168 hours.  Glucose Recent Labs  Lab 11/02/17 1213 11/02/17 1627 11/02/17 2010 11/02/17 2325 Nov 12, 2017 0343 November 12, 2017 0723  GLUCAP 216* 162* 196* 271* 223* 208*    Imaging No results found.  STUDIES:  CT head 1/17 > left SDH, mass effect with uncal herniation, 2.6cm rightward MLS, 3cm hemorrhagic contusion in left temporal lobe. EEG 1/18 > mod generalized low voltage slowing of brain activity.  No non-convulsive status. EEG 1/21 > diffuse cerebral dysfunction.  Slowing over left hemisphere, lateralized periodic discharges over left mid temporal region, seizure arising from left temporal region lasting 60 seconds. Brain MRI 1/22 >   CULTURES: UC 1/18>>>  negative  ANTIBIOTICS: Ancef 1/17 > peri-op   SIGNIFICANT EVENTS: 1/17 OR>>> crani, evacuation of L SDH  1/21 > seizing more frequently to face, mouth, left foot.  Loaded with fosphenytoin, keppra increased, neuro consulted  LINES/TUBES: ETT 1/17 >> Orogastric tube 10/11/2017>>  DISCUSSION: 77 year old male with large SDH in setting fall on Coumadin.  s/p OR for craniotomy and evacuation of subdural hematoma by neurosurgery.  Remains vented postop. 1/21 having more frequent seizure activity to face, mouth, left foot - consulted neuro, loaded with fosphyenytoin, keppra increased, ativan added PRN.   ASSESSMENT / PLAN:   Large left subdural hematoma with midline shift in setting fall on Coumadin - status post OR for evacuation. Course c/b persistent seizures despite the presence of 3 anticonvulsants.  Family is declining further aggressive care, have met with palliative care and ready for withdrawal and transition to comfort care.   Acute respiratory failure  Hx OSA  Large SDH  Hypertension Chronic AFib  Hypernatremia Volume overload - +6L positive since admit, responded well to 7m lasix BID on 1/21 Anemia of chronic disease DM with hyperglycemia    Now DNR.  Family ready for withdrawal.  Awaiting morphine gtt.   Withdrawal of life sustaining treatment protocol ordered.    Discussed at length with family at bedside.  All questions answered.     KNickolas Madrid NP 102/02/19 9:23 AM Pager: [phone] Attending Note:  77year old male with large left SDH with midline shift that is not a surgical candidate.  Patient is intubated with coarse BS on exam.  I reviewed CXR myself, ETT is in good position.  Discussed with PCCM-NP.  Family ready for withdrawal.  Spoke with family, full DNR, will start morphine and extubate to comfort.  PCCM will sign off, please call back if needed.  The patient is critically ill with multiple organ systems failure and  requires high complexity decision making for assessment and support, frequent evaluation and titration of therapies, application of advanced monitoring technologies and extensive interpretation of multiple databases.   Critical Care Time devoted to patient care services described in this note is  35  Minutes. This time reflects time of care of this signee Dr WJennet Maduro This critical care time does not reflect procedure time, or teaching time or supervisory time of PA/NP/Med student/Med Resident etc but could involve care discussion time.  WRush Farmer M.D. LNew Jersey State Prison HospitalPulmonary/Critical Care Medicine. Pager: [phone] After hours pager: [phone]

## 2017-11-11 NOTE — Progress Notes (Signed)
No acute events Family at bedside; prepared for withdrawal of care Agree with this decision as I do not believe there is hope for meaningful survival

## 2017-11-11 NOTE — Progress Notes (Signed)
Pt extubated per family request/withdrawal request orders.

## 2017-11-11 NOTE — Progress Notes (Signed)
Daily Progress Note   Patient Name: [name]       Date: 11/11/17 DOB: Oct 01, 1941  Age: 77 y.o. MRN#: [id number] Attending Physician: Ditty, Kevan Ny, MD Primary Care Physician: Loraine Leriche., MD Admit Date: 11/06/2017  Reason for Consultation/Follow-up: Withdrawal of life-sustaining treatment  Subjective: Family at bedside. Discussed extubation procedure. Family prepared for one-way extubation with expectation of death. Patient extubated to room air. Became immediately apneic, died at 70.  ROS  Length of Stay: 7  Current Medications: Scheduled Meds:  . chlorhexidine gluconate (MEDLINE KIT)  15 mL Mouth Rinse BID  . finasteride  5 mg Oral Daily  . furosemide  40 mg Intravenous Q12H  . glycopyrrolate  0.2 mg Intravenous Q4H  . mouth rinse  15 mL Mouth Rinse Q2H  . pantoprazole (PROTONIX) IV  40 mg Intravenous QHS  . phenytoin (DILANTIN) IV  100 mg Intravenous Q8H    Continuous Infusions: . lacosamide (VIMPAT) IV 200 mg (2017-11-11 1049)  . levETIRAcetam Stopped (11-Nov-2017 0936)  . LORazepam (ATIVAN) infusion 0.5 mg/hr (11/11/2017 1012)  . morphine 4 mg/hr (11-Nov-2017 0944)  . phenylephrine (NEO-SYNEPHRINE) Adult infusion Stopped (10/31/17 1507)    PRN Meds: acetaminophen (TYLENOL) oral liquid 160 mg/5 mL, antiseptic oral rinse, glycopyrrolate **OR** glycopyrrolate **OR** glycopyrrolate, haloperidol **OR** haloperidol **OR** haloperidol lactate, hydrALAZINE, labetalol, LORazepam, morphine, ondansetron **OR** ondansetron (ZOFRAN) IV, polyvinyl alcohol  Physical Exam          Vital Signs: BP 132/75   Pulse (!) 132   Temp (!) 100.7 F (38.2 C) (Oral) Comment: Alger Simons, RN notified  Resp (!) 23   Ht _0  (1.778 m)   Wt 111.2 kg (245 lb 2.4 oz)   SpO2  97%   BMI 35.18 kg/m  SpO2: SpO2: 97 % O2 Device: O2 Device: Ventilator O2 Flow Rate:    Intake/output summary:   Intake/Output Summary (Last 24 hours) at 2017-11-11 1118 Last data filed at 11-11-2017 0900 Gross per 24 hour  Intake 1020 ml  Output 2970 ml  Net -1950 ml   LBM: Last BM Date: 11/01/17 Baseline Weight: Weight: 113.2 kg (249 lb 9 oz) Most recent weight: Weight: 111.2 kg (245 lb 2.4 oz)       Palliative Assessment/Data: PPS: 10%   Flowsheet Rows     Most Recent Value  Intake Tab  Referral Department  Surgery  Palliative Care Primary Diagnosis  Neurology  Date Notified  10/31/17  Palliative Care Type  New Palliative care  Reason for referral  Clarify Goals of Care  Date of Admission  10/24/2017  Date first seen by Palliative Care  11/01/17  # of days Palliative referral response time  1 Day(s)  # of days IP prior to Palliative referral  4  Clinical Assessment  Psychosocial & Spiritual Assessment  Palliative Care Outcomes      Patient Active Problem List   Diagnosis Date Noted  . Terminal care   . Advance care planning   . Goals of care, counseling/discussion   . Palliative care by specialist   . Seizures ([place])   . Atrial fibrillation ([place]) 10/28/2017  . Endotracheally intubated 10/28/2017  . Subdural hematoma ([place]) 10/21/2017  . OBSTRUCTIVE SLEEP APNEA 05/28/2010  . PSA, INCREASED 11/30/2009  . LOW BACK PAIN SYNDROME 08/02/2008  . NECK SPRAIN AND STRAIN 08/02/2008  . POLYP, COLON 01/18/2007  . Type 2 diabetes mellitus ([place]) 01/18/2007  . HYPERLIPIDEMIA 01/18/2007  . Hypertension 01/18/2007  . DIVERTICULOSIS, COLON 01/18/2007    Palliative Care Assessment & Plan   Patient Profile: 77 y.o.malewith past medical history of DM, HTN, dislipidemia. A. fibadmitted on 1/17/2019after being found unresponsive at home. Workup revealed large left subdural hematoma. Craniotomy performed. Has remained vent dependent and non-responsive with little  sedation. Having seizures originating from area of bleed. Neurology discussed possible deep sedation and burst suppression. Palliative consulted per family request for [place].  Assessment/Recommendations/Plan   Extubation complete  Patient deceased  Code Status:  DNR  Prognosis:   Hours - Days  Discharge Planning:  Anticipated Hospital Death  Care plan was discussed with patient's family and RN.  Thank you for allowing the Palliative Medicine Team to assist in the care of this patient.   Time In: 0930, 1030 Time Out: 0945, 1115 Total Time 60 minutes Prolonged Time Billed Yes      Greater than 50%  of this time was spent counseling and coordinating care related to the above assessment and plan.  Mariana Kaufman, AGNP-C Palliative Medicine   Please contact Palliative Medicine Team phone at [phone] for questions and concerns.

## 2017-11-11 NOTE — Progress Notes (Signed)
40cc of ativan drip and 200cc of morphine drip wasted in sink. Witnessed by [employer]

## 2017-11-11 NOTE — Progress Notes (Signed)
Responding to [facility] and Chaplain referral to continue support.  Patient passed early morning.  Family at Bedside. Provided presence and listening.    11/21/2017 1144  Clinical Encounter Type  Visited With Patient and family together;Health care provider  Visit Type Follow-up;Spiritual support;Death  Referral From Chaplain;Nurse  Spiritual Encounters  Spiritual Needs Emotional;Grief support  Stress Factors  Family Stress Factors Loss  Cristopher Peru, [facility], Pager [phone]

## 2017-11-11 NOTE — Progress Notes (Signed)
Pt deceased. Time of death 1115AM. Pronounced by Alger Simons RN accompanied by Leonard Downing NP with palliative. Pt was extubated at 1106 and cardiac activity ceased at 1115. Family at bedside. CDS notified. Pt will be ME case.

## 2017-11-11 DEATH — deceased

## 2018-10-05 IMAGING — DX DG CHEST 1V PORT
1 series · 1 of 1 positions shown · non-contrast
Comparison: October 27, 2017

CLINICAL DATA: Hypoxia

EXAM:
PORTABLE CHEST 1 VIEW

[chest ap]
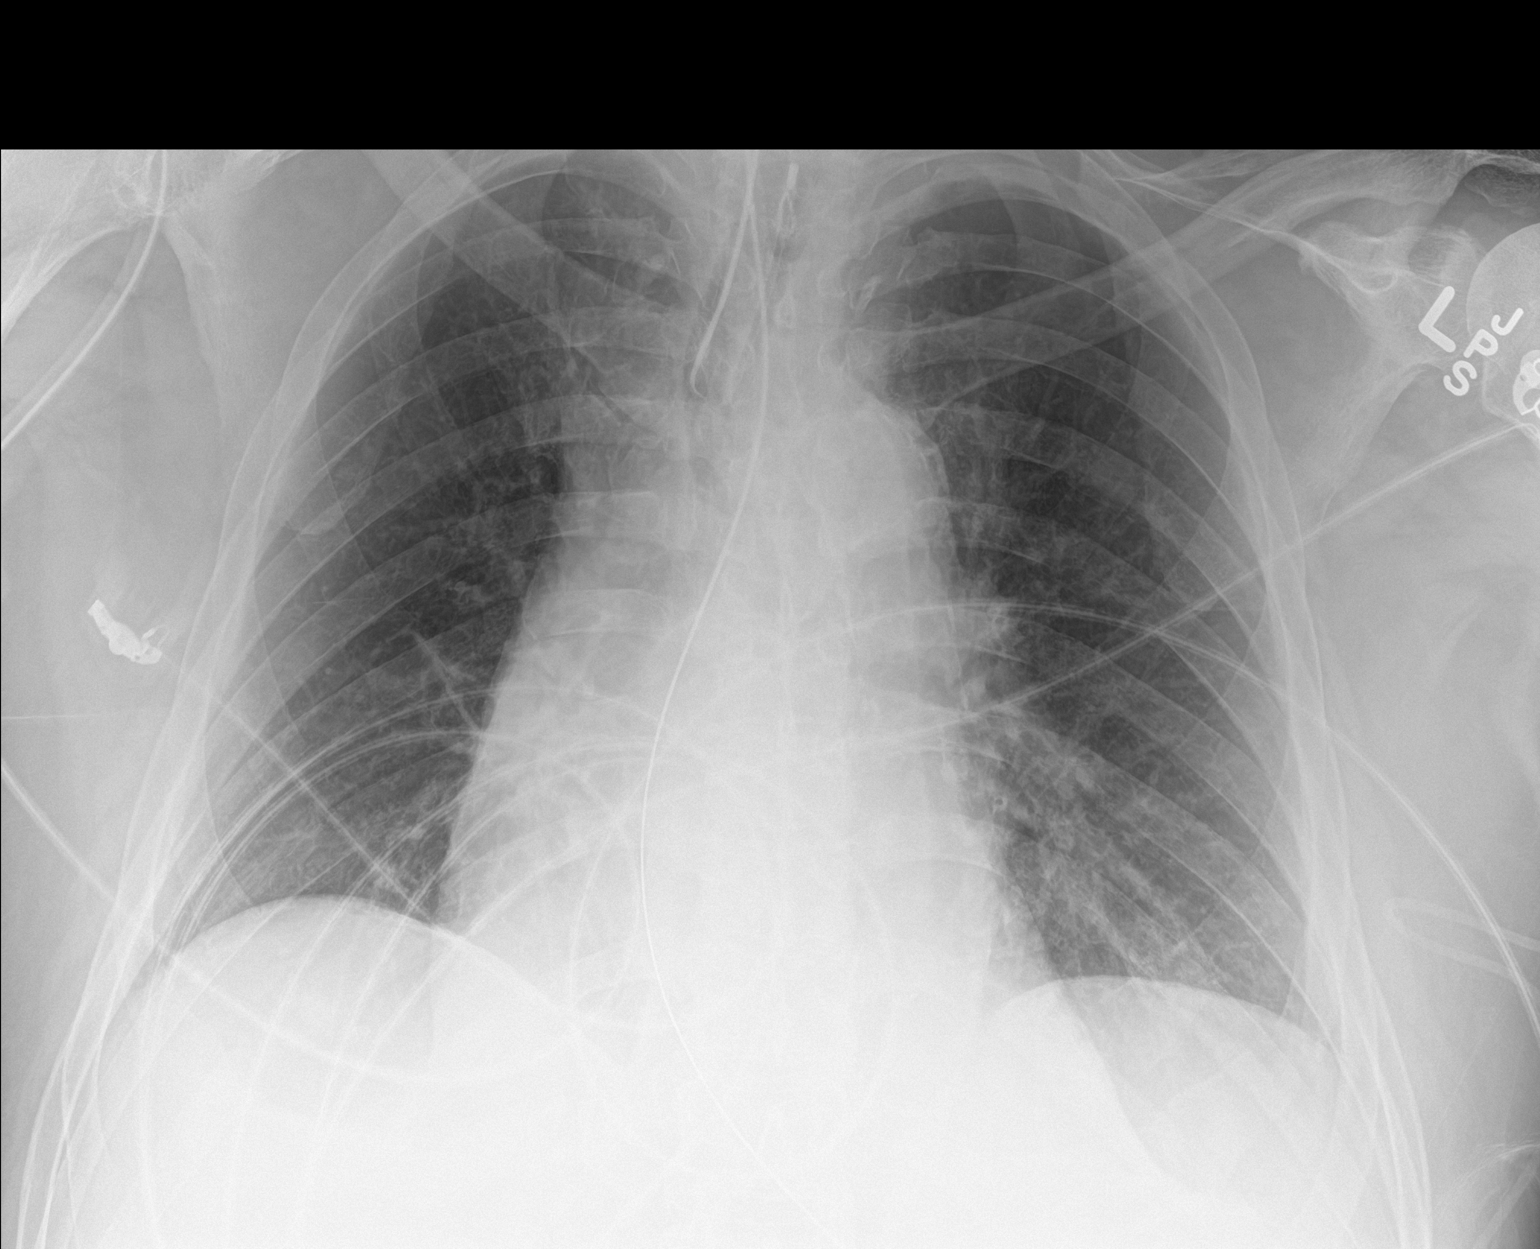

[1 of 1 positions shown; findings below may reference images not displayed]

FINDINGS: Endotracheal tube tip is 5.1 cm above the carina. Nasogastric tube
tip and side port are below the diaphragm. No pneumothorax.

There is no edema or consolidation. Heart size and pulmonary
vascularity are normal. No adenopathy. There is aortic
atherosclerosis. No evident bone lesions.
IMPRESSION: Tube positions as described without evident pneumothorax. No edema
or consolidation. There is aortic atherosclerosis.

Aortic Atherosclerosis ([id number]).

## 2018-10-06 IMAGING — DX DG CHEST 1V PORT
1 series · 1 of 1 positions shown · non-contrast
Comparison: Yesterday

CLINICAL DATA: Acute respiratory failure

EXAM:
PORTABLE CHEST 1 VIEW

[chest ap]
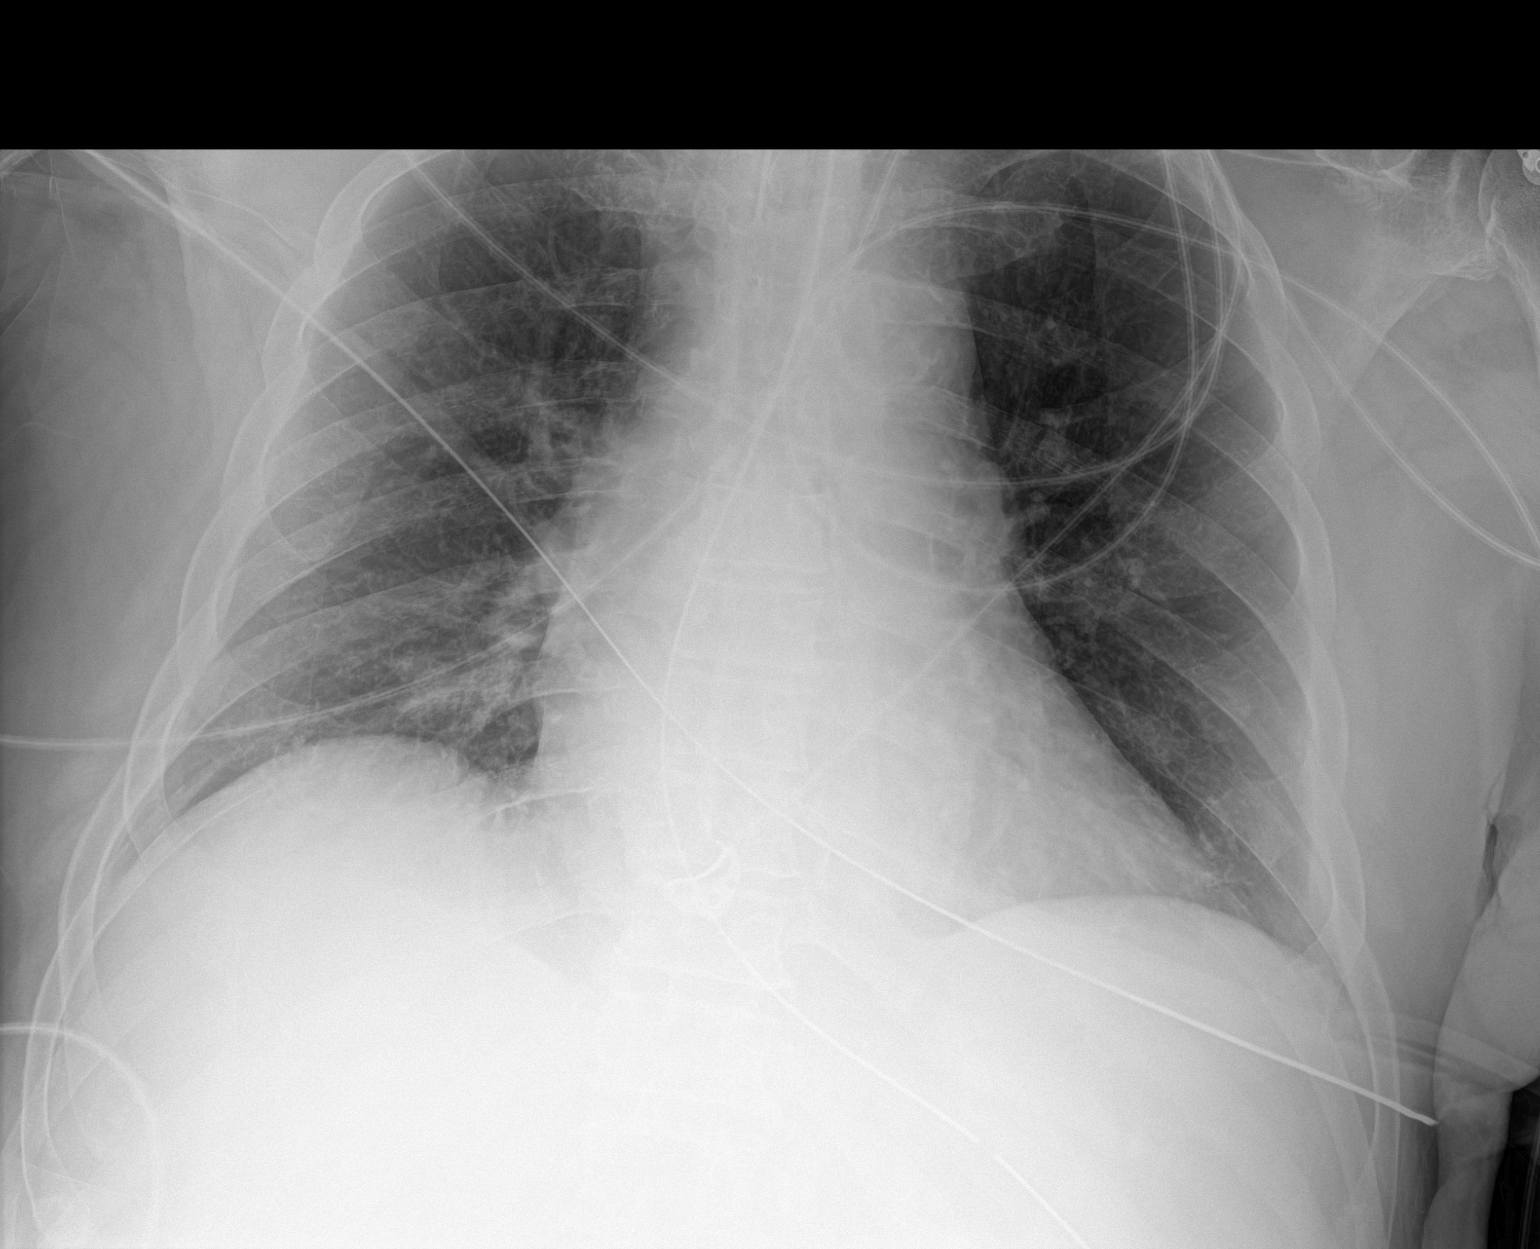

[1 of 1 positions shown; findings below may reference images not displayed]

FINDINGS: Endotracheal tube tip just below the clavicular heads. An orogastric
tube reaches the stomach. Mild interstitial coarsening. No edema,
effusion, or pneumothorax. Normal heart size.
IMPRESSION: 1. Stable positioning of endotracheal and orogastric tubes.
2. Stable inflation.

## 2018-10-09 IMAGING — DX DG ABD PORTABLE 1V
1 series · 1 of 1 positions shown · non-contrast
Comparison: KUB of 10/28/2017

CLINICAL DATA: Orogastric tube placement

EXAM:
PORTABLE ABDOMEN - 1 VIEW

[abdomen]
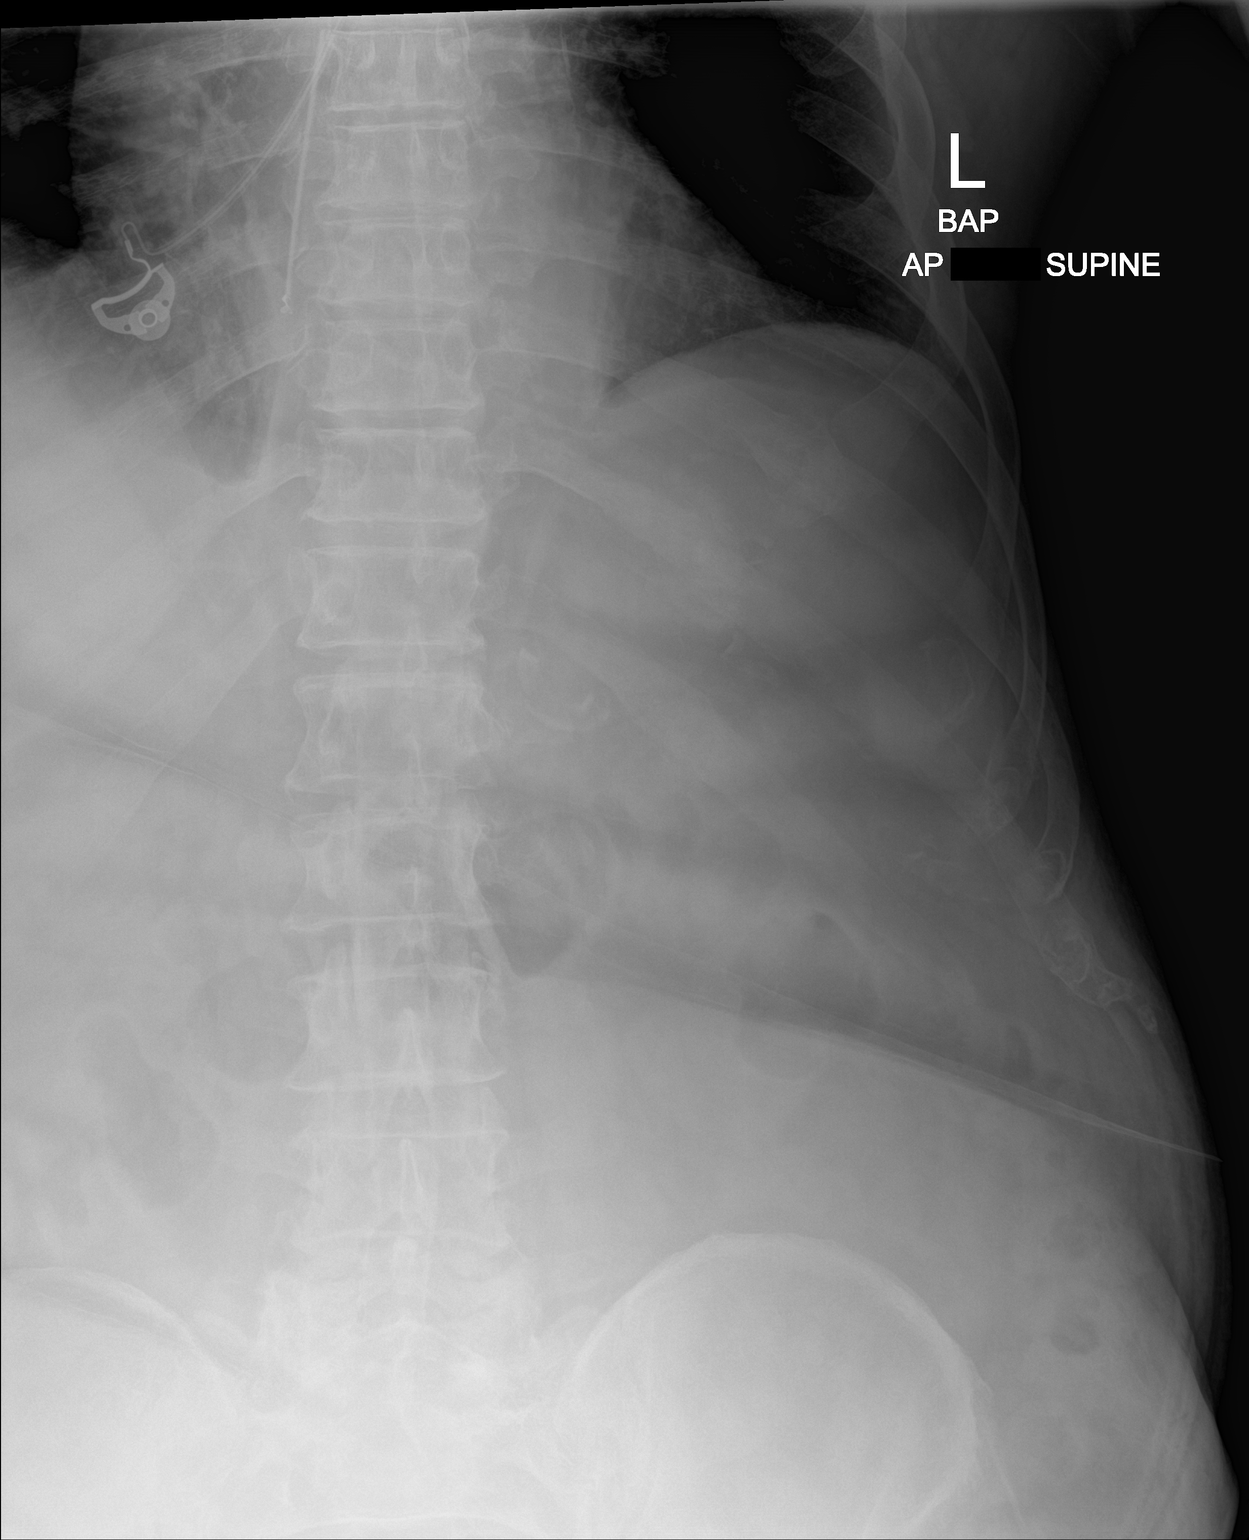

[1 of 1 positions shown; findings below may reference images not displayed]

FINDINGS: The tip of the OG tube overlies the region of the distal esophagus
and could be advanced approximately 15-20 cm. The bowel gas pattern
is nonspecific. No opaque calculi are seen.
IMPRESSION: OG tube tip overlies the distal esophagus and needs to be advanced.
